# Patient Record
Sex: Male | Born: 1935 | Race: White | Hispanic: No | Marital: Married | Smoking: Never smoker
Health system: Southern US, Community
[De-identification: ages and names within clinical notes are randomized; demographics above are authoritative.]

## PROBLEM LIST (undated history)

## (undated) DIAGNOSIS — J189 Pneumonia, unspecified organism: Secondary | ICD-10-CM

## (undated) DIAGNOSIS — R079 Chest pain, unspecified: Secondary | ICD-10-CM

## (undated) DIAGNOSIS — E785 Hyperlipidemia, unspecified: Secondary | ICD-10-CM

## (undated) DIAGNOSIS — I480 Paroxysmal atrial fibrillation: Secondary | ICD-10-CM

## (undated) DIAGNOSIS — F419 Anxiety disorder, unspecified: Secondary | ICD-10-CM

## (undated) DIAGNOSIS — Z950 Presence of cardiac pacemaker: Secondary | ICD-10-CM

## (undated) DIAGNOSIS — M8580 Other specified disorders of bone density and structure, unspecified site: Secondary | ICD-10-CM

## (undated) DIAGNOSIS — I499 Cardiac arrhythmia, unspecified: Secondary | ICD-10-CM

## (undated) DIAGNOSIS — I2510 Atherosclerotic heart disease of native coronary artery without angina pectoris: Secondary | ICD-10-CM

## (undated) DIAGNOSIS — Z87442 Personal history of urinary calculi: Secondary | ICD-10-CM

## (undated) DIAGNOSIS — N400 Enlarged prostate without lower urinary tract symptoms: Secondary | ICD-10-CM

## (undated) DIAGNOSIS — I209 Angina pectoris, unspecified: Secondary | ICD-10-CM

## (undated) DIAGNOSIS — F32A Depression, unspecified: Secondary | ICD-10-CM

## (undated) HISTORY — DX: Other specified disorders of bone density and structure, unspecified site: M85.80

## (undated) HISTORY — DX: Chest pain, unspecified: R07.9

## (undated) HISTORY — DX: Atherosclerotic heart disease of native coronary artery without angina pectoris: I25.10

## (undated) HISTORY — PX: COLON SURGERY: SHX602

## (undated) HISTORY — DX: Benign prostatic hyperplasia without lower urinary tract symptoms: N40.0

## (undated) HISTORY — DX: Hyperlipidemia, unspecified: E78.5

## (undated) HISTORY — DX: Paroxysmal atrial fibrillation: I48.0

## (undated) HISTORY — PX: TONSILLECTOMY: SUR1361

## (undated) HISTORY — DX: Anxiety disorder, unspecified: F41.9

## (undated) HISTORY — PX: HERNIA REPAIR: SHX51

---

## 1998-12-28 ENCOUNTER — Encounter (INDEPENDENT_AMBULATORY_CARE_PROVIDER_SITE_OTHER): Payer: Self-pay | Admitting: Specialist

## 1998-12-28 ENCOUNTER — Ambulatory Visit (HOSPITAL_COMMUNITY): Admission: RE | Admit: 1998-12-28 | Discharge: 1998-12-28 | Payer: Self-pay | Admitting: Gastroenterology

## 1999-05-15 ENCOUNTER — Ambulatory Visit (HOSPITAL_COMMUNITY): Admission: RE | Admit: 1999-05-15 | Discharge: 1999-05-15 | Payer: Self-pay | Admitting: Gastroenterology

## 1999-05-15 ENCOUNTER — Encounter (INDEPENDENT_AMBULATORY_CARE_PROVIDER_SITE_OTHER): Payer: Self-pay | Admitting: *Deleted

## 2001-07-01 ENCOUNTER — Emergency Department (HOSPITAL_COMMUNITY): Admission: EM | Admit: 2001-07-01 | Discharge: 2001-07-02 | Payer: Self-pay | Admitting: Emergency Medicine

## 2002-05-24 ENCOUNTER — Encounter: Admission: RE | Admit: 2002-05-24 | Discharge: 2002-05-24 | Payer: Self-pay | Admitting: Orthopedic Surgery

## 2002-05-24 ENCOUNTER — Encounter: Payer: Self-pay | Admitting: Orthopedic Surgery

## 2004-11-16 ENCOUNTER — Emergency Department (HOSPITAL_COMMUNITY): Admission: EM | Admit: 2004-11-16 | Discharge: 2004-11-16 | Payer: Self-pay | Admitting: Emergency Medicine

## 2010-01-10 ENCOUNTER — Ambulatory Visit: Payer: Self-pay | Admitting: Sports Medicine

## 2010-01-10 DIAGNOSIS — S838X9A Sprain of other specified parts of unspecified knee, initial encounter: Secondary | ICD-10-CM | POA: Insufficient documentation

## 2010-01-10 DIAGNOSIS — S86819A Strain of other muscle(s) and tendon(s) at lower leg level, unspecified leg, initial encounter: Secondary | ICD-10-CM

## 2010-01-10 DIAGNOSIS — M2170 Unequal limb length (acquired), unspecified site: Secondary | ICD-10-CM | POA: Insufficient documentation

## 2010-02-26 ENCOUNTER — Ambulatory Visit: Payer: Self-pay | Admitting: Sports Medicine

## 2010-04-15 ENCOUNTER — Ambulatory Visit: Payer: Self-pay | Admitting: Sports Medicine

## 2010-04-27 ENCOUNTER — Encounter: Admission: RE | Admit: 2010-04-27 | Discharge: 2010-04-27 | Payer: Self-pay | Admitting: Orthopedic Surgery

## 2010-06-25 NOTE — Assessment & Plan Note (Signed)
Summary: NP,CALF PAIN,RUNNER,MC   Vital Signs:  Patient profile:   76 year old male Height:      70 inches Weight:      152 pounds BMI:     21.89 BP sitting:   109 / 52  Vitals Entered By: Lillia Pauls CMA (January 10, 2010 2:52 PM)  History of Present Illness: Derrick Compton is a 76 year old male runner who started to train for the [facility] marathon in 05/2009. He complains of calf pain in the left lower calf since February. He desribes the pain as throbbing, that begins after running for about 2 miles. He has no pain with walking, or using the elliptical. He desribes two instances in which he felt a "pop" in the lower left calf area, which was associated with pain that eventually improved. He has begun doing calf raises, at the recommendation of his coach. He is currently wearing hard insoles in his running shoes.  Derrick Compton has a recent history of plantar fasciitis, which is healed, and has never had any foot or ankle injuries. He has run 12 marathons in his life.  Derrick Compton has a long history of dyslipidemia, for which he takes a statin.  Allergies (verified): No Known Drug Allergies  Physical Exam  General:  Well-developed,well-nourished,in no acute distress; alert,appropriate and cooperative throughout examination Msk:  Ankle: Right: No tenderness to palpation. Ligaments without significant laxity. Negative talar tilt. 5/5 dorsiflexion, plantarflexion, eversion and inversion. Right: No tenderness to palpation. Ligaments without significant laxity. Negative talar tilt. 5/5 dorsiflexion, plantarflexion, eversion and inversion.  Calf: Left calf with some mild tenderness to palpation over the medial lower-calf. Patient does not note pain with standing plantarflexion with knees straight or bent. He does exhibit significant weakness while doing left sided heel raises, especially with the knee bent.  Right leg is 1cm longer than left. Additional Exam:  Calf Ultrasound:  Left: No areas of  signficant tear identified. Doppler images show area of atherosclerotic change (calcification) in the artery near his pain with an area in which the vessel has split. There is a discrete area of abnormal vessel appearance and possible aneurysmal change.  Normal achilles tendon.  Right: No areas of calf muscle tearing identified. Normal achilles tendon.  Images saved.   Impression & Recommendations:  Problem # 1:  MUSCLE STRAIN, LEFT CALF (ICD-844.8)  Derrick Compton has a history of calf pain while running that is consistent with ultrasound findings of some muscle strain with arterial injury. This may be related to his leg length inequality. We will try him on a calf wrap while running to help relieve the pain. We will also start him on a program of calf strengthening exercises, as he has a significant weakness in the left calf, which must be addressed before he begins to increase his milage.  Use compression over area of injury - given body helix  Orders: [place] LIMITED ([id number]) Garment,belt,sleeve or other covering ,elastic or similar stretch (U0454)  Problem # 2:  UNEQUAL LEG LENGTH (ICD-736.81)  Derrick Compton has his right leg 1cm shorter than his left. The torque created by this inequality while running has likely contributed to his calf injury. We will give him sports insoles to support his relatively high arches and will give a wedge in the left shoe to correct for the leg length inequality.  Orders: Sports Insoles [id number])  Patient Instructions: 1)  do the calf raises with knee straight and knee bent 2)  go down slow and  up steady but more quickly 3)  your left leg is too weak to come up on one leg for first week - so come up on both legs 4)  You have a tear in the bkood vessel going into the medial calf muscle 5)  use a wrap over calf to protect this when exercising 6)  use sports insoles with the lift on left leg 7)  recheck with [place] in 6 weeks 8)  After 1 week of exercise - start some  easy running 9)  run 8 mins and walk 2 mins x 3 for 2 weeks 10)  then run 15 mins and walk 2 mins x 3 for 2 weeks  11)  then come see [place]

## 2010-06-25 NOTE — Assessment & Plan Note (Signed)
Summary: F/U CALF,MC   Vital Signs:  Patient profile:   75 year old male BP sitting:   110 / 63  Vitals Entered By: Lillia Pauls CMA (February 26, 2010 11:01 AM)  History of Present Illness: f/u Calf pain: Left calf pain not much improved since last visit.  Has done calf stretching the strength exercises as directed. Also used calf compression sleves which have not helped much. Continues to experience pain with running.  Continues to use mobic occasionally.  Is frustrated that the calf is not improved by now.   Current Problems (verified): 1)  Unequal Leg Length  (ICD-736.81) 2)  Muscle Strain, Left Calf  (ICD-844.8)  Current Medications (verified): 1)  Nitroglycerin 0.2 Mg/hr Pt24 (Nitroglycerin) .... 1/4 Patch X 24 Hours Once Daily As Directed To Calf  Allergies (verified): No Known Drug Allergies  Review of Systems  The patient denies anorexia, fever, weight loss, chest pain, and abdominal pain.    Physical Exam  General:  VS noted. Well male in NAD Msk:  Ankle: Right: No tenderness to palpation. Ligaments without significant laxity. Negative talar tilt. 5/5 dorsiflexion, plantarflexion, eversion and inversion. Right: No tenderness to palpation. Ligaments without significant laxity. Negative talar tilt. 5/5 dorsiflexion, plantarflexion, eversion and inversion.  Calf: Left calf.  Notes a firm nodule and fiber irregurality along the musclo-tendon juction of the lateral gastroc of the left LE. Is mildly tender to palpation and this is the tender area with exercise.   Right leg is 1cm longer than left.   Impression & Recommendations:  Problem # 1:  MUSCLE STRAIN, LEFT CALF (ICD-844.8) Think nodule may be calcified AVM seen on ukltrasound at last visit. This may be a difficult lesion to heal fully.  Not better with usual conservative therapy.  Plan to try nitroglycerin patch protocal. Will continue exercises as directed at the last visit.  Will follow up in 4-6 weeks.  If  not improved will re-scan then.  Also will consider referral to surgon. May benifit from removal of calcified vessle.  Patient voices understanding.   Complete Medication List: 1)  Nitroglycerin 0.2 Mg/hr Pt24 (Nitroglycerin) .... 1/4 patch x 24 hours once daily as directed to calf Prescriptions: NITROGLYCERIN 0.2 MG/HR PT24 (NITROGLYCERIN) 1/4 patch x 24 hours once daily as directed to calf  #30 x 1   Entered by:   Lillia Pauls CMA   Authorized by:   Enid Baas MD   Signed by:   Lillia Pauls CMA on 02/26/2010   Method used:   Electronically to        [id number] (retail)       [address]       [place], [place]  [address]       Ph: [phone] or [phone]       Fax: [phone]   RxID:   [id number]

## 2010-06-25 NOTE — Assessment & Plan Note (Signed)
Summary: F/U,MC   Vital Signs:  Patient profile:   76 year old male BP sitting:   114 / 60  Vitals Entered By: Rochele Pages RN (April 15, 2010 11:10 AM)  History of Present Illness: 76 y/o M who was training to a marathon and started having some Rt leg pain. An AVM was noted on [place] in a prior visit and the pt was placed on Nitro patches. Today he is still having some pain but it is not in the same area. The pain is now behind the knee and in the upper calf muscle but not on the lateral side. 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 He is now running for 30 min with out pain but says that when he plays golf (not walking the course) he has pain at night when he gets home. Wonders if it's the swinging action from the gold that's causing the problem.   Current Medications (verified): 1)  Meloxicam 7.5 Mg Tabs (Meloxicam) .... Take 1 Pill Daily For Leg Pain and Inflammation.  Allergies (verified): No Known Drug Allergies  Social History: has run many marathons in the past  Physical Exam  General:  Well-developed,well-nourished,in no acute distress; alert,appropriate and cooperative throughout examination Msk:  great flexability,  knee exam shows no effusion; stable ligaments; negative Mcmurray's and provocative meniscal tests; non painful patellar compression; patellar and quadriceps tendons unremarkable.8:54 PM  very slight bulge in the posterior knee.  AT is nontender palpable mass in lower calf has resolved  MSK US  done from achilles to knee. The AVM is resolved, there is no fluid behind his knee. All muscles and tendons appear normal.    Impression & Recommendations:  Problem # 1:  MUSCLE STRAIN, LEFT CALF (ICD-844.8) Assessment Improved  Pt is running 30 min working with [employer] (trainer). Has some posterior knee pain. US done today shows no abnormality. Will ask Thad to do some hamstring strength testing with him. AVM resolved. Pt placed on Mobic 7.5  qDay for pain and inflammation. RTC as needed  will D/C his NTG  Orders: [place] LIMITED ([id number])  Complete Medication List: 1)  Meloxicam 7.5 Mg Tabs (Meloxicam) .... Take 1 pill daily for leg pain and inflammation.  Patient Instructions: 1)  Have Thad check your hamstrings in both the Rt and Left leg for strength.  2)  Your calcified blood vessel is healed so you can stop the nitroglycerine patch.  Prescriptions: MELOXICAM 7.5 MG TABS (MELOXICAM) take 1 pill daily for leg pain and inflammation.  #31 x 3   Entered by:   Jamie Brookes MD   Authorized by:   Enid Baas MD   Signed by:   Jamie Brookes MD on 04/15/2010   Method used:   Electronically to        [id number] (retail)       [address]       [place], [place]  [address]       Ph: [phone] or [phone]       Fax: [phone]   RxID:   [id number]    Orders Added: 1)  Est. Patient Level III [[address]] 2)  [place] LIMITED [[id number]]

## 2010-10-11 NOTE — H&P (Signed)
NAMEMIKHI, ATHEY NO.:  [id number]   MEDICAL RECORD NO.:  [id number]          PATIENT TYPE:  EMS   LOCATION:  ED                           FACILITY:  [facility]   PHYSICIAN:  Maretta Bees. Vonita Moss, M.D.DATE OF BIRTH:  1935/02/07   DATE OF ADMISSION:  11/16/2004  DATE OF DISCHARGE:                                HISTORY & PHYSICAL   HISTORY:  This 76 year old gentleman called me saying that he is having  unrelieved pain from oxycodone.  He saw Dr. Etta Grandchild earlier in the week and a  CT scan showed a 4-mm ureteral stone.  He also has a past history of  prostatitis.  He also has chronic bladder outlet obstructive symptoms but is  not under any therapy for them.  He denied hematuria or fever.  I told him  he needed to go to the emergency room and as it turned out I was in the  emergency room, and he was calling me from the waiting room but never told  me, but I ran into him by chance.   He is in general good health.  He takes Lipitor for hypercholesterolemia.  He takes lorazepam p.r.n. panic attacks.  He has no diabetes or  hypertension.  No cardiac disease.  He has oxycodone for pain with  instructions to take 5 mg every four hours.  He also was on some antibiotics  earlier this week, per Dr. Etta Grandchild.   PHYSICAL EXAMINATION:  GENERAL:  He is alert and oriented in mild distress,  appears stated age.  SKIN:  Warm and dry.  ABDOMEN:  No CVA or bladder tenderness.  No hepatosplenomegaly.   IMPRESSION:  1.  Left ureteral calculus.  He was given 60 mg of Toradol and 8 mg of      morphine IM and sent home with a prescription for more oxycodone to use      5-10 mg every three hours p.r.n. pain.  I also gave him a prescription      for Flomax to help him see if he can pass his stone.  He will return to      see Dr. Etta Grandchild as planned.  2.  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  3.  No fever or dysuria worrisome for acute prostatitis.     _________________    LJP/MEDQ  D:  11/16/2004  T:  11/16/2004  Job:  [id number]

## 2011-02-04 ENCOUNTER — Emergency Department (HOSPITAL_COMMUNITY): Payer: Medicare Other

## 2011-02-04 ENCOUNTER — Inpatient Hospital Stay (HOSPITAL_COMMUNITY)
Admission: EM | Admit: 2011-02-04 | Discharge: 2011-02-11 | DRG: 390 | Disposition: A | Discharge status: Home / Self Care | Payer: Medicare Other | Attending: Internal Medicine | Admitting: Internal Medicine

## 2011-02-04 DIAGNOSIS — K5900 Constipation, unspecified: Secondary | ICD-10-CM | POA: Diagnosis present

## 2011-02-04 DIAGNOSIS — T380X5A Adverse effect of glucocorticoids and synthetic analogues, initial encounter: Secondary | ICD-10-CM | POA: Diagnosis present

## 2011-02-04 DIAGNOSIS — Z85038 Personal history of other malignant neoplasm of large intestine: Secondary | ICD-10-CM

## 2011-02-04 DIAGNOSIS — R109 Unspecified abdominal pain: Secondary | ICD-10-CM | POA: Diagnosis present

## 2011-02-04 DIAGNOSIS — E876 Hypokalemia: Secondary | ICD-10-CM | POA: Diagnosis not present

## 2011-02-04 DIAGNOSIS — I4891 Unspecified atrial fibrillation: Secondary | ICD-10-CM | POA: Diagnosis present

## 2011-02-04 DIAGNOSIS — K644 Residual hemorrhoidal skin tags: Secondary | ICD-10-CM | POA: Diagnosis present

## 2011-02-04 DIAGNOSIS — K648 Other hemorrhoids: Secondary | ICD-10-CM | POA: Diagnosis present

## 2011-02-04 DIAGNOSIS — E785 Hyperlipidemia, unspecified: Secondary | ICD-10-CM | POA: Diagnosis present

## 2011-02-04 DIAGNOSIS — Z9049 Acquired absence of other specified parts of digestive tract: Secondary | ICD-10-CM

## 2011-02-04 DIAGNOSIS — R112 Nausea with vomiting, unspecified: Secondary | ICD-10-CM | POA: Diagnosis present

## 2011-02-04 DIAGNOSIS — D72829 Elevated white blood cell count, unspecified: Secondary | ICD-10-CM | POA: Diagnosis present

## 2011-02-04 DIAGNOSIS — Z87442 Personal history of urinary calculi: Secondary | ICD-10-CM

## 2011-02-04 DIAGNOSIS — K56609 Unspecified intestinal obstruction, unspecified as to partial versus complete obstruction: Secondary | ICD-10-CM

## 2011-02-04 DIAGNOSIS — R079 Chest pain, unspecified: Secondary | ICD-10-CM | POA: Diagnosis present

## 2011-02-04 LAB — DIFFERENTIAL
Basophils Absolute: 0 10*3/uL (ref 0.0–0.1)
Basophils Relative: 0 % (ref 0–1)
Eosinophils Absolute: 0 10*3/uL (ref 0.0–0.7)
Eosinophils Relative: 0 % (ref 0–5)
Lymphocytes Relative: 9 % — ABNORMAL LOW (ref 12–46)
Lymphs Abs: 1.2 10*3/uL (ref 0.7–4.0)
Monocytes Absolute: 1.1 10*3/uL — ABNORMAL HIGH (ref 0.1–1.0)
Monocytes Relative: 8 % (ref 3–12)
Neutro Abs: 12.1 10*3/uL — ABNORMAL HIGH (ref 1.7–7.7)
Neutrophils Relative %: 83 % — ABNORMAL HIGH (ref 43–77)

## 2011-02-04 LAB — URINALYSIS, ROUTINE W REFLEX MICROSCOPIC
Bilirubin Urine: NEGATIVE
Glucose, UA: NEGATIVE mg/dL
Hgb urine dipstick: NEGATIVE
Ketones, ur: NEGATIVE mg/dL
Leukocytes, UA: NEGATIVE
Nitrite: NEGATIVE
Protein, ur: NEGATIVE mg/dL
Specific Gravity, Urine: 1.02 (ref 1.005–1.030)
Urobilinogen, UA: 0.2 mg/dL (ref 0.0–1.0)
pH: 6.5 (ref 5.0–8.0)

## 2011-02-04 LAB — LIPID PANEL
Cholesterol: 196 mg/dL (ref 0–200)
HDL: 63 mg/dL (ref >39)
LDL Cholesterol: 113 mg/dL — ABNORMAL HIGH (ref 0–99)
Total CHOL/HDL Ratio: 3.1 RATIO
Triglycerides: 99 mg/dL (ref <150)
VLDL: 20 mg/dL (ref 0–40)

## 2011-02-04 LAB — CBC
HCT: 42 % (ref 39.0–52.0)
Platelets: 184 10*3/uL (ref 150–400)
RBC: 4.68 MIL/uL (ref 4.22–5.81)
RDW: 12.9 % (ref 11.5–15.5)
WBC: 14.5 10*3/uL — ABNORMAL HIGH (ref 4.0–10.5)

## 2011-02-04 LAB — COMPREHENSIVE METABOLIC PANEL
ALT: 18 U/L (ref 0–53)
AST: 18 U/L (ref 0–37)
Albumin: 3.8 g/dL (ref 3.5–5.2)
Alkaline Phosphatase: 75 U/L (ref 39–117)
BUN: 16 mg/dL (ref 6–23)
CO2: 30 mEq/L (ref 19–32)
Calcium: 9.3 mg/dL (ref 8.4–10.5)
Chloride: 98 mEq/L (ref 96–112)
Creatinine, Ser: 0.76 mg/dL (ref 0.50–1.35)
GFR calc Af Amer: >60 mL/min (ref >60)
GFR calc non Af Amer: >60 mL/min (ref >60)
Glucose, Bld: 109 mg/dL — ABNORMAL HIGH (ref 70–99)
Potassium: 3.5 mEq/L (ref 3.5–5.1)
Sodium: 136 mEq/L (ref 135–145)
Total Bilirubin: 0.6 mg/dL (ref 0.3–1.2)
Total Protein: 6.9 g/dL (ref 6.0–8.3)

## 2011-02-04 LAB — SAMPLE TO BLOOD BANK

## 2011-02-04 LAB — LIPASE, BLOOD: Lipase: 19 U/L (ref 11–59)

## 2011-02-05 ENCOUNTER — Inpatient Hospital Stay (HOSPITAL_COMMUNITY): Payer: Medicare Other

## 2011-02-05 LAB — BASIC METABOLIC PANEL
Calcium: 9.3 mg/dL (ref 8.4–10.5)
GFR calc non Af Amer: >60 mL/min (ref >60)
Glucose, Bld: 139 mg/dL — ABNORMAL HIGH (ref 70–99)
Sodium: 134 mEq/L — ABNORMAL LOW (ref 135–145)

## 2011-02-05 LAB — DIFFERENTIAL
Basophils Relative: 0 % (ref 0–1)
Eosinophils Absolute: 0 10*3/uL (ref 0.0–0.7)
Eosinophils Relative: 0 % (ref 0–5)
Monocytes Absolute: 0.8 10*3/uL (ref 0.1–1.0)
Monocytes Relative: 8 % (ref 3–12)
Neutro Abs: 8.1 10*3/uL — ABNORMAL HIGH (ref 1.7–7.7)

## 2011-02-05 LAB — CBC
Hemoglobin: 14.5 g/dL (ref 13.0–17.0)
MCH: 31.4 pg (ref 26.0–34.0)
MCHC: 34.4 g/dL (ref 30.0–36.0)

## 2011-02-05 LAB — MRSA PCR SCREENING: MRSA by PCR: NEGATIVE

## 2011-02-06 LAB — BASIC METABOLIC PANEL
BUN: 21 mg/dL (ref 6–23)
CO2: 28 mEq/L (ref 19–32)
Chloride: 99 mEq/L (ref 96–112)
Creatinine, Ser: 0.63 mg/dL (ref 0.50–1.35)
GFR calc Af Amer: >60 mL/min (ref >60)

## 2011-02-07 ENCOUNTER — Inpatient Hospital Stay (HOSPITAL_COMMUNITY): Payer: Medicare Other

## 2011-02-07 ENCOUNTER — Encounter (HOSPITAL_COMMUNITY): Payer: Self-pay | Admitting: Radiology

## 2011-02-07 LAB — BASIC METABOLIC PANEL
BUN: 19 mg/dL (ref 6–23)
CO2: 26 mEq/L (ref 19–32)
Calcium: 8.5 mg/dL (ref 8.4–10.5)
Creatinine, Ser: 0.53 mg/dL (ref 0.50–1.35)
Glucose, Bld: 111 mg/dL — ABNORMAL HIGH (ref 70–99)

## 2011-02-07 LAB — CBC
HCT: 36.9 % — ABNORMAL LOW (ref 39.0–52.0)
Hemoglobin: 12.3 g/dL — ABNORMAL LOW (ref 13.0–17.0)
MCH: 30.7 pg (ref 26.0–34.0)
MCV: 92 fL (ref 78.0–100.0)
RBC: 4.01 MIL/uL — ABNORMAL LOW (ref 4.22–5.81)

## 2011-02-07 MED ORDER — IOHEXOL 300 MG/ML  SOLN
100.0000 mL | Freq: Once | INTRAMUSCULAR | Status: AC | PRN
  Administered 2011-02-07: 100 mL via INTRAVENOUS

## 2011-02-08 LAB — CBC
MCH: 31.7 pg (ref 26.0–34.0)
MCV: 89.2 fL (ref 78.0–100.0)
Platelets: 193 10*3/uL (ref 150–400)
RDW: 12.9 % (ref 11.5–15.5)

## 2011-02-08 LAB — BASIC METABOLIC PANEL
BUN: 12 mg/dL (ref 6–23)
Chloride: 97 mEq/L (ref 96–112)
GFR calc Af Amer: >60 mL/min (ref >60)
Potassium: 2.9 mEq/L — ABNORMAL LOW (ref 3.5–5.1)

## 2011-02-09 LAB — BASIC METABOLIC PANEL
CO2: 28 mEq/L (ref 19–32)
Chloride: 100 mEq/L (ref 96–112)
Creatinine, Ser: 0.65 mg/dL (ref 0.50–1.35)
Sodium: 136 mEq/L (ref 135–145)

## 2011-02-09 LAB — CBC
MCV: 89.2 fL (ref 78.0–100.0)
Platelets: 213 10*3/uL (ref 150–400)
RBC: 4.34 MIL/uL (ref 4.22–5.81)
WBC: 5.6 10*3/uL (ref 4.0–10.5)

## 2011-02-09 LAB — MAGNESIUM: Magnesium: 2.3 mg/dL (ref 1.5–2.5)

## 2011-02-10 LAB — BASIC METABOLIC PANEL
CO2: 28 mEq/L (ref 19–32)
Chloride: 99 mEq/L (ref 96–112)
Potassium: 3.7 mEq/L (ref 3.5–5.1)
Sodium: 137 mEq/L (ref 135–145)

## 2011-02-11 LAB — BASIC METABOLIC PANEL
CO2: 26 mEq/L (ref 19–32)
Glucose, Bld: 108 mg/dL — ABNORMAL HIGH (ref 70–99)
Potassium: 4.3 mEq/L (ref 3.5–5.1)
Sodium: 135 mEq/L (ref 135–145)

## 2011-02-12 NOTE — Op Note (Signed)
  NAMETYLAR, AMBORN NO.:  [id number]  MEDICAL RECORD NO.:  [id number]  LOCATION:  1437                         FACILITY:  [facility]  PHYSICIAN:  Petra Kuba, M.D.    DATE OF BIRTH:  September 08, 1934  DATE OF PROCEDURE: DATE OF DISCHARGE:                              OPERATIVE REPORT   PROCEDURE:  Colonoscopy.  INDICATION:  The patient with a history of colon cancer with questionable obstruction.  Consent was signed after risks, benefits, methods, options thoroughly discussed multiple times in the past.  MEDICINES USED:  Fentanyl 100 mcg, Versed 10 mg, Benadryl 12.5 mg.  PROCEDURE IN DETAIL:  Rectal inspection is pertinent for external hemorrhoids, small.  Digital exam was negative.  The video colonoscope was inserted and despite some tortuosity and a long looping colon, we were able to advance to the anastomosis and into the terminal ileum. There were no signs of blockage, mass, lesions, or abnormalities.  The TI was normal.  We were able to advance between 15 to 20 cm up the terminal ileum.  The scope was slowly withdrawn.  They had a small bowel to colon side anastomosis.  The colon end anastomosis was normal.  The scope was further withdrawn.  The prep was adequate.  There was some liquid stool that required washing and suctioning and slow withdrawal through the colon.  No polyps, tumors, masses, or diverticula were seen as we slowly withdrew back to the rectum.  Anorectal pull-through and retroflexion confirmed some tiny hemorrhoids.  Scope was straightened and re-advanced towards at the left side of the colon.  Air was suctioned.  Scope removed.  The patient tolerated the procedure well. There was no obvious immediate complication.  ENDOSCOPIC DIAGNOSES: 1. Internal-external tiny hemorrhoids. 2. Otherwise within normal limits to the anastomosis and into the     terminal ileum. 3. No signs of obstruction or abnormality.  PLAN:  Follow up with Dr. Matthias Hughs  in 1-2 weeks just to make sure no further workup plans are needed, can go home from my standpoint if able to advance diet.  Repeat colon screening in 5 years from now if doing well medically.          ______________________________ Petra Kuba, M.D.     MEM/MEDQ  D:  02/10/2011  T:  02/10/2011  Job:  [id number]  cc:   Lynelle Smoke I. Patsi Sears, M.D. Fax: [phone]  Elana Alm. Thurston Hole, M.D. Fax: [phone]  Bernette Redbird, M.D. Fax: [phone]  Electronically Signed by Vida Rigger M.D. on 02/12/2011 02:58:07 PM
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
## 2011-02-27 NOTE — H&P (Signed)
  NAMEJEMEL, ONO NO.:  [id number]  MEDICAL RECORD NO.:  [id number]  LOCATION:  WLED                         FACILITY:  [facility]  PHYSICIAN:  Ramiro Harvest, MD    DATE OF BIRTH:  June 29, 1934  DATE OF ADMISSION:  02/04/2011 DATE OF DISCHARGE:                             HISTORY & PHYSICAL   ADDENDUM:  Leukocytosis on the assessment and plan likely secondary to recent steroid use versus reactive.  Urinalysis is negative.  Chest x- ray is negative for any infiltrate.  The patient is afebrile.  No need for antibiotics at this time. We will monitor.  It has been a pleasure taking care of Mr. Ingvald Theisen.     Ramiro Harvest, MD     DT/MEDQ  D:  02/04/2011  T:  02/04/2011  Job:  [id number]  Electronically Signed by Ramiro Harvest MD on 02/27/2011 12:34:53 PM
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
## 2011-05-07 DIAGNOSIS — Z8679 Personal history of other diseases of the circulatory system: Secondary | ICD-10-CM | POA: Insufficient documentation

## 2011-05-07 DIAGNOSIS — Z9889 Other specified postprocedural states: Secondary | ICD-10-CM | POA: Insufficient documentation

## 2011-05-07 DIAGNOSIS — H18519 Endothelial corneal dystrophy, unspecified eye: Secondary | ICD-10-CM | POA: Insufficient documentation

## 2011-12-08 DIAGNOSIS — Z947 Corneal transplant status: Secondary | ICD-10-CM | POA: Insufficient documentation

## 2014-02-28 ENCOUNTER — Encounter (HOSPITAL_BASED_OUTPATIENT_CLINIC_OR_DEPARTMENT_OTHER): Payer: Self-pay | Admitting: Emergency Medicine

## 2014-02-28 ENCOUNTER — Emergency Department (HOSPITAL_BASED_OUTPATIENT_CLINIC_OR_DEPARTMENT_OTHER)
Admission: EM | Admit: 2014-02-28 | Discharge: 2014-02-28 | Discharge status: Against Medical Advice | Payer: Medicare Other | Attending: Emergency Medicine | Admitting: Emergency Medicine

## 2014-02-28 DIAGNOSIS — F419 Anxiety disorder, unspecified: Secondary | ICD-10-CM | POA: Diagnosis present

## 2014-02-28 DIAGNOSIS — R110 Nausea: Secondary | ICD-10-CM | POA: Insufficient documentation

## 2014-02-28 NOTE — ED Notes (Addendum)
Pt c/o " nervousness" and nausea.  X 1 day increased stress at work

## 2014-11-28 DIAGNOSIS — Z87448 Personal history of other diseases of urinary system: Secondary | ICD-10-CM | POA: Insufficient documentation

## 2014-11-28 DIAGNOSIS — N401 Enlarged prostate with lower urinary tract symptoms: Secondary | ICD-10-CM | POA: Insufficient documentation

## 2014-11-28 DIAGNOSIS — N200 Calculus of kidney: Secondary | ICD-10-CM | POA: Insufficient documentation

## 2016-04-23 DIAGNOSIS — F419 Anxiety disorder, unspecified: Secondary | ICD-10-CM | POA: Insufficient documentation

## 2016-04-23 DIAGNOSIS — N400 Enlarged prostate without lower urinary tract symptoms: Secondary | ICD-10-CM | POA: Insufficient documentation

## 2016-04-23 DIAGNOSIS — Z974 Presence of external hearing-aid: Secondary | ICD-10-CM | POA: Insufficient documentation

## 2016-04-23 DIAGNOSIS — H9191 Unspecified hearing loss, right ear: Secondary | ICD-10-CM | POA: Insufficient documentation

## 2016-10-13 ENCOUNTER — Telehealth: Payer: Self-pay | Admitting: Cardiovascular Disease

## 2016-10-13 NOTE — Telephone Encounter (Signed)
Received records from [facility] for appointment on 10/28/16 with Dr Gwenlyn Found.  Records put with Dr Kennon Holter schedule for 10/28/16. lp

## 2016-10-22 DIAGNOSIS — E785 Hyperlipidemia, unspecified: Secondary | ICD-10-CM | POA: Insufficient documentation

## 2016-10-22 DIAGNOSIS — H903 Sensorineural hearing loss, bilateral: Secondary | ICD-10-CM | POA: Insufficient documentation

## 2016-10-22 DIAGNOSIS — C189 Malignant neoplasm of colon, unspecified: Secondary | ICD-10-CM | POA: Insufficient documentation

## 2016-10-28 ENCOUNTER — Ambulatory Visit (INDEPENDENT_AMBULATORY_CARE_PROVIDER_SITE_OTHER): Payer: Medicare Other | Admitting: Cardiovascular Disease

## 2016-10-28 ENCOUNTER — Encounter: Payer: Self-pay | Admitting: Cardiovascular Disease

## 2016-10-28 VITALS — BP 116/62 | HR 88 | Ht 70.0 in | Wt 156.0 lb

## 2016-10-28 DIAGNOSIS — I209 Angina pectoris, unspecified: Secondary | ICD-10-CM

## 2016-10-28 DIAGNOSIS — R0789 Other chest pain: Secondary | ICD-10-CM

## 2016-10-28 DIAGNOSIS — R079 Chest pain, unspecified: Secondary | ICD-10-CM | POA: Insufficient documentation

## 2016-10-28 DIAGNOSIS — E7800 Pure hypercholesterolemia, unspecified: Secondary | ICD-10-CM

## 2016-10-28 DIAGNOSIS — I208 Other forms of angina pectoris: Secondary | ICD-10-CM

## 2016-10-28 NOTE — Progress Notes (Signed)
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 He was referred by the [facility] clinic for cardiovascular evaluation because of proximity and progressive chest pain. This with Dr. really is hyperlipidemia. He did have a coronary calcium score performed 10/04/15 which was 181 a subsequent GXT which was normal. He exercises frequently has noticed progressive substernal chest pain with running.   Current Outpatient Prescriptions  Medication Sig Dispense Refill  . ALPRAZolam (XANAX) 0.5 MG tablet Take 0.5 mg by mouth at bedtime as needed.  1  . LORazepam (ATIVAN) 0.5 MG tablet Take 0.5 mg by mouth every 8 (eight) hours.    . meclizine (ANTIVERT) 25 MG tablet Take 25 mg by mouth as needed.  0  . rosuvastatin (CRESTOR) 40 MG tablet Take 0.5 tablets by mouth daily.  3  . sertraline (ZOLOFT) 100 MG tablet Take 100 mg by mouth daily.  1  . valACYclovir (VALTREX) 1000 MG tablet Take 1,000 mg by mouth daily.  5   No current facility-administered medications for this visit.     Allergies  Allergen Reactions  . Prednisolone Palpitations    Social History   Social History  . Marital status: Married    Spouse name: N/A  . Number of children: N/A  . Years of education: N/A   Occupational History  . Not on file.   Social History Main Topics  . Smoking status: Never Smoker  . Smokeless tobacco: Never Used  . Alcohol use No  . Drug use: No  . Sexual activity: Not on file   Other Topics Concern  . Not on file   Social History Narrative  . No narrative on file     Review of Systems: General: negative for chills, fever, night sweats or weight changes.    Cardiovascular: negative for chest pain, dyspnea on exertion, edema, orthopnea, palpitations, paroxysmal nocturnal dyspnea or shortness of breath Dermatological: negative for rash Respiratory: negative for cough or wheezing Urologic: negative for hematuria Abdominal: negative for nausea, vomiting, diarrhea, bright red blood per rectum, melena, or hematemesis Neurologic: negative for visual changes, syncope, or dizziness All other systems reviewed and are otherwise negative except as noted above.    Blood pressure 116/62, pulse 88, height 5\' 10"  (1.778 m), weight 156 lb (70.8 kg), SpO2 97 %.  General appearance: alert and no distress Neck: no adenopathy, no carotid bruit, no JVD, supple, symmetrical, trachea midline and thyroid not enlarged, symmetric, no tenderness/mass/nodules Lungs: clear to auscultation bilaterally Heart: regular rate and rhythm, S1, S2 normal, no murmur, click, rub or gallop Extremities: extremities normal, atraumatic, no cyanosis or edema  EKG not performed today  ASSESSMENT AND PLAN:   Hyperlipidemia History of hyperlipidemia on statin therapy with lipid profile performed 10/04/15  with total cholesterol of 198, LDL 118 and HDL of 61.  Chest pain Derrick Compton is referred for evaluation of progressive exertional chest pain. He did have a coronary calcium score performed at the [place] clinic on 10/04/15 of 181. A subsequent routine GXT was normal. He does exercise frequently has noted progressive substernal chest pain with running which gradually subsides on its own. Based on this I'm going to order an exercise Myoview stress test to  risk stratify him.      Lorretta Harp MD FACP,FACC,FAHA, [facility] 10/28/2016 9:03 AM

## 2016-10-28 NOTE — Assessment & Plan Note (Signed)
Derrick Compton is referred for evaluation of progressive exertional chest pain. He did have a coronary calcium score performed at the [facility] on 10/04/15 of 181. A subsequent routine GXT was normal. He does exercise frequently has noted progressive substernal chest pain with running which gradually subsides on its own. Based on this I'm going to order an exercise Myoview stress test to risk stratify him.

## 2016-10-28 NOTE — Patient Instructions (Signed)
Medication Instructions: Your physician recommends that you continue on your current medications as directed. Please refer to the Current Medication list given to you today.   Testing/Procedures: Your physician has requested that you have an exercise stress myoview. For further information please visit [email or web address]. Please follow instruction sheet, as given.  Follow-Up: Your physician wants you to follow-up in: 6 months with Dr. Gwenlyn Found. You will receive a reminder letter in the mail two months in advance. If you don't receive a letter, please call our office to schedule the follow-up appointment.  If you need a refill on your cardiac medications before your next appointment, please call your pharmacy.    Exercise Stress Electrocardiogram An exercise stress electrocardiogram is a test to check how blood flows to your heart. It is done to find areas of poor blood flow. You will need to walk on a treadmill for this test. The electrocardiogram will record your heartbeat when you are at rest and when you are exercising. What happens before the procedure?  Do not have drinks with caffeine or foods with caffeine for 24 hours before the test, or as told by your doctor. This includes coffee, tea (even decaf tea), sodas, chocolate, and cocoa.  Follow your doctor's instructions about eating and drinking before the test.  Ask your doctor what medicines you should or should not take before the test. Take your medicines with water unless told by your doctor not to.  If you use an inhaler, bring it with you to the test.  Bring a snack to eat after the test.  Do not  smoke for 4 hours before the test.  Do not put lotions, powders, creams, or oils on your chest before the test.  Wear comfortable shoes and clothing. What happens during the procedure?  You will have patches put on your chest. Small areas of your chest may need to be shaved. Wires will be connected to the patches.  Your heart  rate will be watched while you are resting and while you are exercising.  You will walk on the treadmill. The treadmill will slowly get faster to raise your heart rate.  The test will take about 1-2 hours. What happens after the procedure?  Your heart rate and blood pressure will be watched after the test.  You may return to your normal diet, activities, and medicines or as told by your doctor. This information is not intended to replace advice given to you by your health care provider. Make sure you discuss any questions you have with your health care provider. Document Released: 10/29/2007 Document Revised: 01/09/2016 Document Reviewed: 01/17/2013 Elsevier Interactive Patient Education  [employer].

## 2016-10-28 NOTE — Assessment & Plan Note (Signed)
History of hyperlipidemia on statin therapy with lipid profile performed 10/04/15  with total cholesterol of 198, LDL 118 and HDL of 61.

## 2016-11-04 ENCOUNTER — Inpatient Hospital Stay (HOSPITAL_COMMUNITY): Admission: RE | Admit: 2016-11-04 | Payer: Medicare Other | Source: Ambulatory Visit

## 2016-11-06 ENCOUNTER — Telehealth (HOSPITAL_COMMUNITY): Payer: Self-pay

## 2016-11-06 NOTE — Telephone Encounter (Signed)
Encounter complete. 

## 2016-11-11 ENCOUNTER — Ambulatory Visit (HOSPITAL_COMMUNITY)
Admission: RE | Admit: 2016-11-11 | Discharge: 2016-11-11 | Disposition: A | Discharge status: Home / Self Care | Payer: Medicare Other | Source: Ambulatory Visit | Attending: Cardiovascular Disease | Admitting: Cardiovascular Disease

## 2016-11-11 DIAGNOSIS — R0789 Other chest pain: Secondary | ICD-10-CM

## 2016-11-11 DIAGNOSIS — R079 Chest pain, unspecified: Secondary | ICD-10-CM | POA: Diagnosis present

## 2016-11-11 LAB — MYOCARDIAL PERFUSION IMAGING
CHL CUP NUCLEAR SSS: 0
LV sys vol: 71 mL
LVDIAVOL: 148 mL (ref 62–150)
NUC STRESS TID: 1.07
Peak HR: 50 {beats}/min
Rest HR: 39 {beats}/min
SDS: 0
SRS: 0

## 2016-11-11 MED ORDER — REGADENOSON 0.4 MG/5ML IV SOLN
0.4000 mg | Freq: Once | INTRAVENOUS | Status: AC
  Administered 2016-11-11: 0.4 mg via INTRAVENOUS

## 2016-11-11 MED ORDER — TECHNETIUM TC 99M TETROFOSMIN IV KIT
29.7000 | PACK | Freq: Once | INTRAVENOUS | Status: AC | PRN
  Administered 2016-11-11: 29.7 via INTRAVENOUS
  Filled 2016-11-11: qty 30

## 2016-11-11 MED ORDER — TECHNETIUM TC 99M TETROFOSMIN IV KIT
10.6000 | PACK | Freq: Once | INTRAVENOUS | Status: AC | PRN
  Administered 2016-11-11: 10.6 via INTRAVENOUS
  Filled 2016-11-11: qty 11

## 2016-11-16 NOTE — Progress Notes (Signed)
Have him come in to see me next week

## 2016-11-21 ENCOUNTER — Ambulatory Visit (INDEPENDENT_AMBULATORY_CARE_PROVIDER_SITE_OTHER): Payer: Medicare Other | Admitting: Cardiovascular Disease

## 2016-11-21 ENCOUNTER — Encounter: Payer: Self-pay | Admitting: Cardiovascular Disease

## 2016-11-21 DIAGNOSIS — I208 Other forms of angina pectoris: Secondary | ICD-10-CM

## 2016-11-21 DIAGNOSIS — I209 Angina pectoris, unspecified: Secondary | ICD-10-CM

## 2016-11-21 NOTE — Assessment & Plan Note (Signed)
Derrick Compton continues to have reproducible exertional substernal chest pain. He says that he's had "leakage in his heart" in the past. I'm going to get a 2-D echocardiogram" coronary CTA and we'll see him back after that for further evaluation.

## 2016-11-21 NOTE — Progress Notes (Signed)
Derrick Compton continues to have reproducible exertional substernal chest pain. He says that he's had "leakage in his heart" in the past. I'm going to get a 2-D echocardiogram" coronary CTA and we'll see him back after that for further evaluation.

## 2016-11-21 NOTE — Patient Instructions (Signed)
Medication Instructions: Your physician recommends that you continue on your current medications as directed. Please refer to the Current Medication list given to you today.  Labwork: Your physician recommends that you return for lab work--BMET   Testing/Procedures: Your physician has requested that you have an echocardiogram. Echocardiography is a painless test that uses sound waves to create images of your heart. It provides your doctor with information about the size and shape of your heart and how well your heart's chambers and valves are working. This procedure takes approximately one hour. There are no restrictions for this procedure.  Coronary CT Angiography (CTA), is a special type of CT scan that uses a computer to produce multi-dimensional views of major blood vessels throughout the body. In CT angiography, a contrast material is injected through an IV to help visualize the blood vessels   Follow-Up: Your physician recommends that you schedule a follow-up appointment with Dr. Gwenlyn Found after testing.  If you need a refill on your cardiac medications before your next appointment, please call your pharmacy.

## 2016-11-21 NOTE — Addendum Note (Signed)
Addended by: Zebedee Iba on: 11/21/2016 04:28 PM   Modules accepted: Orders

## 2016-11-25 ENCOUNTER — Encounter: Payer: Self-pay | Admitting: Cardiovascular Disease

## 2016-12-04 ENCOUNTER — Other Ambulatory Visit (HOSPITAL_COMMUNITY): Payer: Medicare Other

## 2016-12-09 ENCOUNTER — Ambulatory Visit (HOSPITAL_BASED_OUTPATIENT_CLINIC_OR_DEPARTMENT_OTHER): Payer: Medicare Other

## 2016-12-09 ENCOUNTER — Other Ambulatory Visit: Payer: Medicare Other

## 2016-12-09 ENCOUNTER — Encounter: Payer: Self-pay | Admitting: Cardiovascular Disease

## 2016-12-09 ENCOUNTER — Other Ambulatory Visit: Payer: Self-pay

## 2016-12-09 DIAGNOSIS — I082 Rheumatic disorders of both aortic and tricuspid valves: Secondary | ICD-10-CM | POA: Diagnosis not present

## 2016-12-09 DIAGNOSIS — I208 Other forms of angina pectoris: Secondary | ICD-10-CM

## 2016-12-09 DIAGNOSIS — E785 Hyperlipidemia, unspecified: Secondary | ICD-10-CM | POA: Diagnosis not present

## 2016-12-10 ENCOUNTER — Ambulatory Visit (HOSPITAL_COMMUNITY)
Admission: RE | Admit: 2016-12-10 | Discharge: 2016-12-10 | Disposition: A | Discharge status: Home / Self Care | Payer: Medicare Other | Source: Ambulatory Visit | Attending: Cardiovascular Disease | Admitting: Cardiovascular Disease

## 2016-12-10 DIAGNOSIS — I208 Other forms of angina pectoris: Secondary | ICD-10-CM

## 2016-12-10 DIAGNOSIS — I082 Rheumatic disorders of both aortic and tricuspid valves: Secondary | ICD-10-CM | POA: Insufficient documentation

## 2016-12-10 DIAGNOSIS — E785 Hyperlipidemia, unspecified: Secondary | ICD-10-CM | POA: Insufficient documentation

## 2016-12-10 LAB — BASIC METABOLIC PANEL
BUN/Creatinine Ratio: 19 (ref 10–24)
BUN: 17 mg/dL (ref 8–27)
CHLORIDE: 100 mmol/L (ref 96–106)
CO2: 26 mmol/L (ref 20–29)
Calcium: 9.6 mg/dL (ref 8.6–10.2)
Creatinine, Ser: 0.9 mg/dL (ref 0.76–1.27)
GFR, EST AFRICAN AMERICAN: 94 mL/min/{1.73_m2} (ref >59)
GFR, EST NON AFRICAN AMERICAN: 81 mL/min/{1.73_m2} (ref >59)
Glucose: 101 mg/dL — ABNORMAL HIGH (ref 65–99)
POTASSIUM: 4.3 mmol/L (ref 3.5–5.2)
SODIUM: 142 mmol/L (ref 134–144)

## 2016-12-10 MED ORDER — IOPAMIDOL (ISOVUE-370) INJECTION 76%
INTRAVENOUS | Status: AC
  Filled 2016-12-10: qty 100

## 2016-12-10 MED ORDER — IOPAMIDOL (ISOVUE-300) INJECTION 61%
INTRAVENOUS | Status: AC
  Filled 2016-12-10: qty 75

## 2016-12-11 ENCOUNTER — Other Ambulatory Visit: Payer: Self-pay | Admitting: *Deleted

## 2016-12-11 ENCOUNTER — Encounter: Payer: Self-pay | Admitting: Cardiovascular Disease

## 2016-12-11 DIAGNOSIS — I208 Other forms of angina pectoris: Secondary | ICD-10-CM

## 2016-12-18 ENCOUNTER — Ambulatory Visit (HOSPITAL_COMMUNITY): Payer: Medicare Other

## 2016-12-24 ENCOUNTER — Ambulatory Visit (HOSPITAL_COMMUNITY)
Admission: RE | Admit: 2016-12-24 | Discharge: 2016-12-24 | Disposition: A | Discharge status: Home / Self Care | Payer: Medicare Other | Source: Ambulatory Visit | Attending: Cardiovascular Disease | Admitting: Cardiovascular Disease

## 2016-12-24 ENCOUNTER — Encounter (HOSPITAL_COMMUNITY): Payer: Self-pay

## 2016-12-24 DIAGNOSIS — I208 Other forms of angina pectoris: Secondary | ICD-10-CM

## 2016-12-24 DIAGNOSIS — I2510 Atherosclerotic heart disease of native coronary artery without angina pectoris: Secondary | ICD-10-CM | POA: Diagnosis not present

## 2016-12-24 DIAGNOSIS — R079 Chest pain, unspecified: Secondary | ICD-10-CM | POA: Diagnosis not present

## 2016-12-24 MED ORDER — IOPAMIDOL (ISOVUE-370) INJECTION 76%
INTRAVENOUS | Status: AC
  Administered 2016-12-24: 80 mL
  Filled 2016-12-24: qty 100

## 2016-12-24 MED ORDER — NITROGLYCERIN 0.4 MG SL SUBL: 0.8000 mg | SUBLINGUAL_TABLET | Freq: Once | SUBLINGUAL | Status: DC

## 2016-12-24 MED ORDER — NITROGLYCERIN 0.4 MG SL SUBL
SUBLINGUAL_TABLET | SUBLINGUAL | Status: AC
  Filled 2016-12-24: qty 2

## 2017-01-05 ENCOUNTER — Encounter: Payer: Self-pay | Admitting: Cardiovascular Disease

## 2017-01-16 ENCOUNTER — Encounter: Payer: Self-pay | Admitting: Cardiovascular Disease

## 2017-01-16 ENCOUNTER — Ambulatory Visit (INDEPENDENT_AMBULATORY_CARE_PROVIDER_SITE_OTHER): Payer: Medicare Other | Admitting: Cardiovascular Disease

## 2017-01-16 VITALS — BP 113/60 | HR 60 | Ht 71.0 in | Wt 161.0 lb

## 2017-01-16 DIAGNOSIS — I350 Nonrheumatic aortic (valve) stenosis: Secondary | ICD-10-CM

## 2017-01-16 DIAGNOSIS — I209 Angina pectoris, unspecified: Secondary | ICD-10-CM

## 2017-01-16 DIAGNOSIS — E7800 Pure hypercholesterolemia, unspecified: Secondary | ICD-10-CM | POA: Diagnosis not present

## 2017-01-16 DIAGNOSIS — I208 Other forms of angina pectoris: Secondary | ICD-10-CM

## 2017-01-16 NOTE — Assessment & Plan Note (Signed)
History of hyperlipidemia on statin therapy followed by his PCP 

## 2017-01-16 NOTE — Assessment & Plan Note (Signed)
Derrick Compton she was to have atypical chest pain which has been exertional component. His Myoview stress test was normal over his coronary CTA and CT FFR appeared to show a significant diagonal branch stenosis. Based on this we decided to proceed with outpatient radial diagnostic coronary angiography to further define his anatomy.The patient understands that risks included but are not limited to stroke (1 in 1000), death (1 in 2), kidney failure [usually temporary] (1 in 500), bleeding (1 in 200), allergic reaction [possibly serious] (1 in 200). The patient understands and agrees to proceed

## 2017-01-16 NOTE — Assessment & Plan Note (Signed)
History of mild aortic stenosis with recent echo performed 12/09/16 revealing a mean aortic valve gradient of 11 mmHg with a peak of 21 mm and normal ejection fraction. We will continue to follow on an annual basis.

## 2017-01-16 NOTE — Patient Instructions (Signed)
   [facility] [address] [place] [address] Dept: [phone] Loc: [phone]  Derrick Compton  01/16/2017  You are scheduled for a Cardiac Catheterization on Thursday, September 13 with Dr. Quay Burow.  1. Please arrive at the [facility] (Main Entrance A) at [facility]: [address] [place], [place] [address] at 11:00 AM (two hours before your procedure to ensure your preparation). Free valet parking service is available.   Special note: Every effort is made to have your procedure done on time. Please understand that emergencies sometimes delay scheduled procedures.  2. Diet: Do not eat or drink anything after midnight prior to your procedure except sips of water to take medications.  3. Labs: You will need to have blood drawn on Monday, September 3 in our office. No appointment needed. You do NOT need to be fasting.  4. Medication instructions in preparation for your procedure:  On the morning of your procedure, take any morning medicines.  You may use sips of water.  5. Plan for one night stay--bring personal belongings. 6. Bring a current list of your medications and current insurance cards. 7. You MUST have a responsible person to drive you home. 8. Someone MUST be with you the first 24 hours after you arrive home or your discharge will be delayed. 9. Please wear clothes that are easy to get on and off and wear slip-on shoes.  Thank you for allowing [place] to care for you!   -- [facility]

## 2017-01-16 NOTE — Progress Notes (Signed)
01/16/2017 Derrick Compton   04/11/1935  [id number]  Primary Physician Patient, No Pcp Per Primary Cardiologist: Lorretta Harp MD Renae Gloss  HPI:  Derrick Compton is a 82 y.o. 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 He was referred by the [facility] clinic for cardiovascular evaluation because of proximity and progressive chest pain. I last saw him in the office 11/21/16. His only cardiovascular risk factor is hyperlipidemia. He did have a coronary calcium score performed 10/04/15 which was 181 a subsequent GXT which was normal. He exercises frequently has noticed progressive substernal chest pain with running. I perform Myoview stress testing on 11/11/16 which was entirely normal 2-D echo performed 12/09/16 showed mild aortic stenosis. He studies continued symptoms I ordered a coronary CTA/FFR on 12/24/16 showed a significant lesion in a diagonal branch.    Current Meds  Medication Sig  . ALPRAZolam (XANAX) 0.5 MG tablet Take 0.5 mg by mouth at bedtime as needed.  [name] LORazepam (ATIVAN) 0.5 MG tablet Take 0.5 mg by mouth every 8 (eight) hours.  . meclizine (ANTIVERT) 25 MG tablet Take 25 mg by mouth as needed.  . rosuvastatin (CRESTOR) 40 MG tablet Take 0.5 tablets by mouth daily.  . sertraline (ZOLOFT) 100 MG tablet Take 100 mg by mouth daily.  . valACYclovir (VALTREX) 1000 MG tablet Take 1,000 mg by mouth daily.     No Known Allergies  Social History   Social History  . Marital status: Married    Spouse name: N/A  . Number of children: N/A  . Years of education: N/A   Occupational History  . Not on file.   Social History Main Topics  . Smoking status: Never Smoker  . Smokeless tobacco: Never Used  . Alcohol use No  . Drug use: No  . Sexual activity: Not on file   Other Topics Concern  . Not on file   Social History  Narrative  . No narrative on file     Review of Systems: General: negative for chills, fever, night sweats or weight changes.  Cardiovascular: negative for chest pain, dyspnea on exertion, edema, orthopnea, palpitations, paroxysmal nocturnal dyspnea or shortness of breath Dermatological: negative for rash Respiratory: negative for cough or wheezing Urologic: negative for hematuria Abdominal: negative for nausea, vomiting, diarrhea, bright red blood per rectum, melena, or hematemesis Neurologic: negative for visual changes, syncope, or dizziness All other systems reviewed and are otherwise negative except as noted above.    Blood pressure 113/60, pulse 60, height 5\' 11"  (1.803 m), weight 161 lb (73 kg), SpO2 97 %.  General appearance: alert and no distress Neck: no adenopathy, no carotid bruit, no JVD, supple, symmetrical, trachea midline and thyroid not enlarged, symmetric, no tenderness/mass/nodules Lungs: clear to auscultation bilaterally Heart: Soft outflow tract murmur consistent with aortic stenosis Extremities: Soft outflow tract murmur consistent with aortic stenosis  EKG not performed today  ASSESSMENT AND PLAN:   Hyperlipidemia History of hyperlipidemia on statin therapy followed by his PCP  Aortic stenosis, mild History of mild aortic stenosis with recent echo performed 12/09/16 revealing a mean aortic valve gradient of 11 mmHg with a peak of 21 mm and normal ejection fraction. We will continue to follow on an annual basis.  Chest pain Derrick Compton she was to have atypical chest pain which has been exertional component. His Myoview stress test was normal over  his coronary CTA and CT FFR appeared to show a significant diagonal branch stenosis. Based on this we decided to proceed with outpatient radial diagnostic coronary angiography to further define his anatomy.The patient understands that risks included but are not limited to stroke (1 in 1000), death (1 in 61), kidney  failure [usually temporary] (1 in 500), bleeding (1 in 200), allergic reaction [possibly serious] (1 in 200). The patient understands and agrees to proceed      Lorretta Harp MD [facility], [facility] 01/16/2017 9:20 AM

## 2017-01-27 LAB — CBC WITH DIFFERENTIAL/PLATELET
BASOS: 1 %
Basophils Absolute: 0 10*3/uL (ref 0.0–0.2)
EOS (ABSOLUTE): 0.2 10*3/uL (ref 0.0–0.4)
EOS: 3 %
Hematocrit: 43.4 % (ref 37.5–51.0)
Hemoglobin: 14.7 g/dL (ref 13.0–17.7)
IMMATURE GRANS (ABS): 0 10*3/uL (ref 0.0–0.1)
IMMATURE GRANULOCYTES: 0 %
LYMPHS: 29 %
Lymphocytes Absolute: 1.6 10*3/uL (ref 0.7–3.1)
MCH: 31.3 pg (ref 26.6–33.0)
MCHC: 33.9 g/dL (ref 31.5–35.7)
MCV: 93 fL (ref 79–97)
MONOS ABS: 0.4 10*3/uL (ref 0.1–0.9)
Monocytes: 7 %
NEUTROS ABS: 3.5 10*3/uL (ref 1.4–7.0)
NEUTROS PCT: 60 %
Platelets: 205 10*3/uL (ref 150–379)
RBC: 4.69 x10E6/uL (ref 4.14–5.80)
RDW: 13.3 % (ref 12.3–15.4)
WBC: 5.7 10*3/uL (ref 3.4–10.8)

## 2017-01-27 LAB — PROTIME-INR
INR: 1.1 (ref 0.8–1.2)
PROTHROMBIN TIME: 10.9 s (ref 9.1–12.0)

## 2017-01-27 LAB — BASIC METABOLIC PANEL
BUN/Creatinine Ratio: 21 (ref 10–24)
BUN: 21 mg/dL (ref 8–27)
CO2: 23 mmol/L (ref 20–29)
CREATININE: 0.99 mg/dL (ref 0.76–1.27)
Calcium: 9.7 mg/dL (ref 8.6–10.2)
Chloride: 102 mmol/L (ref 96–106)
GFR, EST AFRICAN AMERICAN: 83 mL/min/{1.73_m2} (ref >59)
GFR, EST NON AFRICAN AMERICAN: 72 mL/min/{1.73_m2} (ref >59)
Glucose: 87 mg/dL (ref 65–99)
POTASSIUM: 4.2 mmol/L (ref 3.5–5.2)
SODIUM: 143 mmol/L (ref 134–144)

## 2017-01-27 LAB — APTT: aPTT: 26 s (ref 24–33)

## 2017-01-29 ENCOUNTER — Other Ambulatory Visit: Payer: Self-pay | Admitting: Cardiovascular Disease

## 2017-01-29 DIAGNOSIS — R079 Chest pain, unspecified: Secondary | ICD-10-CM

## 2017-02-04 ENCOUNTER — Telehealth: Payer: Self-pay

## 2017-02-04 NOTE — Telephone Encounter (Signed)
Patient contacted pre-catheterization at [facility] scheduled for:  02/05/2017 @ 1300 Verified arrival time and place:  NT @ 1100 Confirmed AM meds to be taken pre-cath with sip of water: Take ASA Confirmed patient has responsible person to drive home post procedure and observe patient for 24 hours:  yes Addl concerns:  none

## 2017-02-05 ENCOUNTER — Ambulatory Visit (HOSPITAL_COMMUNITY)
Admission: RE | Disposition: A | Discharge status: Home / Self Care | Payer: Self-pay | Source: Ambulatory Visit | Attending: Cardiovascular Disease

## 2017-02-05 ENCOUNTER — Encounter (HOSPITAL_COMMUNITY): Payer: Self-pay | Admitting: Cardiovascular Disease

## 2017-02-05 ENCOUNTER — Ambulatory Visit (HOSPITAL_COMMUNITY)
Admission: RE | Admit: 2017-02-05 | Discharge: 2017-02-05 | Disposition: A | Discharge status: Home / Self Care | Payer: Medicare Other | Source: Ambulatory Visit | Attending: Cardiovascular Disease | Admitting: Cardiovascular Disease

## 2017-02-05 DIAGNOSIS — E785 Hyperlipidemia, unspecified: Secondary | ICD-10-CM | POA: Diagnosis not present

## 2017-02-05 DIAGNOSIS — I350 Nonrheumatic aortic (valve) stenosis: Secondary | ICD-10-CM | POA: Diagnosis not present

## 2017-02-05 DIAGNOSIS — I25118 Atherosclerotic heart disease of native coronary artery with other forms of angina pectoris: Secondary | ICD-10-CM | POA: Diagnosis not present

## 2017-02-05 DIAGNOSIS — R079 Chest pain, unspecified: Secondary | ICD-10-CM | POA: Diagnosis present

## 2017-02-05 DIAGNOSIS — I208 Other forms of angina pectoris: Secondary | ICD-10-CM | POA: Diagnosis present

## 2017-02-05 HISTORY — PX: LEFT HEART CATH AND CORONARY ANGIOGRAPHY: CATH118249

## 2017-02-05 SURGERY — LEFT HEART CATH AND CORONARY ANGIOGRAPHY
Anesthesia: LOCAL

## 2017-02-05 MED ORDER — IOPAMIDOL (ISOVUE-370) INJECTION 76%
INTRAVENOUS | Status: AC
Start: 2017-02-05 — End: 2017-02-05
  Filled 2017-02-05: qty 50

## 2017-02-05 MED ORDER — LIDOCAINE HCL (PF) 1 % IJ SOLN
INTRAMUSCULAR | Status: DC | PRN
  Administered 2017-02-05: 2 mL via INTRADERMAL

## 2017-02-05 MED ORDER — MIDAZOLAM HCL 2 MG/2ML IJ SOLN
INTRAMUSCULAR | Status: DC | PRN
  Administered 2017-02-05: 1 mg via INTRAVENOUS

## 2017-02-05 MED ORDER — SODIUM CHLORIDE 0.9 % WEIGHT BASED INFUSION
3.0000 mL/kg/h | INTRAVENOUS | Status: AC
  Administered 2017-02-05: 3 mL/kg/h via INTRAVENOUS

## 2017-02-05 MED ORDER — HEPARIN SODIUM (PORCINE) 1000 UNIT/ML IJ SOLN
INTRAMUSCULAR | Status: AC
  Filled 2017-02-05: qty 1

## 2017-02-05 MED ORDER — VERAPAMIL HCL 2.5 MG/ML IV SOLN
INTRAVENOUS | Status: AC
Start: 2017-02-05 — End: 2017-02-05
  Filled 2017-02-05: qty 2

## 2017-02-05 MED ORDER — VERAPAMIL HCL 2.5 MG/ML IV SOLN
INTRA_ARTERIAL | Status: DC | PRN
  Administered 2017-02-05: 5 mL via INTRA_ARTERIAL

## 2017-02-05 MED ORDER — FENTANYL CITRATE (PF) 100 MCG/2ML IJ SOLN
INTRAMUSCULAR | Status: DC | PRN
  Administered 2017-02-05: 25 ug via INTRAVENOUS

## 2017-02-05 MED ORDER — SODIUM CHLORIDE 0.9% FLUSH: 3.0000 mL | INTRAVENOUS | Status: DC | PRN

## 2017-02-05 MED ORDER — ASPIRIN 81 MG PO CHEW: 81.0000 mg | CHEWABLE_TABLET | ORAL | Status: DC

## 2017-02-05 MED ORDER — IOPAMIDOL (ISOVUE-370) INJECTION 76%
INTRAVENOUS | Status: AC
Start: 2017-02-05 — End: 2017-02-05
  Filled 2017-02-05: qty 100

## 2017-02-05 MED ORDER — HEPARIN (PORCINE) IN NACL 2-0.9 UNIT/ML-% IJ SOLN
INTRAMUSCULAR | Status: AC | PRN
  Administered 2017-02-05: 1000 mL

## 2017-02-05 MED ORDER — HEPARIN (PORCINE) IN NACL 2-0.9 UNIT/ML-% IJ SOLN
INTRAMUSCULAR | Status: AC
  Filled 2017-02-05: qty 1000

## 2017-02-05 MED ORDER — LIDOCAINE HCL 2 % IJ SOLN
INTRAMUSCULAR | Status: AC
  Filled 2017-02-05: qty 10

## 2017-02-05 MED ORDER — MIDAZOLAM HCL 2 MG/2ML IJ SOLN
INTRAMUSCULAR | Status: AC
  Filled 2017-02-05: qty 2

## 2017-02-05 MED ORDER — SODIUM CHLORIDE 0.9 % WEIGHT BASED INFUSION: 1.0000 mL/kg/h | INTRAVENOUS | Status: DC

## 2017-02-05 MED ORDER — HEPARIN SODIUM (PORCINE) 1000 UNIT/ML IJ SOLN
INTRAMUSCULAR | Status: DC | PRN
  Administered 2017-02-05: 3500 [IU] via INTRAVENOUS

## 2017-02-05 MED ORDER — IOPAMIDOL (ISOVUE-370) INJECTION 76%
INTRAVENOUS | Status: DC | PRN
Start: 2017-02-05 — End: 2017-02-05
  Administered 2017-02-05: 90 mL via INTRA_ARTERIAL

## 2017-02-05 MED ORDER — DIAZEPAM 5 MG PO TABS
5.0000 mg | ORAL_TABLET | Freq: Once | ORAL | Status: AC
  Administered 2017-02-05: 5 mg via ORAL

## 2017-02-05 MED ORDER — DIAZEPAM 5 MG PO TABS
ORAL_TABLET | ORAL | Status: AC
  Filled 2017-02-05: qty 1

## 2017-02-05 MED ORDER — NITROGLYCERIN 1 MG/10 ML FOR IR/CATH LAB
INTRA_ARTERIAL | Status: AC
  Filled 2017-02-05: qty 10

## 2017-02-05 MED ORDER — FENTANYL CITRATE (PF) 100 MCG/2ML IJ SOLN
INTRAMUSCULAR | Status: AC
  Filled 2017-02-05: qty 2

## 2017-02-05 SURGICAL SUPPLY — 13 items
CATH INFINITI 5FR ANG PIGTAIL (CATHETERS) ×2 IMPLANT
CATH INFINITI 5FR JL4 (CATHETERS) ×2 IMPLANT
CATH OPTITORQUE TIG 4.0 5F (CATHETERS) ×2 IMPLANT
DEVICE RAD COMP TR BAND LRG (VASCULAR PRODUCTS) ×2 IMPLANT
GLIDESHEATH SLEND A-KIT 6F 22G (SHEATH) ×2 IMPLANT
GUIDEWIRE INQWIRE 1.5J.035X260 (WIRE) ×1 IMPLANT
INQWIRE 1.5J .035X260CM (WIRE) ×2
KIT HEART LEFT (KITS) ×2 IMPLANT
PACK CARDIAC CATHETERIZATION (CUSTOM PROCEDURE TRAY) ×2 IMPLANT
SYR MEDRAD MARK V 150ML (SYRINGE) ×2 IMPLANT
TRANSDUCER W/STOPCOCK (MISCELLANEOUS) ×2 IMPLANT
TUBING CIL FLEX 10 FLL-RA (TUBING) ×2 IMPLANT
WIRE HI TORQ VERSACORE-J 145CM (WIRE) ×2 IMPLANT

## 2017-02-05 NOTE — Interval H&P Note (Signed)
Cath Lab Visit (complete for each Cath Lab visit)  Clinical Evaluation Leading to the Procedure:   ACS: No.  Non-ACS:    Anginal Classification: CCS II  Anti-ischemic medical therapy: Minimal Therapy (1 class of medications)  Non-Invasive Test Results: High-risk stress test findings: cardiac mortality >3%/year  Prior CABG: No previous CABG      History and Physical Interval Note:  02/05/2017 10:51 AM  Derrick Compton  has presented today for surgery, with the diagnosis of cp  The various methods of treatment have been discussed with the patient and family. After consideration of risks, benefits and other options for treatment, the patient has consented to  Procedure(s): LEFT HEART CATH AND CORONARY ANGIOGRAPHY (N/A) as a surgical intervention .  The patient's history has been reviewed, patient examined, no change in status, stable for surgery.  I have reviewed the patient's chart and labs.  Questions were answered to the patient's satisfaction.     Quay Burow

## 2017-02-05 NOTE — Discharge Instructions (Signed)

## 2017-02-05 NOTE — H&P (View-Only) (Signed)
01/16/2017 Derrick Compton   November 13, 1934  [id number]  Primary Physician Patient, No Pcp Per Primary Cardiologist: Lorretta Harp MD Renae Gloss  HPI:  Derrick Compton is a 82 y.o. 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 He was referred by the [facility] clinic for cardiovascular evaluation because of proximity and progressive chest pain. I last saw him in the office 11/21/16. His only cardiovascular risk factor is hyperlipidemia. He did have a coronary calcium score performed 10/04/15 which was 181 a subsequent GXT which was normal. He exercises frequently has noticed progressive substernal chest pain with running. I perform Myoview stress testing on 11/11/16 which was entirely normal 2-D echo performed 12/09/16 showed mild aortic stenosis. He studies continued symptoms I ordered a coronary CTA/FFR on 12/24/16 showed a significant lesion in a diagonal branch.    Current Meds  Medication Sig  . ALPRAZolam (XANAX) 0.5 MG tablet Take 0.5 mg by mouth at bedtime as needed.  [name] LORazepam (ATIVAN) 0.5 MG tablet Take 0.5 mg by mouth every 8 (eight) hours.  . meclizine (ANTIVERT) 25 MG tablet Take 25 mg by mouth as needed.  . rosuvastatin (CRESTOR) 40 MG tablet Take 0.5 tablets by mouth daily.  . sertraline (ZOLOFT) 100 MG tablet Take 100 mg by mouth daily.  . valACYclovir (VALTREX) 1000 MG tablet Take 1,000 mg by mouth daily.     No Known Allergies  Social History   Social History  . Marital status: Married    Spouse name: N/A  . Number of children: N/A  . Years of education: N/A   Occupational History  . Not on file.   Social History Main Topics  . Smoking status: Never Smoker  . Smokeless tobacco: Never Used  . Alcohol use No  . Drug use: No  . Sexual activity: Not on file   Other Topics Concern  . Not on file   Social History  Narrative  . No narrative on file     Review of Systems: General: negative for chills, fever, night sweats or weight changes.  Cardiovascular: negative for chest pain, dyspnea on exertion, edema, orthopnea, palpitations, paroxysmal nocturnal dyspnea or shortness of breath Dermatological: negative for rash Respiratory: negative for cough or wheezing Urologic: negative for hematuria Abdominal: negative for nausea, vomiting, diarrhea, bright red blood per rectum, melena, or hematemesis Neurologic: negative for visual changes, syncope, or dizziness All other systems reviewed and are otherwise negative except as noted above.    Blood pressure 113/60, pulse 60, height 5\' 11"  (1.803 m), weight 161 lb (73 kg), SpO2 97 %.  General appearance: alert and no distress Neck: no adenopathy, no carotid bruit, no JVD, supple, symmetrical, trachea midline and thyroid not enlarged, symmetric, no tenderness/mass/nodules Lungs: clear to auscultation bilaterally Heart: Soft outflow tract murmur consistent with aortic stenosis Extremities: Soft outflow tract murmur consistent with aortic stenosis  EKG not performed today  ASSESSMENT AND PLAN:   Hyperlipidemia History of hyperlipidemia on statin therapy followed by his PCP  Aortic stenosis, mild History of mild aortic stenosis with recent echo performed 12/09/16 revealing a mean aortic valve gradient of 11 mmHg with a peak of 21 mm and normal ejection fraction. We will continue to follow on an annual basis.  Chest pain Mr. [name] she was to have atypical chest pain which has been exertional component. His Myoview stress test was normal over  his coronary CTA and CT FFR appeared to show a significant diagonal branch stenosis. Based on this we decided to proceed with outpatient radial diagnostic coronary angiography to further define his anatomy.The patient understands that risks included but are not limited to stroke (1 in 1000), death (1 in 59), kidney  failure [usually temporary] (1 in 500), bleeding (1 in 200), allergic reaction [possibly serious] (1 in 200). The patient understands and agrees to proceed      Lorretta Harp MD [facility], [facility] 01/16/2017 9:20 AM

## 2017-02-06 MED FILL — Lidocaine HCl Local Inj 2%: INTRAMUSCULAR | Qty: 10 | Status: AC

## 2017-02-10 ENCOUNTER — Encounter: Payer: Self-pay | Admitting: Cardiovascular Disease

## 2017-02-10 ENCOUNTER — Ambulatory Visit (INDEPENDENT_AMBULATORY_CARE_PROVIDER_SITE_OTHER): Payer: Medicare Other | Admitting: Cardiovascular Disease

## 2017-02-10 VITALS — BP 93/59 | HR 50 | Ht 70.0 in | Wt 155.6 lb

## 2017-02-10 DIAGNOSIS — E7800 Pure hypercholesterolemia, unspecified: Secondary | ICD-10-CM | POA: Diagnosis not present

## 2017-02-10 DIAGNOSIS — I350 Nonrheumatic aortic (valve) stenosis: Secondary | ICD-10-CM | POA: Diagnosis not present

## 2017-02-10 DIAGNOSIS — I208 Other forms of angina pectoris: Secondary | ICD-10-CM

## 2017-02-10 NOTE — Patient Instructions (Signed)

## 2017-02-10 NOTE — Progress Notes (Signed)
02/10/2017 Pierce Crane Baldi   Sep 04, 1934  [id number]  Primary Physician Patient, No Pcp Per Primary Cardiologist: Lorretta Harp MD Renae Gloss  HPI:  Issac Moure Scheck is a 82 y.o. 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 He was referred by the [facility] clinic for cardiovascular evaluation because of proximity and progressive chest pain. I last saw him in the office A/24/18.[name] His only cardiovascular risk factor is hyperlipidemia. He did have a coronary calcium score performed 10/04/15 which was 181 a subsequent GXT which was normal. He exercises frequently has noticed progressive substernal chest pain with running. I perform Myoview stress testing on 11/11/16 which was entirely normal 2-D echo performed 12/09/16 showed mild aortic stenosis. He studies continued symptoms I ordered a coronary CTA/FFR on 12/24/16 showed a significant lesion in a diagonal branch.  He underwent cardiac catheterization by myself 02/05/17 revealing a 90% ostial high first diagonal branch stenosis which came off orthogonal to the LAD making percutaneous intervention high risk. I do not think that the risk of intervention would be worth potential benefit of revascularization. The patient, because of his relative hypotension and bradycardia is not a good candidate for anginal medications.   Current Meds  Medication Sig  . ALPRAZolam (XANAX) 0.5 MG tablet Take 0.5 mg by mouth at bedtime as needed.  [name] LORazepam (ATIVAN) 0.5 MG tablet Take 0.5 mg by mouth every 8 (eight) hours as needed for anxiety.   . meclizine (ANTIVERT) 25 MG tablet Take 25 mg by mouth 2 (two) times daily as needed for dizziness or nausea.   . rosuvastatin (CRESTOR) 40 MG tablet Take 0.5 tablets by mouth daily.  . sertraline (ZOLOFT) 100 MG tablet Take 100 mg by mouth daily.  . valACYclovir (VALTREX) 1000  MG tablet Take 1,000 mg by mouth daily.     No Known Allergies  Social History   Social History  . Marital status: Married    Spouse name: N/A  . Number of children: N/A  . Years of education: N/A   Occupational History  . Not on file.   Social History Main Topics  . Smoking status: Never Smoker  . Smokeless tobacco: Never Used  . Alcohol use No  . Drug use: No  . Sexual activity: Not on file   Other Topics Concern  . Not on file   Social History Narrative  . No narrative on file     Review of Systems: General: negative for chills, fever, night sweats or weight changes.  Cardiovascular: negative for chest pain, dyspnea on exertion, edema, orthopnea, palpitations, paroxysmal nocturnal dyspnea or shortness of breath Dermatological: negative for rash Respiratory: negative for cough or wheezing Urologic: negative for hematuria Abdominal: negative for nausea, vomiting, diarrhea, bright red blood per rectum, melena, or hematemesis Neurologic: negative for visual changes, syncope, or dizziness All other systems reviewed and are otherwise negative except as noted above.    Blood pressure (!) 93/59, pulse (!) 50, height 5\' 10"  (1.778 m), weight 155 lb 9.6 oz (70.6 kg).  General appearance: alert and no distress Neck: no adenopathy, no carotid bruit, no JVD, supple, symmetrical, trachea midline and thyroid not enlarged, symmetric, no tenderness/mass/nodules Lungs: clear to auscultation bilaterally Heart: regular rate and rhythm, S1, S2 normal, no murmur, click, rub or gallop Extremities: extremities normal, atraumatic, no cyanosis or edema Pulses: 2+ and symmetric Skin: Skin color, texture,  turgor normal. No rashes or lesions Neurologic: Alert and oriented X 3, normal strength and tone. Normal symmetric reflexes. Normal coordination and gait  EKG not performed today  ASSESSMENT AND PLAN:   Hyperlipidemia History of hyperlipidemia on statin therapy followed by his  PCP  Chest pain History of recent CT FFR that showed diagonal branch disease which led to a outpatient radial cath by myself 02/05/17 revealing a 90% ostial first diagonal branch stenosis on a 90 bend not favorable for percutaneous intervention. Unfortunately, he is bradycardic and hypotensive because he is in such good shape making pharmacologic therapy with nitrates or beta blockers not possible. Recommend continued conservative therapy.  Aortic stenosis, mild History of mild aortic stenosis by 2-D echo 12/09/16 with a valve area of 1.5 cm and a peak gradient of 21 mmHg. We'll continue to follow this on an annual basis by 2-D echocardiography.      Lorretta Harp MD FACP,FACC,FAHA, [facility] 02/10/2017 3:43 PM

## 2017-02-10 NOTE — Assessment & Plan Note (Signed)
History of mild aortic stenosis by 2-D echo 12/09/16 with a valve area of 1.5 cm and a peak gradient of 21 mmHg. We'll continue to follow this on an annual basis by 2-D echocardiography.

## 2017-02-10 NOTE — Assessment & Plan Note (Signed)
History of hyperlipidemia on statin therapy followed by his PCP 

## 2017-02-10 NOTE — Assessment & Plan Note (Signed)
History of recent CT FFR that showed diagonal branch disease which led to a outpatient radial cath by myself 02/05/17 revealing a 90% ostial first diagonal branch stenosis on a 90 bend not favorable for percutaneous intervention. Unfortunately, he is bradycardic and hypotensive because he is in such good shape making pharmacologic therapy with nitrates or beta blockers not possible. Recommend continued conservative therapy.

## 2017-02-11 ENCOUNTER — Ambulatory Visit: Payer: Medicare Other | Admitting: Cardiovascular Disease

## 2017-06-02 ENCOUNTER — Telehealth: Payer: Self-pay | Admitting: Cardiovascular Disease

## 2017-06-02 ENCOUNTER — Encounter: Payer: Self-pay | Admitting: Cardiovascular Disease

## 2017-06-02 NOTE — Telephone Encounter (Signed)
Returned call to get more information.  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   They saw that he has been seeing Dr. Gwenlyn Found for cardiac assessment and need a letter stating he is cleared from a cardiac standpoint.    Advised I would route to Dr. Gwenlyn Found and primary nurse to address.

## 2017-06-02 NOTE — Telephone Encounter (Signed)
Spoke to pt to inform him I will have this letter signed by Dr. Gwenlyn Found tomorrow and faxed to [facility] at number provided. Pt verbalized thanks!

## 2017-06-02 NOTE — Telephone Encounter (Signed)
Letter typed and printed. Will have signed by Dr. Gwenlyn Found tomorrow.

## 2017-06-02 NOTE — Telephone Encounter (Signed)
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 Please fax to this to [facility] asap-Fax is [phone].Marland KitchenPlease let him know when you fax this.

## 2017-06-02 NOTE — Telephone Encounter (Signed)
Cleared for car racing

## 2017-08-18 ENCOUNTER — Telehealth: Payer: Self-pay | Admitting: Cardiovascular Disease

## 2017-08-18 NOTE — Telephone Encounter (Signed)
Returned call to patient no answer.LMTC. 

## 2017-08-18 NOTE — Telephone Encounter (Signed)
Returned call to patient he stated every time he works out he has chest pain.Stated pain is getting worse.No chest pain at present.Advised not to work out.Appointment scheduled with Jory Sims DNP 08/19/17 at 9:00 am.Advised to go to ED if needed.

## 2017-08-18 NOTE — Telephone Encounter (Signed)
New Message   Pt c/o of Chest Pain: STAT if CP now or developed within 24 hours  1. Are you having CP right now? No   2. Are you experiencing any other symptoms (ex. SOB, nausea, vomiting, sweating)? No symptoms  3. How long have you been experiencing CP? About a month but only when working out   4. Is your CP continuous or coming and going? Coming and going  5. Have you taken Nitroglycerin? No   Patient only experiences chest pain when he works out. But the pain is more frequent. ?

## 2017-08-19 ENCOUNTER — Ambulatory Visit (INDEPENDENT_AMBULATORY_CARE_PROVIDER_SITE_OTHER): Payer: Medicare Other | Admitting: Adult Health

## 2017-08-19 ENCOUNTER — Encounter: Payer: Self-pay | Admitting: Adult Health

## 2017-08-19 VITALS — BP 118/56 | HR 52 | Ht 70.0 in | Wt 158.8 lb

## 2017-08-19 DIAGNOSIS — E7800 Pure hypercholesterolemia, unspecified: Secondary | ICD-10-CM | POA: Diagnosis not present

## 2017-08-19 DIAGNOSIS — I208 Other forms of angina pectoris: Secondary | ICD-10-CM

## 2017-08-19 MED ORDER — AMLODIPINE BESYLATE 2.5 MG PO TABS: 2.5000 mg | ORAL_TABLET | Freq: Every day | ORAL | 3 refills | Status: DC

## 2017-08-19 NOTE — Patient Instructions (Signed)
Medication Instructions:  Start amlodipine 2.5mg  daily.  If you need a refill on your cardiac medications before your next appointment, please call your pharmacy.  Follow-Up: Your physician wants you to follow-up in: 1 month per Dr Creig Hines him in.    Thank you for choosing [facility] at [facility]!!

## 2017-08-19 NOTE — Progress Notes (Signed)
Cardiology Office Note   Date:  08/19/2017   ID:  Derrick Compton, DOB 1934/08/23, MRN [id number]  PCP:  Patient, No Pcp Per  Cardiologist:  Dr.Berry   Chief Complaint  Patient presents with  . Coronary Artery Disease     History of Present Illness: Derrick Compton is a 83 y.o. male who presents for ongoing assessment and management of chest pain, abnormal cardiac CT with calcium score of 181, subsequent GXT which was normal.  However, follow-up CTA with FFR on 12/24/2016 showed a significant lesion in a diagonal branch.  Cardiac catheterization was completed on 02/05/2017 revealing a 90% ostial high first diagonal branch stenosis came off orthogonal to the LAD making percutaneous intervention high risk.  Due to bradycardia the patient was not treated with beta-blockers or nitrates.  He was recommended for continued conservative therapy.  Other history includes mild aortic valve stenosis by echo on 12/09/2016 aortic valve area of 1.5 cm square peak gradient of 21 mmHg.  He will need to have echo's  for ongoing surveillance.  When last seen by Dr. Alvester Chou on 02/10/2017 he was doing well and was without complaints.   Patient normally works out daily on the elliptical and the treadmill for an hour a day. Over one month's time, he has noticed that when he really increases his speed or pushes himself hard, he begins to have chest pain.  The pain remains until he slows down.  Once he returns to exercise he does not have recurrent pain as long as he does not get his heart rate really elevated.  He is medically compliant, otherwise feels very well.  He wants to continue working out, but is concerned now that it will cause him to have more chest pain.  He is also not as informed concerning his diagonal blockage as he would like to be and is requesting more explanation.  Past Medical History:  Diagnosis Date  . Anxiety   . BPH (benign prostatic hyperplasia)   . Chest pain   . Hyperlipidemia   . Osteopenia      Past Surgical History:  Procedure Laterality Date  . LEFT HEART CATH AND CORONARY ANGIOGRAPHY N/A 02/05/2017   Procedure: LEFT HEART CATH AND CORONARY ANGIOGRAPHY;  Surgeon: Lorretta Harp, MD;  Location: [facility];  Service: Cardiovascular;  Laterality: N/A;     Current Outpatient Medications  Medication Sig Dispense Refill  . ALPRAZolam (XANAX) 0.5 MG tablet Take 0.5 mg by mouth at bedtime as needed.  1  . LORazepam (ATIVAN) 0.5 MG tablet Take 0.5 mg by mouth every 8 (eight) hours as needed for anxiety.     . meclizine (ANTIVERT) 25 MG tablet Take 25 mg by mouth 2 (two) times daily as needed for dizziness or nausea.   0  . rosuvastatin (CRESTOR) 40 MG tablet Take 0.5 tablets by mouth daily.  3  . sertraline (ZOLOFT) 100 MG tablet Take 100 mg by mouth daily.  1  . valACYclovir (VALTREX) 1000 MG tablet Take 1,000 mg by mouth daily.  5   No current facility-administered medications for this visit.     Allergies:   Patient has no known allergies.    Social History:  The patient  reports that he has never smoked. He has never used smokeless tobacco. He reports that he does not drink alcohol or use drugs.   Family History:  The patient's family history is not on file.    ROS: All other systems are  reviewed and negative. Unless otherwise mentioned in H&P    PHYSICAL EXAM: VS:  Ht 5\' 10"  (1.778 m)   Wt 158 lb 12.8 oz (72 kg)   SpO2 95%   BMI 22.79 kg/m  , BMI Body mass index is 22.79 kg/m. GEN: Well nourished, well developed, in no acute distress  HEENT: normal  Neck: no JVD, carotid bruits, or masses Cardiac: RRR; no murmurs, rubs, or gallops,no edema  Respiratory:  clear to auscultation bilaterally, normal work of breathing GI: soft, nontender, nondistended, + BS MS: no deformity or atrophy  Skin: warm and dry, no rash Neuro:  Strength and sensation are intact Psych: euthymic mood, full affect   EKG: Sinus bradycardia, PACs, incomplete right bundle  branch block with mild LVH.  Heart rate 54 bpm.   Recent Labs: 01/27/2017: BUN 21; Creatinine, Ser 0.99; Hemoglobin 14.7; Platelets 205; Potassium 4.2; Sodium 143    Lipid Panel    Component Value Date/Time   CHOL 196 02/04/2011 1509   TRIG 99 02/04/2011 1509   HDL 63 02/04/2011 1509   CHOLHDL 3.1 02/04/2011 1509   VLDL 20 02/04/2011 1509   LDLCALC 113 (H) 02/04/2011 1509      Wt Readings from Last 3 Encounters:  08/19/17 158 lb 12.8 oz (72 kg)  02/10/17 155 lb 9.6 oz (70.6 kg)  02/05/17 155 lb (70.3 kg)    Other studies Reviewed:  Cardiac Cath 02/05/2017 Conclusion     Ost 1st Diag lesion, 95 %stenosed.  The left ventricular systolic function is normal.  LV end diastolic pressure is normal.  The left ventricular ejection fraction is 55-65% by visual estimate.     Echo Left ventricle: The cavity size was normal. Systolic function was   normal. The estimated ejection fraction was in the range of 60%   to 65%. Wall motion was normal; there were no regional wall   motion abnormalities. Doppler parameters are consistent with   abnormal left ventricular relaxation (grade 1 diastolic   dysfunction). There was no evidence of elevated ventricular   filling pressure by Doppler parameters. - Aortic valve: Trileaflet; mildly thickened, mildly calcified   leaflets. There was mild stenosis. Mean gradient (S): 11 mm Hg.   Peak gradient (S): 21 mm Hg. - Aortic root: The aortic root was normal in size. - Ascending aorta: The ascending aorta was normal in size. - Mitral valve: Calcified annulus. Mildly thickened leaflets .   There was no significant regurgitation. - Left atrium: The atrium was mildly dilated. - Right atrium: The atrium was normal in size. - Tricuspid valve: There was mild regurgitation. - Pulmonary arteries: Systolic pressure was within the normal   range. - Inferior vena cava: The vessel was normal in size. The   respirophasic diameter changes were in the  normal range (>= 50%),   consistent with normal central venous pressure. - Pericardium, extracardiac: There was no pericardial effusion.  ASSESSMENT AND PLAN:  1.  Coronary artery disease: Most recent cardiac catheterization revealed  orthogonal to the LAD making percutaneous intervention high risk for intervention.  He is beginning to have chest pain with heavy exertion, increasing his speed and pushing himself harder on the elliptical.  Once he slows down he feels better.  I have first of all asked him not to increase his speed during his elliptical to push himself further as this is causing symptoms.  I have spoken with Dr. Gwenlyn Found onsite, who has reviewed his cardiac catheterization films.  He still does not  feel that intervention could be completed without serious risks.  It is his recommendation that he begin amlodipine 2.5 mg daily.  He will have close follow-up with Dr. Gwenlyn Found for further discussion and response to medication.  I have spent greater than 35 minutes talking about this with the patient, explained to him about his diagonal branch, and the danger of intervention.  I have also explained to him that I did speak with Dr. Gwenlyn Found about this.  Multiple questions were answered  2.  Hypercholesterolemia: Continue statin therapy with rosuvastatin 40 mg daily.  Follow-up lipids and LFTs in 3 months.  Current medicines are reviewed at length with the patient today.    Labs/ tests ordered today include: None  Phill Myron. West Pugh, ANP, AACC   08/19/2017 8:54 AM    [facility] [address], [place], [place] [address] Phone: [phone]; Fax: [phone]

## 2017-08-26 ENCOUNTER — Ambulatory Visit: Payer: Medicare Other | Admitting: Cardiovascular Disease

## 2017-09-22 ENCOUNTER — Encounter: Payer: Self-pay | Admitting: Cardiovascular Disease

## 2017-09-22 ENCOUNTER — Ambulatory Visit (INDEPENDENT_AMBULATORY_CARE_PROVIDER_SITE_OTHER): Payer: Medicare Other | Admitting: Cardiovascular Disease

## 2017-09-22 DIAGNOSIS — I350 Nonrheumatic aortic (valve) stenosis: Secondary | ICD-10-CM | POA: Diagnosis not present

## 2017-09-22 DIAGNOSIS — I2510 Atherosclerotic heart disease of native coronary artery without angina pectoris: Secondary | ICD-10-CM

## 2017-09-22 DIAGNOSIS — E7800 Pure hypercholesterolemia, unspecified: Secondary | ICD-10-CM

## 2017-09-22 NOTE — Assessment & Plan Note (Signed)
History of mild aortic stenosis with echo performed 12/09/2016 we will recheck a 2D echocardiogram

## 2017-09-22 NOTE — Assessment & Plan Note (Signed)
History of hyperlipidemia on statin therapy followed by his PCP 

## 2017-09-22 NOTE — Patient Instructions (Addendum)
Medication Instructions: Your physician recommends that you continue on your current medications as directed. Please refer to the Current Medication list given to you today.  Testing: Your physician has requested that you have an echocardiogram in July. Echocardiography is a painless test that uses sound waves to create images of your heart. It provides your doctor with information about the size and shape of your heart and how well your heart's chambers and valves are working. This procedure takes approximately one hour. There are no restrictions for this procedure.  Follow-Up: We request that you follow-up in: 6 months with Jory Sims, NP and in 12 months with Dr Andria Rhein will receive a reminder letter in the mail two months in advance. If you don't receive a letter, please call our office to schedule the follow-up appointment.  If you need a refill on your cardiac medications before your next appointment, please call your pharmacy.

## 2017-09-22 NOTE — Progress Notes (Signed)
09/22/2017 Derrick Compton   March 09, 1935  [id number]  Primary Physician Patient, No Pcp Per Primary Cardiologist: Lorretta Harp MD Renae Gloss  HPI:  Derrick Compton is a 83 y.o.  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 He was referred by the [facility] clinic for cardiovascular evaluation because of proximity and progressive chest pain. I last saw him in the office 02/10/2017.Derrick Compton His only cardiovascular risk factor is hyperlipidemia. He did have a coronary calcium score performed 10/04/15 which was 181 a subsequent GXT which was normal. He exercises frequently has noticed progressive substernal chest pain with running. I perform Myoview stress testing on 11/11/16 which was entirely normal 2-D echo performed 12/09/16 showed mild aortic stenosis. He studies continued symptoms I ordered a coronary CTA/FFR on 12/24/16 showed a significant lesion in a diagonal branch.  He underwent cardiac catheterization by myself 02/05/17 revealing a 90% ostial high first diagonal branch stenosis which came off orthogonal to the LAD making percutaneous intervention high risk. I do not think that the risk of intervention would be worth potential benefit of revascularization. The patient, because of his relative hypotension and bradycardia is not a good candidate for anginal medications.  Since I saw him back in September of last year-vigorously and does notice some mild chest discomfort and shortness of breath.     Current Meds  Medication Sig  . ALPRAZolam (XANAX) 0.5 MG tablet Take 0.5 mg by mouth at bedtime as needed.  Derrick Compton amLODipine (NORVASC) 2.5 MG tablet Take 1 tablet (2.5 mg total) by mouth daily.  Derrick Compton LORazepam (ATIVAN) 0.5 MG tablet Take 0.5 mg by mouth every 8 (eight) hours as needed for anxiety.   . meclizine (ANTIVERT) 25 MG tablet Take 25 mg by mouth 2 (two) times  daily as needed for dizziness or nausea.   . rosuvastatin (CRESTOR) 40 MG tablet Take 0.5 tablets by mouth daily.  . sertraline (ZOLOFT) 100 MG tablet Take 100 mg by mouth daily.  . valACYclovir (VALTREX) 1000 MG tablet Take 1,000 mg by mouth daily.     No Known Allergies  Social History   Socioeconomic History  . Marital status: Married    Spouse name: Not on file  . Number of children: Not on file  . Years of education: Not on file  . Highest education level: Not on file  Occupational History  . Not on file  Social Needs  . Financial resource strain: Not on file  . Food insecurity:    Worry: Not on file    Inability: Not on file  . Transportation needs:    Medical: Not on file    Non-medical: Not on file  Tobacco Use  . Smoking status: Never Smoker  . Smokeless tobacco: Never Used  Substance and Sexual Activity  . Alcohol use: No  . Drug use: No  . Sexual activity: Not on file  Lifestyle  . Physical activity:    Days per week: Not on file    Minutes per session: Not on file  . Stress: Not on file  Relationships  . Social connections:    Talks on phone: Not on file    Gets together: Not on file    Attends religious service: Not on file    Active member of club or organization: Not on file    Attends meetings of clubs or organizations: Not on file  Relationship status: Not on file  . Intimate partner violence:    Fear of current or ex partner: Not on file    Emotionally abused: Not on file    Physically abused: Not on file    Forced sexual activity: Not on file  Other Topics Concern  . Not on file  Social History Narrative  . Not on file     Review of Systems: General: negative for chills, fever, night sweats or weight changes.  Cardiovascular: negative for chest pain, dyspnea on exertion, edema, orthopnea, palpitations, paroxysmal nocturnal dyspnea or shortness of breath Dermatological: negative for rash Respiratory: negative for cough or  wheezing Urologic: negative for hematuria Abdominal: negative for nausea, vomiting, diarrhea, bright red blood per rectum, melena, or hematemesis Neurologic: negative for visual changes, syncope, or dizziness All other systems reviewed and are otherwise negative except as noted above.    Blood pressure (!) 112/54, pulse (!) 56, height 5\' 10"  (1.778 m), weight 157 lb (71.2 kg).  General appearance: alert and no distress Neck: no adenopathy, no carotid bruit, no JVD, supple, symmetrical, trachea midline and thyroid not enlarged, symmetric, no tenderness/mass/nodules Lungs: clear to auscultation bilaterally Heart: regular rate and rhythm, S1, S2 normal, no murmur, click, rub or gallop Extremities: extremities normal, atraumatic, no cyanosis or edema Pulses: 2+ and symmetric Skin: Skin color, texture, turgor normal. No rashes or lesions Neurologic: Alert and oriented X 3, normal strength and tone. Normal symmetric reflexes. Normal coordination and gait  EKG not performed today  ASSESSMENT AND PLAN:   Hyperlipidemia History of hyperlipidemia on statin therapy followed by his PCP  Aortic stenosis, mild History of mild aortic stenosis with echo performed 12/09/2016 we will recheck a 2D echocardiogram  Coronary artery disease History of CAD status post cardiac catheterization 02/05/2017 revealing a 95% ostial diagonal branch stenosis.  This came off with a 90 degree angle making percutaneous revascularization technically challenging and somewhat high risk.  He is on amlodipine.  Further treatment is limited by his slow resting heart rate due to being aerobically conditioned.  We will continue to follow him medically.      Lorretta Harp MD FACP,FACC,FAHA, [facility] 09/22/2017 10:44 AM

## 2017-09-22 NOTE — Assessment & Plan Note (Signed)
History of CAD status post cardiac catheterization 02/05/2017 revealing a 95% ostial diagonal branch stenosis.  This came off with a 90 degree angle making percutaneous revascularization technically challenging and somewhat high risk.  He is on amlodipine.  Further treatment is limited by his slow resting heart rate due to being aerobically conditioned.  We will continue to follow him medically.

## 2017-11-13 DIAGNOSIS — R001 Bradycardia, unspecified: Secondary | ICD-10-CM | POA: Insufficient documentation

## 2017-12-10 ENCOUNTER — Ambulatory Visit (HOSPITAL_COMMUNITY): Payer: Medicare Other | Attending: Cardiovascular Disease

## 2018-02-15 ENCOUNTER — Encounter: Payer: Self-pay | Admitting: Behavioral Health

## 2018-03-03 ENCOUNTER — Ambulatory Visit: Payer: Self-pay | Admitting: Psychiatry

## 2018-03-05 ENCOUNTER — Other Ambulatory Visit: Payer: Self-pay

## 2018-03-05 MED ORDER — BREXPIPRAZOLE 1 MG PO TABS: 1.0000 mg | ORAL_TABLET | Freq: Every day | ORAL | 0 refills | Status: DC

## 2018-03-25 ENCOUNTER — Ambulatory Visit (INDEPENDENT_AMBULATORY_CARE_PROVIDER_SITE_OTHER): Payer: Medicare Other | Admitting: Psychiatry

## 2018-03-25 ENCOUNTER — Encounter: Payer: Self-pay | Admitting: Psychiatry

## 2018-03-25 DIAGNOSIS — F4001 Agoraphobia with panic disorder: Secondary | ICD-10-CM | POA: Diagnosis not present

## 2018-03-25 DIAGNOSIS — F331 Major depressive disorder, recurrent, moderate: Secondary | ICD-10-CM

## 2018-03-25 DIAGNOSIS — I2510 Atherosclerotic heart disease of native coronary artery without angina pectoris: Secondary | ICD-10-CM | POA: Diagnosis not present

## 2018-03-25 DIAGNOSIS — F411 Generalized anxiety disorder: Secondary | ICD-10-CM

## 2018-03-25 MED ORDER — ALPRAZOLAM 0.5 MG PO TABS: 0.5000 mg | ORAL_TABLET | Freq: Every evening | ORAL | 1 refills | Status: DC | PRN

## 2018-03-25 MED ORDER — MIRTAZAPINE 30 MG PO TABS: 30.0000 mg | ORAL_TABLET | Freq: Every day | ORAL | 2 refills | Status: DC

## 2018-03-25 NOTE — Progress Notes (Signed)
[facility]  Patient ID: Derrick Compton,  MRN: [id number]  PCP: Patient, No Pcp Per  Date of Evaluation: 03/25/2018 Time spent:25 minutes  Chief Complaint:  Chief Complaint    Depression; Anxiety      HISTORY/CURRENT STATUS: HPI  Rexulti seemed to quit working and was $300/month. Initially Rexulti helped but as he continued it it made his mood worse.  So he stopped it.   Mood seems to go from good to low periods over a week or so.  Loses energy and has to push himself again to do anything for 2 weeks then he's good again.  Dep periods started about 3 mos ago and has anhedonia..  Feels worse in the morning and after going he's better.  Denies hypmania.  Others see no difference except he's quieter at times.  Nothing close to a panic attack.  Has awoken 4-5 times hollering and swinging and even hit wife.  Never had it before.  Awakens thinking about work issues.  Usually to bed 1020 and up 6:30.  Sleeps better with Xanax but  Only takes 1-2 /week. Occ NM.  Not kicking in sleep.   Individual Medical History/ Review of Systems: Changes? :No   Allergies: Patient has no known allergies.  Current Medications:  Current Outpatient Medications:  .  ALPRAZolam (XANAX) 0.5 MG tablet, Take 0.5 mg by mouth at bedtime as needed., Disp: , Rfl: 1 .  rosuvastatin (CRESTOR) 40 MG tablet, Take 0.5 tablets by mouth daily., Disp: , Rfl: 3 .  sertraline (ZOLOFT) 100 MG tablet, Take 100 mg by mouth daily., Disp: , Rfl: 1 .  amLODipine (NORVASC) 2.5 MG tablet, Take 1 tablet (2.5 mg total) by mouth daily., Disp: 180 tablet, Rfl: 3 .  Brexpiprazole (REXULTI) 1 MG TABS, Take 1 tablet (1 mg total) by mouth daily. (Patient not taking: Reported on 03/25/2018), Disp: 30 tablet, Rfl: 0 .  meclizine (ANTIVERT) 25 MG tablet, Take 25 mg by mouth 2 (two) times daily as needed for dizziness or nausea. , Disp: , Rfl: 0 .  valACYclovir (VALTREX) 1000 MG tablet, Take 1,000 mg by mouth daily., Disp: , Rfl:  5 Medication Side Effects: none  Family Medical/ Social History: Changes? Yes Working in [place]:  There were no vitals taken for this visit.There is no height or weight on file to calculate BMI.  General Appearance: Neat  Eye Contact:  Good  Speech:  Normal Rate  Volume:  Normal  Mood:  Depressed  Affect:  Appropriate  Thought Process:  Goal Directed  Orientation:  Full (Time, Place, and Person)  Thought Content: Logical   Suicidal Thoughts:  No  Homicidal Thoughts:  No  Memory:  WNL  Judgement:  Good  Insight:  Good  Psychomotor Activity:  Normal  Concentration:  Attention Span: Good  Recall:  Good  Fund of Knowledge: Good  Language: Good  Assets:  Communication Skills Desire for Improvement Financial Resources/Insurance [place] Talents/Skills Transportation Vocational/Educational  ADL's:  Intact  Cognition: WNL  Prognosis:  Fair    DIAGNOSES:    ICD-10-CM   1. Major depressive disorder, recurrent episode, moderate (HCC) F33.1   2. Panic disorder with agoraphobia F40.01   3. Generalized anxiety disorder F41.1    RO Rem sleep behavior disorder  Receiving Psychotherapy: No    RECOMMENDATIONS:  Greater than 50% of face to face time with patient was spent on counseling and coordination of care. We discussed his  recurrent depression, cycling depression,and options including lamotrigine DT cylcing nature and mirtazapine, lthium.  Disc each in detail.  Discussed possibility this could be a variant of bipolar disorder though he denies significant hypomania.  However its unusual to have major depression cycle like this.  Lowest risk option is mirtazapine.  If this does not work then we are going to proceed with lamotrigine at follow-up.  This is a harder medication to dose so we will defer that.  We have discussed there is some risk of mania if he has a bipolar predisposition and if he starts seeing symptoms like  that please let us know.  Follow-up 2 months  FU 8 weeks   Purnell Shoemaker, MD

## 2018-04-05 ENCOUNTER — Telehealth: Payer: Self-pay | Admitting: Psychiatry

## 2018-04-05 ENCOUNTER — Other Ambulatory Visit: Payer: Self-pay

## 2018-04-05 MED ORDER — LITHIUM CARBONATE 150 MG PO CAPS: 150.0000 mg | ORAL_CAPSULE | Freq: Every day | ORAL | 1 refills | Status: DC

## 2018-04-05 NOTE — Telephone Encounter (Signed)
Patient called the office complaining that the new medication was not working and was having side effects of insomnia weight gaining and feeling hyper.  I assume that he is referring to the mirtazapine 30 mg tablet.  Make sure that he has stopped the Rexulti as that can cause some of those symptoms and he reported it had stopped working as of his last visit with me and that he had stopped taking it.  If he is complaining of side effects from the mirtazapine which can certainly cause weight gain and if it is not helping his sleep stop it.  Start lithium 150 mg capsules 1 each night #30 one refill.  We discussed this at his last visit.  If the next easiest option.  It will not make him restless nor cause insomnia.

## 2018-04-05 NOTE — Telephone Encounter (Signed)
This is the medication he was talking about and did help him sleep and feel good but too much weight gain in a week. He has stopped the rexulti. Would like to try the lithium as discussed in the office.

## 2018-04-17 ENCOUNTER — Other Ambulatory Visit: Payer: Self-pay | Admitting: Psychiatry

## 2018-04-19 NOTE — Telephone Encounter (Signed)
Clarify pt still taking

## 2018-04-27 ENCOUNTER — Other Ambulatory Visit: Payer: Self-pay | Admitting: Psychiatry

## 2018-04-27 ENCOUNTER — Ambulatory Visit (INDEPENDENT_AMBULATORY_CARE_PROVIDER_SITE_OTHER): Payer: Medicare Other | Admitting: Psychiatry

## 2018-04-27 ENCOUNTER — Encounter: Payer: Self-pay | Admitting: Psychiatry

## 2018-04-27 DIAGNOSIS — F331 Major depressive disorder, recurrent, moderate: Secondary | ICD-10-CM

## 2018-04-27 DIAGNOSIS — F411 Generalized anxiety disorder: Secondary | ICD-10-CM | POA: Diagnosis not present

## 2018-04-27 DIAGNOSIS — F4001 Agoraphobia with panic disorder: Secondary | ICD-10-CM

## 2018-04-27 DIAGNOSIS — I2510 Atherosclerotic heart disease of native coronary artery without angina pectoris: Secondary | ICD-10-CM

## 2018-04-27 NOTE — Progress Notes (Signed)
Geraldo Haris Bristow [id number] 09/23/1934 83 y.o.  Subjective:   Patient ID:  Derrick Compton is a 83 y.o. (DOB December 14, 1934) male.  Chief Complaint:  Chief Complaint  Patient presents with  . Depression  . Medication Problem    added mirtazapine, then lithium    HPI Derrick Compton presents to the office today for follow-up of TRD .  It worked but after 2 weeks he called and CO 8# weight gain.  Stopped it and lost the weight but the depression came back. A lot of depression and worry and poor sleep at night.  Now has no appetitie and is losing weight.  Review of Systems:  Review of Systems  Constitutional: Positive for activity change and unexpected weight change.  Neurological: Negative for tremors and weakness.  Psychiatric/Behavioral: Positive for dysphoric mood and sleep disturbance. Negative for agitation, behavioral problems, confusion, decreased concentration, hallucinations, self-injury and suicidal ideas. The patient is nervous/anxious. The patient is not hyperactive.     Medications: I have reviewed the patient's current medications.  Current Outpatient Medications  Medication Sig Dispense Refill  . ALPRAZolam (XANAX) 0.5 MG tablet Take 1 tablet (0.5 mg total) by mouth at bedtime as needed. 90 tablet 1  . lithium carbonate 150 MG capsule Take 1 capsule (150 mg total) by mouth at bedtime. 30 capsule 1  . meclizine (ANTIVERT) 25 MG tablet Take 25 mg by mouth 2 (two) times daily as needed for dizziness or nausea.   0  . rosuvastatin (CRESTOR) 40 MG tablet Take 0.5 tablets by mouth daily.  3  . sertraline (ZOLOFT) 100 MG tablet Take 100 mg by mouth daily.  1  . valACYclovir (VALTREX) 1000 MG tablet Take 1,000 mg by mouth daily.  5  . amLODipine (NORVASC) 2.5 MG tablet Take 1 tablet (2.5 mg total) by mouth daily. 180 tablet 3  . mirtazapine (REMERON) 30 MG tablet Take 1 tablet (30 mg total) by mouth at bedtime. (Patient not taking: Reported on 04/27/2018) 30 tablet 2   No  current facility-administered medications for this visit.     Medication Side Effects: None  Allergies: No Known Allergies  Past Medical History:  Diagnosis Date  . Anxiety   . BPH (benign prostatic hyperplasia)   . Chest pain   . Hyperlipidemia   . Osteopenia     No family history on file.  Social History   Socioeconomic History  . Marital status: Married    Spouse name: Not on file  . Number of children: Not on file  . Years of education: Not on file  . Highest education level: Not on file  Occupational History  . Not on file  Social Needs  . Financial resource strain: Not on file  . Food insecurity:    Worry: Not on file    Inability: Not on file  . Transportation needs:    Medical: Not on file    Non-medical: Not on file  Tobacco Use  . Smoking status: Never Smoker  . Smokeless tobacco: Never Used  Substance and Sexual Activity  . Alcohol use: No  . Drug use: No  . Sexual activity: Not on file  Lifestyle  . Physical activity:    Days per week: Not on file    Minutes per session: Not on file  . Stress: Not on file  Relationships  . Social connections:    Talks on phone: Not on file    Gets together: Not on file    Attends  religious service: Not on file    Active member of club or organization: Not on file    Attends meetings of clubs or organizations: Not on file    Relationship status: Not on file  . Intimate partner violence:    Fear of current or ex partner: Not on file    Emotionally abused: Not on file    Physically abused: Not on file    Forced sexual activity: Not on file  Other Topics Concern  . Not on file  Social History Narrative  . Not on file    Past Medical History, Surgical history, Social history, and Family history were reviewed and updated as appropriate.   Please see review of systems for further details on the patient's review from today.   Objective:   Physical Exam:  There were no vitals taken for this  visit.  Physical Exam  Constitutional: He is oriented to person, place, and time. He appears well-developed. No distress.  Musculoskeletal: He exhibits no deformity.  Neurological: He is alert and oriented to person, place, and time. He displays no tremor. Coordination and gait normal.  Psychiatric: His speech is normal and behavior is normal. Judgment and thought content normal. His mood appears anxious. His affect is not angry, not blunt, not labile and not inappropriate. Cognition and memory are normal. He exhibits a depressed mood. He expresses no homicidal and no suicidal ideation. He expresses no suicidal plans and no homicidal plans.  Insight and judgment fair. No auditory or visual hallucinations. No delusions.  But ruminating. He is attentive.    Lab Review:     Component Value Date/Time   NA 143 01/27/2017 0843   K 4.2 01/27/2017 0843   CL 102 01/27/2017 0843   CO2 23 01/27/2017 0843   GLUCOSE 87 01/27/2017 0843   GLUCOSE 108 (H) 02/11/2011 0533   BUN 21 01/27/2017 0843   CREATININE 0.99 01/27/2017 0843   CALCIUM 9.7 01/27/2017 0843   PROT 6.9 02/04/2011 0840   ALBUMIN 3.8 02/04/2011 0840   AST 18 02/04/2011 0840   ALT 18 02/04/2011 0840   ALKPHOS 75 02/04/2011 0840   BILITOT 0.6 02/04/2011 0840   GFRNONAA 72 01/27/2017 0843   GFRAA 83 01/27/2017 0843       Component Value Date/Time   WBC 5.7 01/27/2017 0843   WBC 5.6 02/09/2011 0510   RBC 4.69 01/27/2017 0843   RBC 4.34 02/09/2011 0510   HGB 14.7 01/27/2017 0843   HCT 43.4 01/27/2017 0843   PLT 205 01/27/2017 0843   MCV 93 01/27/2017 0843   MCH 31.3 01/27/2017 0843   MCH 31.3 02/09/2011 0510   MCHC 33.9 01/27/2017 0843   MCHC 35.1 02/09/2011 0510   RDW 13.3 01/27/2017 0843   LYMPHSABS 1.6 01/27/2017 0843   MONOABS 0.8 02/05/2011 0340   EOSABS 0.2 01/27/2017 0843   BASOSABS 0.0 01/27/2017 0843    No results found for: POCLITH, LITHIUM   No results found for: PHENYTOIN, PHENOBARB, VALPROATE, CBMZ    .res Assessment: Plan:    Major depressive disorder, recurrent episode, moderate (HCC)  Generalized anxiety disorder  Panic disorder with agoraphobia   Balancing benefit and 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  Especially bc he's med sensitive. The Mirtazapine worked but he CO weight gain.  Options increase the lithium, option lamotrigine but it is much slower. Option retry the mirtazapine..  He agrees to go back to the mirtazapine and try  To watch  eating.  Later my try the other.  This appt was 30 mins.  FU 2 mos  Lynder Parents, MD, DFAPA   Please see After Visit Summary for patient specific instructions.  No future appointments.  No orders of the defined types were placed in this encounter.     -------------------------------

## 2018-05-24 ENCOUNTER — Other Ambulatory Visit: Payer: Self-pay | Admitting: Psychiatry

## 2018-05-24 ENCOUNTER — Telehealth: Payer: Self-pay | Admitting: Psychiatry

## 2018-05-24 MED ORDER — SERTRALINE HCL 100 MG PO TABS: 100.0000 mg | ORAL_TABLET | Freq: Every day | ORAL | 0 refills | Status: DC

## 2018-05-24 NOTE — Telephone Encounter (Signed)
Call made to patient to get a follow up appt. Schedules per CC.  Needs 30 min.  LM on VM.

## 2018-05-24 NOTE — Progress Notes (Signed)
Ordered sertraline 100 mg tablets #90 no additional refills 1 daily.  Patient was last seen in December complaining of depression.  He needs a follow-up appointment  Lynder Parents, MD, DFAPA

## 2018-05-27 ENCOUNTER — Other Ambulatory Visit: Payer: Self-pay | Admitting: Psychiatry

## 2018-05-28 ENCOUNTER — Telehealth: Payer: Self-pay | Admitting: Psychiatry

## 2018-05-28 NOTE — Telephone Encounter (Signed)
PATIENT NEED REFILL ON MIRTAZAPINE TO BE SENT TO [facility] ON [address]

## 2018-05-31 ENCOUNTER — Other Ambulatory Visit: Payer: Self-pay

## 2018-05-31 MED ORDER — MIRTAZAPINE 30 MG PO TABS: 30.0000 mg | ORAL_TABLET | Freq: Every day | ORAL | 1 refills | Status: DC

## 2018-05-31 NOTE — Telephone Encounter (Signed)
rx sent to pharmacy

## 2018-06-13 DIAGNOSIS — F330 Major depressive disorder, recurrent, mild: Secondary | ICD-10-CM | POA: Insufficient documentation

## 2018-06-13 DIAGNOSIS — M1990 Unspecified osteoarthritis, unspecified site: Secondary | ICD-10-CM | POA: Insufficient documentation

## 2018-07-28 ENCOUNTER — Ambulatory Visit (INDEPENDENT_AMBULATORY_CARE_PROVIDER_SITE_OTHER): Payer: Medicare Other | Admitting: Psychiatry

## 2018-07-28 ENCOUNTER — Encounter: Payer: Self-pay | Admitting: Psychiatry

## 2018-07-28 DIAGNOSIS — F4001 Agoraphobia with panic disorder: Secondary | ICD-10-CM | POA: Diagnosis not present

## 2018-07-28 DIAGNOSIS — F331 Major depressive disorder, recurrent, moderate: Secondary | ICD-10-CM

## 2018-07-28 DIAGNOSIS — F411 Generalized anxiety disorder: Secondary | ICD-10-CM | POA: Diagnosis not present

## 2018-07-28 MED ORDER — LITHIUM CARBONATE 150 MG PO CAPS: 150.0000 mg | ORAL_CAPSULE | Freq: Every day | ORAL | 1 refills | Status: DC

## 2018-07-28 MED ORDER — SERTRALINE HCL 100 MG PO TABS: 100.0000 mg | ORAL_TABLET | Freq: Every day | ORAL | 1 refills | Status: DC

## 2018-07-28 MED ORDER — ALPRAZOLAM 0.5 MG PO TABS: 0.5000 mg | ORAL_TABLET | Freq: Every evening | ORAL | 1 refills | Status: DC | PRN

## 2018-07-28 MED ORDER — MIRTAZAPINE 30 MG PO TABS: 30.0000 mg | ORAL_TABLET | Freq: Every day | ORAL | 1 refills | Status: DC

## 2018-07-28 NOTE — Progress Notes (Signed)
Derrick Compton [id number] [birth year] 84 y.o.  Subjective:   Patient ID:  Derrick Compton is a 84 y.o. (DOB 1935-01-25) male.  Chief Complaint:  Chief Complaint  Patient presents with  . Follow-up    Medication Management    HPI Derrick Compton presents to the office today for follow-up of TRD .   At last visit in December he was depressed.  Started mirtazapine and very low dose lithium.  Awakens with a little depression.  Sleep is pretty good and rarely needs bz.  Depression better after a week or so.  Tolerating meds ok.  Not gaining weight like when on mirtazapine before.  Patient reports stable mood and denies depressed or irritable moods.  Patient denies any recent difficulty with anxiety.  Patient denies difficulty with sleep initiation or maintenance. Denies appetite disturbance.  Patient reports that energy and motivation have been good.  Patient denies any difficulty with concentration.  Patient denies any suicidal ideation.  Past psychiatric medications Wellbutrin 150, citalopram, paroxetine, Deplin, lorazepam, fluoxetine side effects, buspirone, Abilify 2 mg, Rexulti 2 mg, sertraline 100  Review of Systems:  Review of Systems  Constitutional: Negative for activity change and unexpected weight change.  Neurological: Negative for tremors and weakness.  Psychiatric/Behavioral: Negative for agitation, behavioral problems, confusion, decreased concentration, dysphoric mood, hallucinations, self-injury, sleep disturbance and suicidal ideas. The patient is not nervous/anxious and is not hyperactive.     Medications: I have reviewed the patient's current medications.  Current Outpatient Medications  Medication Sig Dispense Refill  . ALPRAZolam (XANAX) 0.5 MG tablet Take 1 tablet (0.5 mg total) by mouth at bedtime as needed. 90 tablet 1  . Cholecalciferol (VITAMIN D3) 25 MCG (1000 UT) CAPS Vitamin D3 25 mcg (1,000 unit) capsule  Take 1 capsule every day by oral route.    .  lithium carbonate 150 MG capsule TAKE 1 CAPSULE (150 MG TOTAL) BY MOUTH AT BEDTIME. 90 capsule 1  . mirtazapine (REMERON) 30 MG tablet Take 1 tablet (30 mg total) by mouth at bedtime. 30 tablet 1  . rosuvastatin (CRESTOR) 40 MG tablet Take 0.5 tablets by mouth daily.  3  . sertraline (ZOLOFT) 100 MG tablet Take 100 mg by mouth daily.  1  . sertraline (ZOLOFT) 100 MG tablet TAKE 1 TABLET BY MOUTH EVERY DAY 90 tablet 0  . valACYclovir (VALTREX) 1000 MG tablet Take 1,000 mg by mouth daily.  5  . amLODipine (NORVASC) 2.5 MG tablet Take 1 tablet (2.5 mg total) by mouth daily. (Patient not taking: Reported on 07/28/2018) 180 tablet 3  . meclizine (ANTIVERT) 25 MG tablet Take 25 mg by mouth 2 (two) times daily as needed for dizziness or nausea.   0   No current facility-administered medications for this visit.     Medication Side Effects: None  Allergies: No Known Allergies  Past Medical History:  Diagnosis Date  . Anxiety   . BPH (benign prostatic hyperplasia)   . Chest pain   . Hyperlipidemia   . Osteopenia     History reviewed. No pertinent family history.  Social History   Socioeconomic History  . Marital status: Married    Spouse name: Not on file  . Number of children: Not on file  . Years of education: Not on file  . Highest education level: Not on file  Occupational History  . Not on file  Social Needs  . Financial resource strain: Not on file  . Food insecurity:    Worry: Not  on file    Inability: Not on file  . Transportation needs:    Medical: Not on file    Non-medical: Not on file  Tobacco Use  . Smoking status: Never Smoker  . Smokeless tobacco: Never Used  Substance and Sexual Activity  . Alcohol use: No  . Drug use: No  . Sexual activity: Not on file  Lifestyle  . Physical activity:    Days per week: Not on file    Minutes per session: Not on file  . Stress: Not on file  Relationships  . Social connections:    Talks on phone: Not on file    Gets  together: Not on file    Attends religious service: Not on file    Active member of club or organization: Not on file    Attends meetings of clubs or organizations: Not on file    Relationship status: Not on file  . Intimate partner violence:    Fear of current or ex partner: Not on file    Emotionally abused: Not on file    Physically abused: Not on file    Forced sexual activity: Not on file  Other Topics Concern  . Not on file  Social History Narrative  . Not on file    Past Medical History, Surgical history, Social history, and Family history were reviewed and updated as appropriate.   Please see review of systems for further details on the patient's review from today.   Objective:   Physical Exam:  There were no vitals taken for this visit.  Physical Exam Constitutional:      General: He is not in acute distress.    Appearance: He is well-developed.  Musculoskeletal:        General: No deformity.  Neurological:     Mental Status: He is alert and oriented to person, place, and time.     Motor: No tremor.     Coordination: Coordination normal.     Gait: Gait normal.  Psychiatric:        Attention and Perception: He is attentive. He does not perceive auditory hallucinations.        Mood and Affect: Mood is not anxious or depressed. Affect is not labile, blunt, angry or inappropriate.        Speech: Speech normal.        Behavior: Behavior normal.        Thought Content: Thought content normal. Thought content does not include homicidal or suicidal ideation. Thought content does not include homicidal or suicidal plan.        Cognition and Memory: Cognition normal.        Judgment: Judgment normal.     Comments: Insight and judgment fair. No auditory or visual hallucinations. No delusions.  Ruminating stopped.     Lab Review:     Component Value Date/Time   NA 143 01/27/2017 0843   K 4.2 01/27/2017 0843   CL 102 01/27/2017 0843   CO2 23 01/27/2017 0843    GLUCOSE 87 01/27/2017 0843   GLUCOSE 108 (H) 02/11/2011 0533   BUN 21 01/27/2017 0843   CREATININE 0.99 01/27/2017 0843   CALCIUM 9.7 01/27/2017 0843   PROT 6.9 02/04/2011 0840   ALBUMIN 3.8 02/04/2011 0840   AST 18 02/04/2011 0840   ALT 18 02/04/2011 0840   ALKPHOS 75 02/04/2011 0840   BILITOT 0.6 02/04/2011 0840   GFRNONAA 72 01/27/2017 0843   GFRAA 83 01/27/2017 0843  Component Value Date/Time   WBC 5.7 01/27/2017 0843   WBC 5.6 02/09/2011 0510   RBC 4.69 01/27/2017 0843   RBC 4.34 02/09/2011 0510   HGB 14.7 01/27/2017 0843   HCT 43.4 01/27/2017 0843   PLT 205 01/27/2017 0843   MCV 93 01/27/2017 0843   MCH 31.3 01/27/2017 0843   MCH 31.3 02/09/2011 0510   MCHC 33.9 01/27/2017 0843   MCHC 35.1 02/09/2011 0510   RDW 13.3 01/27/2017 0843   LYMPHSABS 1.6 01/27/2017 0843   MONOABS 0.8 02/05/2011 0340   EOSABS 0.2 01/27/2017 0843   BASOSABS 0.0 01/27/2017 0843    No results found for: POCLITH, LITHIUM   No results found for: PHENYTOIN, PHENOBARB, VALPROATE, CBMZ   .res Assessment: Plan:    Major depressive disorder, recurrent episode, moderate (HCC)  Generalized anxiety disorder  Panic disorder with agoraphobia   Benefit from meds.  Hx multiple recurrences. Do not change AMA.  Many of the recurrences have been due to noncompliance for various reasons.  He is medication sensitive which complicates things.  He agrees to continue the current medication without making changes on his own.  Discussed the risk of relapse if he does.  FU 4 mos  Lynder Parents, MD, DFAPA   Please see After Visit Summary for patient specific instructions.  Future Appointments  Date Time Provider [place]  09/22/2018  9:00 AM Lorretta Harp, MD [facility]    No orders of the defined types were placed in this encounter.     -------------------------------

## 2018-08-17 DIAGNOSIS — J189 Pneumonia, unspecified organism: Secondary | ICD-10-CM | POA: Insufficient documentation

## 2018-09-21 ENCOUNTER — Telehealth: Payer: Self-pay | Admitting: *Deleted

## 2018-09-21 ENCOUNTER — Encounter: Payer: Self-pay | Admitting: *Deleted

## 2018-09-21 ENCOUNTER — Telehealth: Payer: Self-pay | Admitting: Cardiovascular Disease

## 2018-09-21 NOTE — Telephone Encounter (Signed)
Virtual Visit Pre-Appointment Phone Call  "(Name), I am calling you today to discuss your upcoming appointment. We are currently trying to limit exposure to the virus that causes COVID-19 by seeing patients at home rather than in the office."  1. "What is the BEST phone number to call the day of the visit?" - include this in appointment notes  2. Do you have or have access to (through a family member/friend) a smartphone with video capability that we can use for your visit?" a. If yes - list this number in appt notes as cell (if different from BEST phone #) and list the appointment type as a VIDEO visit in appointment notes b. If no - list the appointment type as a PHONE visit in appointment notes  3. Confirm consent - "In the setting of the current Covid19 crisis, you are scheduled for a (phone or video) visit with your provider on (date) at (time).  Just as we do with many in-office visits, in order for you to participate in this visit, we must obtain consent.  If you'd like, I can send this to your mychart (if signed up) or email for you to review.  Otherwise, I can obtain your verbal consent now.  All virtual visits are billed to your insurance company just like a normal visit would be.  By agreeing to a virtual visit, we'd like you to understand that the technology does not allow for your provider to perform an examination, and thus may limit your provider's ability to fully assess your condition. If your provider identifies any concerns that need to be evaluated in person, we will make arrangements to do so.  Finally, though the technology is pretty good, we cannot assure that it will always work on either your or our end, and in the setting of a video visit, we may have to convert it to a phone-only visit.  In either situation, we cannot ensure that we have a secure connection.  Are you willing to proceed?" STAFF: Did the patient verbally acknowledge consent to telehealth visit? Document  YES/NO here: YES  4. Advise patient to be prepared - "Two hours prior to your appointment, go ahead and check your blood pressure, pulse, oxygen saturation, and your weight (if you have the equipment to check those) and write them all down. When your visit starts, your provider will ask you for this information. If you have an Apple Watch or Kardia device, please plan to have heart rate information ready on the day of your appointment. Please have a pen and paper handy nearby the day of the visit as well."  5. Give patient instructions for MyChart download to smartphone OR Doximity/Doxy.me as below if video visit (depending on what platform provider is using)  6. Inform patient they will receive a phone call 15 minutes prior to their appointment time (may be from unknown caller ID) so they should be prepared to answer    TELEPHONE CALL NOTE  Alexsis Branscom [facility] has been deemed a candidate for a follow-up tele-health visit to limit community exposure during the Covid-19 pandemic. I spoke with the patient via phone to ensure availability of phone/video source, confirm preferred email & phone number, and discuss instructions and expectations.  I reminded Lott Seelbach Ghosh to be prepared with any vital sign and/or heart rhythm information that could potentially be obtained via home monitoring, at the time of his visit. I reminded Dominyck Reser Albo to expect a phone call prior to  his visit.  Venetia Maxon, CMA 09/21/2018 9:30 AM   INSTRUCTIONS FOR DOWNLOADING THE MYCHART APP TO SMARTPHONE  - The patient must first make sure to have activated MyChart and know their login information - If Apple, go to [employer] and type in MyChart in the search bar and download the app. If Android, ask patient to go to [employer] and type in [place] in the search bar and download the app. The app is free but as with any other app downloads, their phone may require them to verify saved payment information or  Apple/Android password.  - The patient will need to then log into the app with their MyChart username and password, and select [facility] as their healthcare provider to link the account. When it is time for your visit, go to the MyChart app, find appointments, and click Begin Video Visit. Be sure to Select Allow for your device to access the Microphone and Camera for your visit. You will then be connected, and your provider will be with you shortly.  **If they have any issues connecting, or need assistance please contact MyChart service desk [phone])**  **If using a computer, in order to ensure the best quality for their visit they will need to use either of the following Internet Browsers: [employer], or Google Chrome**  IF USING DOXIMITY or DOXY.ME - The patient will receive a link just prior to their visit by text.     FULL LENGTH CONSENT FOR TELE-HEALTH VISIT   I hereby voluntarily request, consent and authorize [facility] and its employed or contracted physicians, physician assistants, nurse practitioners or other licensed health care professionals (the Practitioner), to provide me with telemedicine health care services (the Services") as deemed necessary by the treating Practitioner. I acknowledge and consent to receive the Services by the Practitioner via telemedicine. I understand that the telemedicine visit will involve communicating with the Practitioner through live audiovisual communication technology and the disclosure of certain medical information by electronic transmission. I acknowledge that I have been given the opportunity to request an in-person assessment or other available alternative prior to the telemedicine visit and am voluntarily participating in the telemedicine visit.  I understand that I have the right to withhold or withdraw my consent to the use of telemedicine in the course of my care at any time, without affecting my right to future care  or treatment, and that the Practitioner or I may terminate the telemedicine visit at any time. I understand that I have the right to inspect all information obtained and/or recorded in the course of the telemedicine visit and may receive copies of available information for a reasonable fee.  I understand that some of the potential risks of receiving the Services via telemedicine include:   Delay or interruption in medical evaluation due to technological equipment failure or disruption;  Information transmitted may not be sufficient (e.g. poor resolution of images) to allow for appropriate medical decision making by the Practitioner; and/or   In rare instances, security protocols could fail, causing a breach of personal health information.  Furthermore, I acknowledge that it is my responsibility to provide information about my medical history, conditions and care that is complete and accurate to the best of my ability. I acknowledge that Practitioner's advice, recommendations, and/or decision may be based on factors not within their control, such as incomplete or inaccurate data provided by me or distortions of diagnostic images or specimens that may result from electronic transmissions.  I understand that the practice of medicine is not an exact science and that Practitioner makes no warranties or guarantees regarding treatment outcomes. I acknowledge that I will receive a copy of this consent concurrently upon execution via email to the email address I last provided but may also request a printed copy by calling the office of [facility].    I understand that my insurance will be billed for this visit.   I have read or had this consent read to me.  I understand the contents of this consent, which adequately explains the benefits and risks of the Services being provided via telemedicine.   I have been provided ample opportunity to ask questions regarding this consent and the Services and have had  my questions answered to my satisfaction.  I give my informed consent for the services to be provided through the use of telemedicine in my medical care  By participating in this telemedicine visit I agree to the above.      Cardiac Questionnaire:    Since your last visit or hospitalization:    1. Have you been having new or worsening chest pain? NO   2. Have you been having new or worsening shortness of breath? NO 3. Have you been having new or worsening leg swelling, wt gain, or increase in abdominal girth (pants fitting more tightly)?    4. Have you had any passing out spells? NO    *A YES to any of these questions would result in the appointment being kept. *If all the answers to these questions are NO, we should indicate that given the current situation regarding the worldwide coronarvirus pandemic, at the recommendation of the CDC, we are looking to limit gatherings in our waiting area, and thus will reschedule their appointment beyond four weeks from today.   _____________   COVID-19 Pre-Screening Questions:   Do you currently have a fever? NO  Have you recently travelled on a cruise, internationally, or to [place], [place], [place], [place], [place], or [place], [place] [employer]) ? NO  Have you been in contact with someone that is currently pending confirmation of Covid19 testing or has been confirmed to have the [place] virus?  NO  Are you currently experiencing fatigue or cough? NO

## 2018-09-21 NOTE — Telephone Encounter (Signed)
Smartphone/consent/declined my chart/ pre reg completed

## 2018-09-22 ENCOUNTER — Telehealth: Payer: Self-pay

## 2018-09-22 ENCOUNTER — Telehealth (INDEPENDENT_AMBULATORY_CARE_PROVIDER_SITE_OTHER): Payer: Medicare Other | Admitting: Cardiovascular Disease

## 2018-09-22 DIAGNOSIS — E782 Mixed hyperlipidemia: Secondary | ICD-10-CM

## 2018-09-22 DIAGNOSIS — I2510 Atherosclerotic heart disease of native coronary artery without angina pectoris: Secondary | ICD-10-CM

## 2018-09-22 DIAGNOSIS — I350 Nonrheumatic aortic (valve) stenosis: Secondary | ICD-10-CM

## 2018-09-22 NOTE — Telephone Encounter (Signed)
Patient and/or DPR-approved person aware of AVS instructions and verbalized understanding. Letter including After Visit Summary and any other necessary documents to be mailed to the patient's address on file.  

## 2018-09-22 NOTE — Progress Notes (Signed)
Virtual Visit via Video Note   This visit type was conducted due to national recommendations for restrictions regarding the COVID-19 Pandemic (e.g. social distancing) in an effort to limit this patient's exposure and mitigate transmission in our community.  Due to his co-morbid illnesses, this patient is at least at moderate risk for complications without adequate follow up.  This format is felt to be most appropriate for this patient at this time.  All issues noted in this document were discussed and addressed.  A limited physical exam was performed with this format.  Please refer to the patient's chart for his consent to telehealth for [facility].   Evaluation Performed:  Follow-up visit  Date:  09/22/2018   ID:  Derrick, Compton 07/01/1934, MRN [id number]  Patient Location: Home Provider Location: Home  PCP:  Patient, No Pcp Per  Cardiologist: Dr. Quay Burow Electrophysiologist:  None   Chief Complaint: Coronary artery disease/aortic stenosis  History of Present Illness:    Derrick Compton is a 84 y.o.  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 He was referred by the [facility] clinic for cardiovascular evaluation because of proximity and progressive chest pain. I last saw him in the office 09/22/2017.  His only cardiovascular risk factor is hyperlipidemia. He did have a coronary calcium score performed 10/04/15 which was 181 a subsequent GXT which was normal. He exercises frequently has noticed progressive substernal chest pain with running. I perform Myoview stress testing on 11/11/16 which was entirely normal 2-D echo performed 12/09/16 showed mild aortic stenosis. He studies continued symptoms I ordered a coronary CTA/FFR on 12/24/16 showed a significant lesion in a diagonal branch.  He underwent cardiac catheterization by myself 02/05/17 revealing a  90% ostial high first diagonal branch stenosis which came off orthogonal to the LAD making percutaneous intervention high risk. I do not think that the risk of intervention would be worth potential benefit of revascularization. The patient, because of his relative hypotension and bradycardia is not a good candidate for anginal medications.  Since I saw him back in April of last year he is done well.  He denies chest pain.  He walks 5 to 6 miles a day.  His lipid profile is followed by his PCP on high-dose Crestor.  He has no symptoms of COVID-19.  The patient does not have symptoms concerning for COVID-19 infection (fever, chills, cough, or new shortness of breath).    Past Medical History:  Diagnosis Date  . Anxiety   . BPH (benign prostatic hyperplasia)   . Chest pain   . Hyperlipidemia   . Osteopenia    Past Surgical History:  Procedure Laterality Date  . LEFT HEART CATH AND CORONARY ANGIOGRAPHY N/A 02/05/2017   Procedure: LEFT HEART CATH AND CORONARY ANGIOGRAPHY;  Surgeon: Lorretta Harp, MD;  Location: [facility];  Service: Cardiovascular;  Laterality: N/A;     Current Meds  Medication Sig  . ALPRAZolam (XANAX) 0.5 MG tablet Take 1 tablet (0.5 mg total) by mouth at bedtime as needed.  . Cholecalciferol (VITAMIN D3) 25 MCG (1000 UT) CAPS Vitamin D3 25 mcg (1,000 unit) capsule  Take 1 capsule every day by oral route.  . lithium carbonate 150 MG capsule Take 1 capsule (150 mg total) by mouth at bedtime.  . meclizine (ANTIVERT) 25 MG tablet Take 25 mg by mouth 2 (two) times daily as needed  for dizziness or nausea.   . rosuvastatin (CRESTOR) 40 MG tablet Take 0.5 tablets by mouth daily.  . sertraline (ZOLOFT) 100 MG tablet Take 1 tablet (100 mg total) by mouth daily.     Allergies:   Patient has no known allergies.   Social History   Tobacco Use  . Smoking status: Never Smoker  . Smokeless tobacco: Never Used  Substance Use Topics  . Alcohol use: No  . Drug use: No      Family Hx: The patient's family history is not on file.  ROS:   Please see the history of present illness.     All other systems reviewed and are negative.   Prior CV studies:   The following studies were reviewed today:  None  Labs/Other Tests and Data Reviewed:    EKG:  No ECG reviewed.  Recent Labs: No results found for requested labs within last 8760 hours.   Recent Lipid Panel Lab Results  Component Value Date/Time   CHOL 196 02/04/2011 03:09 PM   TRIG 99 02/04/2011 03:09 PM   HDL 63 02/04/2011 03:09 PM   CHOLHDL 3.1 02/04/2011 03:09 PM   LDLCALC 113 (H) 02/04/2011 03:09 PM    Wt Readings from Last 3 Encounters:  09/22/18 153 lb (69.4 kg)  09/22/17 157 lb (71.2 kg)  08/19/17 158 lb 12.8 oz (72 kg)     Objective:    Vital Signs:  BP 113/65   Pulse (!) 57   Ht 5\' 10"  (1.778 m)   Wt 153 lb (69.4 kg)   BMI 21.95 kg/m    VITAL SIGNS:  reviewed GEN:  no acute distress RESPIRATORY:  normal respiratory effort, symmetric expansion NEURO:  alert and oriented x 3, no obvious focal deficit PSYCH:  normal affect  ASSESSMENT & PLAN:    1. Coronary artery disease- history of CAD by cath 02/05/2017 because of chest pain.  He did have a negative Myoview 11/11/2016 but a positive CT FFR of a diagonal branch 12/24/2016.  His cath revealed a 90% ostial first diagonal branch stenosis.  This came off in a 90 degree bend placing him at high risk for percutaneous intervention.  He had no other significant CAD.  I did elect to treat him medically and he said no recurrent chest pain despite being fairly active and walking 5 to 6 miles a day. 2. Hyperlipidemia- on high-dose Crestor followed by his PCP 3. Aortic stenosis- last 2D echo performed 12/09/2016 revealed mild aortic stenosis with a peak gradient of 21 mmHg.  We will recheck a 2D echocardiogram.  COVID-19 Education: The signs and symptoms of COVID-19 were discussed with the patient and how to seek care for testing  (follow up with PCP or arrange E-visit).  The importance of social distancing was discussed today.  Time:   Today, I have spent 8 minutes with the patient with telehealth technology discussing the above problems.     Medication Adjustments/Labs and Tests Ordered: Current medicines are reviewed at length with the patient today.  Concerns regarding medicines are outlined above.   Tests Ordered: A follow-up 2D echo was ordered for aortic stenosis  Medication Changes: No orders of the defined types were placed in this encounter.   Disposition:  Follow up in 1 year(s)  Signed, Quay Burow, MD  09/22/2018 9:04 AM    [place]

## 2018-09-22 NOTE — Patient Instructions (Signed)
Medication Instructions:  Your physician recommends that you continue on your current medications as directed. Please refer to the Current Medication list given to you today.  If you need a refill on your cardiac medications before your next appointment, please call your pharmacy.   Lab work: NONE If you have labs (blood work) drawn today and your tests are completely normal, you will receive your results only by: [name] MyChart Message (if you have MyChart) OR . A paper copy in the mail If you have any lab test that is abnormal or we need to change your treatment, we will call you to review the results.  Testing/Procedures: Your physician has requested that you have an echocardiogram. Echocardiography is a painless test that uses sound waves to create images of your heart. It provides your doctor with information about the size and shape of your heart and how well your heart's chambers and valves are working. This procedure takes approximately one hour. There are no restrictions for this procedure. LOCATION: [place], [place] [address]  TO BE SCHEDULED FOR 2-3 MONTHS FROM TODAY    Follow-Up: At [facility], you and your health needs are our priority.  As part of our continuing mission to provide you with exceptional heart care, we have created designated Provider Care Teams.  These Care Teams include your primary Cardiologist (physician) and Advanced Practice Providers (APPs -  Physician Assistants and Nurse Practitioners) who all work together to provide you with the care you need, when you need it. You will need a follow up appointment in 12 months with Dr.Berry.  Please call our office 2 months in advance to schedule this appointment.    Any Other Special Instructions Will Be Listed Below (If Applicable). YOU WILL BE CONTACTED BY A CLINICAL PHARMACIST TO DISCUSS REPATHA, A MEDICATION THAT HELPS LOWER "BAD" CHOLESTEROL.

## 2018-10-21 ENCOUNTER — Telehealth (HOSPITAL_COMMUNITY): Payer: Self-pay

## 2018-10-21 NOTE — Telephone Encounter (Signed)

## 2018-10-22 ENCOUNTER — Other Ambulatory Visit: Payer: Self-pay

## 2018-10-22 ENCOUNTER — Ambulatory Visit (HOSPITAL_COMMUNITY): Payer: Medicare Other | Attending: Internal Medicine

## 2018-10-22 DIAGNOSIS — I350 Nonrheumatic aortic (valve) stenosis: Secondary | ICD-10-CM | POA: Diagnosis present

## 2018-11-02 ENCOUNTER — Other Ambulatory Visit: Payer: Self-pay

## 2018-11-02 DIAGNOSIS — I350 Nonrheumatic aortic (valve) stenosis: Secondary | ICD-10-CM

## 2018-11-02 NOTE — Progress Notes (Signed)
Notes recorded by Lorretta Harp, MD on 10/23/2018 at 9:51 AM EDT Nl LV systolic fxn, Diastolic dysf, mild AS, mild dilatation of ascending Thoracic Ao. Repeat 12 months

## 2018-11-29 ENCOUNTER — Other Ambulatory Visit: Payer: Self-pay | Admitting: Psychiatry

## 2018-12-01 ENCOUNTER — Other Ambulatory Visit: Payer: Self-pay

## 2018-12-01 MED ORDER — AMLODIPINE BESYLATE 5 MG PO TABS: 5.0000 mg | ORAL_TABLET | Freq: Every day | ORAL | 2 refills | Status: DC

## 2018-12-01 NOTE — Telephone Encounter (Signed)
Rx(s) sent to pharmacy electronically.  

## 2019-02-08 DIAGNOSIS — H833X9 Noise effects on inner ear, unspecified ear: Secondary | ICD-10-CM | POA: Insufficient documentation

## 2019-02-11 ENCOUNTER — Telehealth: Payer: Self-pay

## 2019-02-11 NOTE — Telephone Encounter (Signed)
Incoming call from front desk. Pt here to pick up results? The only recent results in system are of echo from 10/22/2018. Results printed and provided to pt per request

## 2019-02-22 ENCOUNTER — Ambulatory Visit: Payer: Medicare Other | Admitting: Psychiatry

## 2019-03-14 ENCOUNTER — Ambulatory Visit (INDEPENDENT_AMBULATORY_CARE_PROVIDER_SITE_OTHER): Payer: Medicare Other | Admitting: Internal Medicine

## 2019-03-14 ENCOUNTER — Telehealth: Payer: Self-pay | Admitting: Internal Medicine

## 2019-03-14 ENCOUNTER — Encounter: Payer: Self-pay | Admitting: Internal Medicine

## 2019-03-14 ENCOUNTER — Other Ambulatory Visit: Payer: Self-pay

## 2019-03-14 VITALS — BP 110/70 | HR 66 | Temp 97.0°F | Ht 70.0 in | Wt 155.6 lb

## 2019-03-14 DIAGNOSIS — R911 Solitary pulmonary nodule: Secondary | ICD-10-CM

## 2019-03-14 DIAGNOSIS — R918 Other nonspecific abnormal finding of lung field: Secondary | ICD-10-CM | POA: Diagnosis not present

## 2019-03-14 DIAGNOSIS — I2510 Atherosclerotic heart disease of native coronary artery without angina pectoris: Secondary | ICD-10-CM | POA: Diagnosis not present

## 2019-03-14 NOTE — Patient Instructions (Signed)
ICD-10-CM   1. Pulmonary infiltrate present on computed tomography  R91.8     Small infiltrate seen on CT chest 02/23/2019 from [facility]  - do repeat ct chest wtihout contrast around end of dec 2020   Followup  - end dec 2020 but after CT chest

## 2019-03-14 NOTE — Telephone Encounter (Signed)
Derrick Compton  This patient Derrick Compton  is uspset that he dropped off CD rom of his CT chest but this is NA for my visulaization 03/14/2019 . Derrick Compton looked everywhere but could not find it.  Please facilitate  Thanks    SIGNATURE    Dr. Brand Males, M.D., F.C.C.P,  Pulmonary and Critical Care Medicine Staff Physician, [place] Director - Interstitial Lung Disease  Program  Pulmonary [place] at [place], [place], [address]  Pager: [phone], If no answer or between  15:00h - 7:00h: call [phone] Telephone: [phone]  9:33 AM 03/14/2019

## 2019-03-14 NOTE — Progress Notes (Signed)
OV 03/14/2019  Subjective:  Patient ID: Derrick Compton, male , DOB: Jul 15, 1934 , age 84 y.o. , MRN: [id number] , ADDRESS: [place] [address]   03/14/2019 -   Chief Complaint  Patient presents with  . Consult    Abnormal CT      HPI Derrick Compton 83 y.o. -presents for evaluation of pulmonary infiltrate/nodule.  He tells me and this is confirmed by review of the outside medical records in [place].  In January 2020 he had pneumonia symptoms.  He was given antibiotics.  He did have a follow-up film and that showed pulmonary infiltrates according to his history and the right upper lobe.  He thinks he had a follow-up CT scan of the chest in April 2020 but I do not see this in the outside records.  Insert all I see is the outside CT chest August 17, 2018 that show some right upper lobe infiltrates that has been reported as residua from his pneumonia.  But at this time he started feeling well.  He has continued to feel well.  He says he never had COVID-19.  He has never been tested for COVID-19.  He spends his time shortening between [place] and [place] with his RV.  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  He states in September 2020 he went to the [facility] for a full physical.  He says he had a CT scan of the chest there.  Review of the chart indicates that he had a CT scan of the coronaries [he is already had CT scan of the coronaries in 2018 within our health system and this did not show any pulmonary infiltrates back pain].  I do not have the images with me.  On the official report this seems to be some residual mild infiltrates and the radiologist recommended a follow-up in 3 months which would be around Christmas 2020.  At this point in time he has no shortness of breath or cough or fever.  His ECOG is 0.  He is fit and functional.  Works out daily with walking exercises.  Of note: He did bring his CD-ROM images and dropped off in our  front desk a few days ago but at this point in time it is missing.  He is quite upset about this.     ROS - per HPI  March 2020 CT image [facility] - result only, image NA   IMPRESSION: 1. Residual patchy right upper and lower lobe ground-glass opacities, improved from baseline radiographs of 06/13/2018, most consistent with resolving acute atypical infection, including viral pneumonia. 2. No consolidation, pleural effusion or adenopathy. 3. Assuming the patient is clinically improving, chest radiographic follow-up in approximately 1 month suggested. 4. Coronary and Aortic Atherosclerosis (ICD10-I70.0).   Electronically Signed  By: Richardean Sale M.D.  On: 08/17/2018 15:24   CT [facility] Feb 23, 2019 - [facility] - Coronary CT   IMPRESSION:  1. Coronary Agatston Calcium Score of 196. Please correlate with MESA   (multi-ethnic study of atherosclerosis) or use website risk calculator.   ** http://www.mesa-nhlbi.org/Calcium/input.aspx  2. Minimal nonspecific groundglass opacities in the right lung, likely   infectious in etiology. Follow-up with CT chest in 3 months is   recommended to assess for resolution.  3. Bilateral nonobstructive nephrolithiasis, left greater than right.  4. Status post right hemicolectomy with ileocolonic anastomosis.  Transcriptionist: PSCB   Transcribe Date/Time: Feb 23 2019 2:32P    Dictated by : Nash Shearer, MD    This examination was interpreted and the report reviewed and   electronically signed by:   Nash Shearer, MD on Feb 23 2019 2:46PM EST   has a past medical history of Anxiety, BPH (benign prostatic hyperplasia), Chest pain, Hyperlipidemia, and Osteopenia.   reports that he has never smoked. He has never used smokeless tobacco.  Past Surgical History:  Procedure Laterality Date  . LEFT HEART CATH AND CORONARY ANGIOGRAPHY N/A 02/05/2017   Procedure: LEFT HEART CATH AND  CORONARY ANGIOGRAPHY;  Surgeon: Lorretta Harp, MD;  Location: [facility];  Service: Cardiovascular;  Laterality: N/A;    No Known Allergies   There is no immunization history on file for this patient.  No family history on file.   Current Outpatient Medications:  .  ALPRAZolam (XANAX) 0.5 MG tablet, Take 1 tablet (0.5 mg total) by mouth at bedtime as needed., Disp: 90 tablet, Rfl: 1 .  amLODipine (NORVASC) 5 MG tablet, Take 1 tablet (5 mg total) by mouth daily., Disp: 90 tablet, Rfl: 2 .  Cholecalciferol (VITAMIN D3) 25 MCG (1000 UT) CAPS, Vitamin D3 25 mcg (1,000 unit) capsule  Take 1 capsule every day by oral route., Disp: , Rfl:  .  lithium carbonate 150 MG capsule, Take 1 capsule (150 mg total) by mouth at bedtime., Disp: 90 capsule, Rfl: 1 .  meclizine (ANTIVERT) 25 MG tablet, Take 25 mg by mouth 2 (two) times daily as needed for dizziness or nausea. , Disp: , Rfl: 0 .  rosuvastatin (CRESTOR) 40 MG tablet, Take 0.5 tablets by mouth daily., Disp: , Rfl: 3 .  sertraline (ZOLOFT) 100 MG tablet, TAKE 1 TABLET BY MOUTH EVERY DAY, Disp: 90 tablet, Rfl: 0      Objective:   Vitals:   03/14/19 0900  BP: 110/70  Pulse: 66  Temp: (!) 97 F (36.1 C)  TempSrc: Temporal  SpO2: 97%  Weight: 155 lb 9.6 oz (70.6 kg)  Height: 5\' 10"  (1.778 m)    Estimated body mass index is 22.33 kg/m as calculated from the following:   Height as of this encounter: 5\' 10"  (1.778 m).   Weight as of this encounter: 155 lb 9.6 oz (70.6 kg).  @WEIGHTCHANGE @  [employer]   03/14/19 0900  Weight: 155 lb 9.6 oz (70.6 kg)     Physical Exam  General Appearance:    Alert, cooperative, no distress, appears stated age - yes , Deconditioned looking - no , OBESE  - no, Sitting on Wheelchair -  no  Head:    Normocephalic, without obvious abnormality, atraumatic  Eyes:    PERRL, conjunctiva/corneas clear,  Ears:    Normal TM's and external ear canals, both ears  Nose:   Nares normal, septum  midline, mucosa normal, no drainage    or sinus tenderness. OXYGEN ON  - no . Patient is @ ra   Throat:   Lips, mucosa, and tongue normal; teeth and gums normal. Cyanosis on lips - no  Neck:   Supple, symmetrical, trachea midline, no adenopathy;    thyroid:  no enlargement/tenderness/nodules; no carotid   bruit or JVD  Back:     Symmetric, no curvature, ROM normal, no CVA tenderness  Lungs:     Distress - no , Wheeze no, Barrell Chest - no, Purse lip breathing - no, Crackles - no   Chest  Wall:    No tenderness or deformity.    Heart:    Regular rate and rhythm, S1 and S2 normal, no rub   or gallop, Murmur - no  Breast Exam:    NOT DONE  Abdomen:     Soft, non-tender, bowel sounds active all four quadrants,    no masses, no organomegaly. Visceral obesity - no  Genitalia:   NOT DONE  Rectal:   NOT DONE  Extremities:   Extremities - normal, Has Cane - no, Clubbing - no, Edema - no  Pulses:   2+ and symmetric all extremities  Skin:   Stigmata of Connective Tissue Disease - no  Lymph nodes:   Cervical, supraclavicular, and axillary nodes normal  Psychiatric:  Neurologic:   Pleasant - yes, Anxious - no, Flat affect - no  CAm-ICU - neg, Alert and Oriented x 3 - yes, Moves all 4s - yes, Speech - normal, Cognition - intact           Assessment:       ICD-10-CM   1. Pulmonary infiltrate present on computed tomography  R91.8        Plan:     Patient Instructions     ICD-10-CM   1. Pulmonary infiltrate present on computed tomography  R91.8     Small infiltrate seen on CT chest 02/23/2019 [facility]  - do repeat ct chest wtihout contrast around end of dec 2020   Followup  - end dec 2020 but after CT chest     SIGNATURE    Dr. Brand Males, M.D., F.C.C.P,  Pulmonary and Critical Care Medicine Staff Physician, [place] Director - Interstitial Lung Disease  Program  Pulmonary [place] at  [place], [place], [address]  Pager: [phone], If no answer or between  15:00h - 7:00h: call [phone] Telephone: [phone]  9:29 AM 03/14/2019

## 2019-03-14 NOTE — Addendum Note (Signed)
Addended by: Vivia Ewing on: 03/14/2019 09:33 AM   Modules accepted: Orders

## 2019-03-17 ENCOUNTER — Encounter: Payer: Self-pay | Admitting: Psychiatry

## 2019-03-17 ENCOUNTER — Ambulatory Visit (INDEPENDENT_AMBULATORY_CARE_PROVIDER_SITE_OTHER): Payer: Medicare Other | Admitting: Psychiatry

## 2019-03-17 ENCOUNTER — Other Ambulatory Visit: Payer: Self-pay

## 2019-03-17 DIAGNOSIS — I2510 Atherosclerotic heart disease of native coronary artery without angina pectoris: Secondary | ICD-10-CM

## 2019-03-17 DIAGNOSIS — F411 Generalized anxiety disorder: Secondary | ICD-10-CM

## 2019-03-17 DIAGNOSIS — F331 Major depressive disorder, recurrent, moderate: Secondary | ICD-10-CM

## 2019-03-17 DIAGNOSIS — F4001 Agoraphobia with panic disorder: Secondary | ICD-10-CM

## 2019-03-17 DIAGNOSIS — F5105 Insomnia due to other mental disorder: Secondary | ICD-10-CM

## 2019-03-17 MED ORDER — SERTRALINE HCL 50 MG PO TABS
150.0000 mg | ORAL_TABLET | Freq: Every day | ORAL | 1 refills | Status: DC
Start: 2019-03-17 — End: 2019-04-26

## 2019-03-17 NOTE — Patient Instructions (Signed)
increase sertraline to 150 mg daily.  Either as 100 mg tablet plus a 50 mg tablet or 1-1/2 of the 100 mg tablets or 3 of the 50 mg tablets

## 2019-03-17 NOTE — Progress Notes (Signed)
Derrick Compton [id number] 06/16/1934 84 y.o.  Subjective:   Patient ID:  Derrick Compton is a 84 y.o. (DOB 1935/04/01) male.  Chief Complaint:  Chief Complaint  Patient presents with  . Follow-up    Medication Management  . Depression    Medication Management    Depression        Associated symptoms include no decreased concentration and no suicidal ideas.  Derrick Compton presents to the office today for follow-up of TRD  And panic and GAD.   At  visit in December he was depressed.  Started mirtazapine and very low dose lithium.  Awakens with a little depression.  Sleep is pretty good and rarely needs bz.  Depression better after a week or so.  Tolerating meds ok.  Not gaining weight like when on mirtazapine before.   When last seen July 28, 2018.  No meds were changed. However at some point he discontinued lithium 150 mg daily.  He stopped bc didn't seem to be doing anything and read about all the side effects.  Onset of crazy dreams and swinging arms at night.  Normal quantity.  When awakens feels more anxious and down.  Struggle until up and doing things.  Sx onset about 2 1/2 mos ago.  Feels normal again after lunch.  Overall more anhedonia.  Rare alprazolam.  Business doing well.  Notices calming effect from sertraline.  Fewer speeding tickets. Patient reports stable mood and denies depressed or irritable moods.  Patient denies any recent difficulty with anxiety.  Patient denies difficulty with sleep initiation or maintenance. Denies appetite disturbance.  Patient reports that energy and motivation have been good.  Patient denies any difficulty with concentration.  Patient denies any suicidal ideation.  Past psychiatric medications Wellbutrin 150, citalopram, paroxetine, Deplin, lorazepam, fluoxetine side effects, buspirone, Abilify 2 mg, Rexulti 2 mg, sertraline 100  Review of Systems:  Review of Systems  Constitutional: Negative for activity change and unexpected weight  change.  Neurological: Negative for tremors and weakness.  Psychiatric/Behavioral: Positive for depression. Negative for agitation, behavioral problems, confusion, decreased concentration, dysphoric mood, hallucinations, self-injury, sleep disturbance and suicidal ideas. The patient is not nervous/anxious and is not hyperactive.     Medications: I have reviewed the patient's current medications.  Current Outpatient Medications  Medication Sig Dispense Refill  . ALPRAZolam (XANAX) 0.5 MG tablet Take 0.5 mg by mouth at bedtime as needed.     [name] aspirin EC 81 MG tablet Take 81 mg by mouth daily.     . Cholecalciferol (VITAMIN D3) 25 MCG (1000 UT) CAPS Vitamin D3 25 mcg (1,000 unit) capsule  Take 1 capsule every day by oral route.    . finasteride (PROSCAR) 5 MG tablet finasteride 5 mg tablet    . Magnesium 250 MG TABS Take 250 mg by mouth daily.     . meclizine (ANTIVERT) 25 MG tablet Take 25 mg by mouth 2 (two) times daily as needed for dizziness or nausea.   0  . mirtazapine (REMERON) 30 MG tablet     . rosuvastatin (CRESTOR) 40 MG tablet Take 0.5 tablets by mouth daily.  3  . sertraline (ZOLOFT) 50 MG tablet Take 3 tablets (150 mg total) by mouth daily. 135 tablet 1   No current facility-administered medications for this visit.     Medication Side Effects: None  Allergies: No Known Allergies  Past Medical History:  Diagnosis Date  . Anxiety   . BPH (benign prostatic hyperplasia)   .  Chest pain   . Hyperlipidemia   . Osteopenia     History reviewed. No pertinent family history.  Social History   Socioeconomic History  . Marital status: Married    Spouse name: Not on file  . Number of children: Not on file  . Years of education: Not on file  . Highest education level: Not on file  Occupational History  . Not on file  Social Needs  . Financial resource strain: Not on file  . Food insecurity    Worry: Not on file    Inability: Not on file  . Transportation needs     Medical: Not on file    Non-medical: Not on file  Tobacco Use  . Smoking status: Never Smoker  . Smokeless tobacco: Never Used  Substance and Sexual Activity  . Alcohol use: No  . Drug use: No  . Sexual activity: Not on file  Lifestyle  . Physical activity    Days per week: Not on file    Minutes per session: Not on file  . Stress: Not on file  Relationships  . Social Herbalist on phone: Not on file    Gets together: Not on file    Attends religious service: Not on file    Active member of club or organization: Not on file    Attends meetings of clubs or organizations: Not on file    Relationship status: Not on file  . Intimate partner violence    Fear of current or ex partner: Not on file    Emotionally abused: Not on file    Physically abused: Not on file    Forced sexual activity: Not on file  Other Topics Concern  . Not on file  Social History Narrative  . Not on file    Past Medical History, Surgical history, Social history, and Family history were reviewed and updated as appropriate.   Please see review of systems for further details on the patient's review from today.   Objective:   Physical Exam:  There were no vitals taken for this visit.  Physical Exam Constitutional:      General: He is not in acute distress.    Appearance: He is well-developed.  Musculoskeletal:        General: No deformity.  Neurological:     Mental Status: He is alert and oriented to person, place, and time.     Motor: No tremor.     Coordination: Coordination normal.     Gait: Gait normal.  Psychiatric:        Attention and Perception: He is attentive. He does not perceive auditory hallucinations.        Mood and Affect: Mood is not anxious or depressed. Affect is not labile, blunt, angry or inappropriate.        Speech: Speech normal.        Behavior: Behavior normal.        Thought Content: Thought content normal. Thought content does not include homicidal or  suicidal ideation. Thought content does not include homicidal or suicidal plan.        Cognition and Memory: Cognition normal.        Judgment: Judgment normal.     Comments: Insight and judgment fair. No auditory or visual hallucinations. No delusions.  Ruminating stopped.     Lab Review:     Component Value Date/Time   NA 143 01/27/2017 0843   K 4.2 01/27/2017 0843  CL 102 01/27/2017 0843   CO2 23 01/27/2017 0843   GLUCOSE 87 01/27/2017 0843   GLUCOSE 108 (H) 02/11/2011 0533   BUN 21 01/27/2017 0843   CREATININE 0.99 01/27/2017 0843   CALCIUM 9.7 01/27/2017 0843   PROT 6.9 02/04/2011 0840   ALBUMIN 3.8 02/04/2011 0840   AST 18 02/04/2011 0840   ALT 18 02/04/2011 0840   ALKPHOS 75 02/04/2011 0840   BILITOT 0.6 02/04/2011 0840   GFRNONAA 72 01/27/2017 0843   GFRAA 83 01/27/2017 0843       Component Value Date/Time   WBC 5.7 01/27/2017 0843   WBC 5.6 02/09/2011 0510   RBC 4.69 01/27/2017 0843   RBC 4.34 02/09/2011 0510   HGB 14.7 01/27/2017 0843   HCT 43.4 01/27/2017 0843   PLT 205 01/27/2017 0843   MCV 93 01/27/2017 0843   MCH 31.3 01/27/2017 0843   MCH 31.3 02/09/2011 0510   MCHC 33.9 01/27/2017 0843   MCHC 35.1 02/09/2011 0510   RDW 13.3 01/27/2017 0843   LYMPHSABS 1.6 01/27/2017 0843   MONOABS 0.8 02/05/2011 0340   EOSABS 0.2 01/27/2017 0843   BASOSABS 0.0 01/27/2017 0843    No results found for: POCLITH, LITHIUM   No results found for: PHENYTOIN, PHENOBARB, VALPROATE, CBMZ   .res Assessment: Plan:    Major depressive disorder, recurrent episode, moderate (HCC) - Plan: sertraline (ZOLOFT) 50 MG tablet  Generalized anxiety disorder  Panic disorder with agoraphobia  Insomnia due to mental condition   Hx multiple recurrences of depression some of which were due to to noncompliance with medication..  Many of the recurrences have been due to noncompliance for various reasons.  He is medication sensitive which complicates things.    He has relapsed  again and the only change is he stopped lithium.  He doesn't think it helped but I question that. He prefers to increase sertraline over restarting lithium.  Tried to address his fears about lithium. Option increase mirtazapine but may lose sleep benefit. Increase sertraline to 50 mg tabs 3 daily for total of 150 mg daily.  He agrees to continue the current medication without making changes on his own.  Discussed the risk of relapse if he does. Do not change AMA.  FU 2 mos  Lynder Parents, MD, DFAPA   Please see After Visit Summary for patient specific instructions.  No future appointments.  No orders of the defined types were placed in this encounter.     -------------------------------

## 2019-03-20 ENCOUNTER — Other Ambulatory Visit: Payer: Self-pay | Admitting: Psychiatry

## 2019-03-21 NOTE — Telephone Encounter (Signed)
We have looked in all the places we know of. I have been unable to locate CD.  Dr. Chase Caller were you able to find it mixed in your paperwork possibly?

## 2019-03-21 NOTE — Telephone Encounter (Signed)
After I sent this message only thing that the CMA of that day could find is the paper result of the CT and not the CD. Is possible patient Derrick Compton confused between image and report. You can report that we found report via paper . In any event he wil be having a dec 2020 ct chest based on the outside report.

## 2019-04-04 ENCOUNTER — Telehealth: Payer: Self-pay | Admitting: Cardiovascular Disease

## 2019-04-04 NOTE — Telephone Encounter (Signed)
Patient does mention only when doing activites he notices SOB, and chest pains, patient states it is not at rest- he states it has been going on about 3 weeks- worsening.  Patient denies swelling in hands/feet.   I advised it would be a good idea to have a visit- made visit with NP on Wednesday at 9:30- advised to come alone and wear a mask. Also if chest pain came back at rest or worsened to call 911 or go to ER. Also advised to take it easy over the next few days and do no strenuous activities. Patient verbalized understanding. Will route to MD to make aware.

## 2019-04-04 NOTE — Telephone Encounter (Signed)
Pt c/o Shortness Of Breath: STAT if SOB developed within the last 24 hours or pt is noticeably SOB on the phone  1. Are you currently SOB (can you hear that pt is SOB on the phone)? no  2. How long have you been experiencing SOB? 3-4 weeks  3. Are you SOB when sitting or when up moving around? Only when he is active  4. Are you currently experiencing any other symptoms? Chest pain and lightheaded

## 2019-04-05 NOTE — Progress Notes (Signed)
Cardiology Clinic Note   Patient Name: Derrick Compton Date of Encounter: 04/06/2019  Primary Care Provider:  Patient, No Pcp Per Primary Cardiologist:  Derrick Burow, MD  Patient Profile    Derrick Compton 84 year old male presents today for an evaluation of his shortness of breath that has progressed over the last 3 to 4 weeks.  Past Medical History    Past Medical History:  Diagnosis Date  . Anxiety   . BPH (benign prostatic hyperplasia)   . Chest pain   . Hyperlipidemia   . Osteopenia    Past Surgical History:  Procedure Laterality Date  . LEFT HEART CATH AND CORONARY ANGIOGRAPHY N/A 02/05/2017   Procedure: LEFT HEART CATH AND CORONARY ANGIOGRAPHY;  Surgeon: Derrick Harp, MD;  Location: [facility];  Service: Cardiovascular;  Laterality: N/A;    Allergies  No Known Allergies  History of Present Illness    Derrick Compton has a past medical history of coronary artery disease with cardiac catheterization on 02/05/2017 that showed 90% ostial first diagonal branch stenosis coming off  orthogonal to the LAD making PCI high risk.  His cardiac catheterization showed no other significant CAD and medical management was chosen as the best course of therapy.  He had a stress test on 10/2016 which was negative however, he did have a positive CT FFR of a diagonal branch 12/2016 due to his hypotension and bradycardia he was not a good candidate for anti-anginal medications.  An echocardiogram on 5/29 /2020 showed an EF of 55 to 60% and mild stable aortic valve stenosis with a peak gradient of 15 mmHg.  He also has hyperlipidemia which is managed by his PCP.  He had pneumonia symptoms January 2020, was given antibiotics and his follow-up film indicated pulmonary infiltrates in the right upper lobe.  Seen by Dr. Chase Compton on 03/14/2019.  He reviewed a chest CT from 02/23/2019 that was taken at the [facility] clinic in [place] which showed small infiltrates.  He plans to repeat CT chest  without contrast at the end of December 2020.  He was last seen by Dr. Gwenlyn Compton on 09/22/2018.  During that time he was doing well.  He denied chest pain and continued to walk 5 to 6 miles daily.  He presents the clinic today and states he has chest discomfort when he tries to run or run upstairs.  When he slows his pace or starts to walk his chest discomfort goes away.  He continues to walk 5 to 6 miles per day with no chest discomfort.  He states that over the last 6 months he has noticed with more depression.  He has been working with a psychiatrist that is prescribing sertraline.  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  I will start Ranexa 500 mg twice daily today and have him return in 2 weeks for a BMP.  I will also give him stress reduction techniques and advised him to go to counseling to see if it will help with his depression.  He denies chest pain with routine activities and notices chest discomfort with vigorous physical activity, increased shortness of breath, lower extremity edema, fatigue, palpitations, melena, hematuria, hemoptysis, diaphoresis, weakness, presyncope, syncope, orthopnea, and PND.  Home Medications    Prior to Admission medications   Medication Sig Start Date End Date Taking? Authorizing Provider  ALPRAZolam Duanne Moron) 0.5 MG tablet Take 0.5 mg by mouth at  bedtime as needed.  03/01/14   [provider]  aspirin EC 81 MG tablet Take 81 mg by mouth daily.     [provider]  Cholecalciferol (VITAMIN D3) 25 MCG (1000 UT) CAPS Vitamin D3 25 mcg (1,000 unit) capsule  Take 1 capsule every day by oral route.    [provider]  finasteride (PROSCAR) 5 MG tablet finasteride 5 mg tablet    [provider]  Magnesium 250 MG TABS Take 250 mg by mouth daily.     [provider]  meclizine (ANTIVERT) 25 MG tablet Take 25 mg by mouth 2 (two) times daily as needed for  dizziness or nausea.  09/02/16   [provider]  mirtazapine (REMERON) 30 MG tablet TAKE 1 TABLET BY MOUTH AT BEDTIME 03/20/19   Cottle, Billey Co., MD  rosuvastatin (CRESTOR) 40 MG tablet Take 0.5 tablets by mouth daily. 09/22/16   [provider]  sertraline (ZOLOFT) 50 MG tablet Take 3 tablets (150 mg total) by mouth daily. 03/17/19   Cottle, Billey Co., MD    Family History    No family history on file. He indicated that his mother is deceased. He indicated that his father is deceased.  Social History    Social History   Socioeconomic History  . Marital status: Married    Spouse name: Not on file  . Number of children: Not on file  . Years of education: Not on file  . Highest education level: Not on file  Occupational History  . Not on file  Social Needs  . Financial resource strain: Not on file  . Food insecurity    Worry: Not on file    Inability: Not on file  . Transportation needs    Medical: Not on file    Non-medical: Not on file  Tobacco Use  . Smoking status: Never Smoker  . Smokeless tobacco: Never Used  Substance and Sexual Activity  . Alcohol use: No  . Drug use: No  . Sexual activity: Not on file  Lifestyle  . Physical activity    Days per week: Not on file    Minutes per session: Not on file  . Stress: Not on file  Relationships  . Social Herbalist on phone: Not on file    Gets together: Not on file    Attends religious service: Not on file    Active member of club or organization: Not on file    Attends meetings of clubs or organizations: Not on file    Relationship status: Not on file  . Intimate partner violence    Fear of current or ex partner: Not on file    Emotionally abused: Not on file    Physically abused: Not on file    Forced sexual activity: Not on file  Other Topics Concern  . Not on file  Social History Narrative  . Not on file     Review of Systems    General:  No chills, fever, night sweats  or weight changes.  Cardiovascular:  No chest pain, dyspnea on exertion, edema, orthopnea, palpitations, paroxysmal nocturnal dyspnea. Dermatological: No rash, lesions/masses Respiratory: No cough, dyspnea Urologic: No hematuria, dysuria Abdominal:   No nausea, vomiting, diarrhea, bright red blood per rectum, melena, or hematemesis Neurologic:  No visual changes, wkns, changes in mental status. All other systems reviewed and are otherwise negative except as noted above.  Physical Exam    VS:  BP (!) 94/54   Pulse (!) 52   Temp 98.2 F (36.8 C)   Ht 5\' 10"  (1.778 m)   Wt 156 lb 6.4 oz (70.9 kg)   SpO2 95%   BMI 22.44 kg/m  , BMI Body mass index is 22.44 kg/m. GEN: Well nourished, well developed, in no acute distress. HEENT: normal. Neck: Supple, no JVD, carotid bruits, or masses. Cardiac: RRR, no murmurs, rubs, or gallops. No clubbing, cyanosis, edema.  Radials/DP/PT 2+ and equal bilaterally.  Respiratory:  Respirations regular and unlabored, clear to auscultation bilaterally. GI: Soft, nontender, nondistended, BS + x 4. MS: no deformity or atrophy. Skin: warm and dry, no rash. Neuro:  Strength and sensation are intact. Psych: Normal affect.  Accessory Clinical Findings    ECG personally reviewed by me today-sinus bradycardia premature atrial complexes 52 bpm- No acute changes  EKG 08/19/2017 Sinus bradycardia with premature atrial complexes, incomplete right bundle branch block, left anterior fascicular block 52 bpm  Echocardiogram 10/22/2018 IMPRESSIONS    1. The left ventricle has normal systolic function, with an ejection fraction of 55-60%. The cavity size was normal. Left ventricular diastolic Doppler parameters are consistent with impaired relaxation. Indeterminate filling pressures.  2. The right ventricle has normal systolic function. The cavity was normal. There is no increase in right ventricular wall thickness. Right ventricular systolic pressure could not be  assessed.  3. The mitral valve is abnormal. Mild thickening of the mitral valve leaflet. There is mild mitral annular calcification present.  4. The aortic valve is abnormal. Mild calcification of the aortic valve. Aortic valve regurgitation is trivial by color flow Doppler. Mild stenosis of the aortic valve. Maximum velocity obtained in the apical view.  5. AV Mean Sys Grad:9.0 mmHg.  6. There is mild dilatation of the ascending aorta measuring 41 mm.  Assessment & Plan    Shortness of breath- has noticed increase in shortness of breath over the last 3 to 4 weeks.  He had pneumonia symptoms January 2020, was given antibiotics and his follow-up film indicated pulmonary infiltrates in the right upper lobe.  Seen by Dr. Chase Compton on 03/14/2019.  He reviewed a chest CT from 02/23/2019 that was taken at the [facility] clinic in [place] which showed small infiltrates.  He plans to repeat CT chest without contrast at the end of December 2020. Followed by pulmonology   Coronary artery disease-no chest pain today.  Cardiac catheterization on 02/05/2017 that showed 90% ostial first diagonal branch stenosis coming off  orthogonal to the LAD making PCI high risk.  His cardiac catheterization showed no other significant CAD and medical management was chosen as the best course of therapy.  EKG today sinus bradycardia premature atrial complexes 52 bpm. Continue aspirin 81 mg tablet daily Continue rosuvastatin 20 mg tablet daily Start Ranexa 500 mg twice daily  Stress reduction techniques-information given, follow-up/find  CBT counselor. Order BMP in 2 weeks Case discussed with Dr. Gwenlyn Compton  Hyperlipidemia-LDL 113 02/04/2011 Continue rosuvastatin 20 mg tablet daily Followed by PCP  Aortic stenosis-echocardiogram on 12/09/2016 showed mild aortic stenosis with a peak gradient of 21 mmHg.  His repeat echocardiogram on 10/22/2018 showed an EF of 55 to 60% with stable aortic valve stenosis and a peak gradient of 15 mmHg   Disposition: Follow-up with me in 2 weeks.  Jossie Ng. [facility] [place] [address] Office [phone] Fax [phone]

## 2019-04-06 ENCOUNTER — Other Ambulatory Visit: Payer: Self-pay

## 2019-04-06 ENCOUNTER — Ambulatory Visit (INDEPENDENT_AMBULATORY_CARE_PROVIDER_SITE_OTHER): Payer: Medicare Other | Admitting: General Practice

## 2019-04-06 ENCOUNTER — Encounter: Payer: Self-pay | Admitting: General Practice

## 2019-04-06 VITALS — BP 94/54 | HR 52 | Temp 98.2°F | Ht 70.0 in | Wt 156.4 lb

## 2019-04-06 DIAGNOSIS — R079 Chest pain, unspecified: Secondary | ICD-10-CM

## 2019-04-06 DIAGNOSIS — E782 Mixed hyperlipidemia: Secondary | ICD-10-CM

## 2019-04-06 DIAGNOSIS — I2510 Atherosclerotic heart disease of native coronary artery without angina pectoris: Secondary | ICD-10-CM

## 2019-04-06 DIAGNOSIS — Z79899 Other long term (current) drug therapy: Secondary | ICD-10-CM

## 2019-04-06 DIAGNOSIS — I350 Nonrheumatic aortic (valve) stenosis: Secondary | ICD-10-CM | POA: Diagnosis not present

## 2019-04-06 DIAGNOSIS — E7800 Pure hypercholesterolemia, unspecified: Secondary | ICD-10-CM

## 2019-04-06 DIAGNOSIS — R0602 Shortness of breath: Secondary | ICD-10-CM

## 2019-04-06 MED ORDER — RANOLAZINE ER 500 MG PO TB12: 500.0000 mg | ORAL_TABLET | Freq: Two times a day (BID) | ORAL | 0 refills | Status: DC

## 2019-04-06 NOTE — Patient Instructions (Addendum)
Medication Instructions:   START RANEXA 500 MG 2 TIMES A DAY  *If you need a refill on your cardiac medications before your next appointment, please call your pharmacy*  Lab Work:  You will need to have labs (blood work) drawn in 2 weeks:  BMET If you have labs (blood work) drawn today and your tests are completely normal, you will receive your results only by: [name] MyChart Message (if you have MyChart) OR . A paper copy in the mail If you have any lab test that is abnormal or we need to change your treatment, we will call you to review the results.  Testing/Procedures: NONE ordered at this time of appointment   Follow-Up: At [facility], you and your health needs are our priority.  As part of our continuing mission to provide you with exceptional heart care, we have created designated Provider Care Teams.  These Care Teams include your primary Cardiologist (physician) and Advanced Practice Providers (APPs -  Physician Assistants and Nurse Practitioners) who all work together to provide you with the care you need, when you need it.  Your next appointment:   2 WEEKS   The format for your next appointment:   In Person  Provider:   Coletta Memos, FNP  Other Instructions  Your Nurse Practitioner recommends that you give your therapist a call for an appointment    Mindfulness-Based Stress Reduction Mindfulness-based stress reduction (MBSR) is a program that helps people learn to practice mindfulness. Mindfulness is the practice of intentionally paying attention to the present moment. It can be learned and practiced through techniques such as education, breathing exercises, meditation, and yoga. MBSR includes several mindfulness techniques in one program. MBSR works best when you understand the treatment, are willing to try new things, and can commit to spending time practicing what you learn. MBSR training may include learning about:  How your emotions, thoughts, and reactions  affect your body.  New ways to respond to things that cause negative thoughts to start (triggers).  How to notice your thoughts and let go of them.  Practicing awareness of everyday things that you normally do without thinking.  The techniques and goals of different types of meditation. What are the benefits of MBSR? MBSR can have many benefits, which include helping you to:  Develop self-awareness. This refers to knowing and understanding yourself.  Learn skills and attitudes that help you to participate in your own health care.  Learn new ways to care for yourself.  Be more accepting about how things are, and let things go.  Be less judgmental and approach things with an open mind.  Be patient with yourself and trust yourself more. MBSR has also been shown to:  Reduce negative emotions, such as depression and anxiety.  Improve memory and focus.  Change how you sense and approach pain.  Boost your body's ability to fight infections.  Help you connect better with other people.  Improve your sense of well-being. Follow these instructions at home:   Find a local in-person or online MBSR program.  Set aside some time regularly for mindfulness practice.  Find a mindfulness practice that works best for you. This may include one or more of the following: ? Meditation. Meditation involves focusing your mind on a certain thought or activity. ? Breathing awareness exercises. These help you to stay present by focusing on your breath. ? Body scan. For this practice, you lie down and pay attention to each part of your body from head  to toe. You can identify tension and soreness and intentionally relax parts of your body. ? Yoga. Yoga involves stretching and breathing, and it can improve your ability to move and be flexible. It can also provide an experience of testing your body's limits, which can help you release stress. ? Mindful eating. This way of eating involves focusing on  the taste, texture, color, and smell of each bite of food. Because this slows down eating and helps you feel full sooner, it can be an important part of a weight-loss plan.  Find a podcast or recording that provides guidance for breathing awareness, body scan, or meditation exercises. You can listen to these any time when you have a free moment to rest without distractions.  Follow your treatment plan as told by your health care provider. This may include taking regular medicines and making changes to your diet or lifestyle as recommended. How to practice mindfulness To do a basic awareness exercise:  Find a comfortable place to sit.  Pay attention to the present moment. Observe your thoughts, feelings, and surroundings just as they are.  Avoid placing judgment on yourself, your feelings, or your surroundings. Make note of any judgment that comes up, and let it go.  Your mind may wander, and that is okay. Make note of when your thoughts drift, and return your attention to the present moment. To do basic mindfulness meditation:  Find a comfortable place to sit. This may include a stable chair or a firm floor cushion. ? Sit upright with your back straight. Let your arms fall next to your side with your hands resting on your legs. ? If sitting in a chair, rest your feet flat on the floor. ? If sitting on a cushion, cross your legs in front of you.  Keep your head in a neutral position with your chin dropped slightly. Relax your jaw and rest the tip of your tongue on the roof of your mouth. Drop your gaze to the floor. You can close your eyes if you like.  Breathe normally and pay attention to your breath. Feel the air moving in and out of your nose. Feel your belly expanding and relaxing with each breath.  Your mind may wander, and that is okay. Make note of when your thoughts drift, and return your attention to your breath.  Avoid placing judgment on yourself, your feelings, or your  surroundings. Make note of any judgment or feelings that come up, let them go, and bring your attention back to your breath.  When you are ready, lift your gaze or open your eyes. Pay attention to how your body feels after the meditation. Where to find more information You can find more information about MBSR from:  Your health care provider.  Community-based meditation centers or programs.  Programs offered near you. Summary  Mindfulness-based stress reduction (MBSR) is a program that teaches you how to intentionally pay attention to the present moment. It is used with other treatments to help you cope better with daily stress, emotions, and pain.  MBSR focuses on developing self-awareness, which allows you to respond to life stress without judgment or negative emotions.  MBSR programs may involve learning different mindfulness practices, such as breathing exercises, meditation, yoga, body scan, or mindful eating. Find a mindfulness practice that works best for you, and set aside time for it on a regular basis. This information is not intended to replace advice given to you by your health care provider. Make sure you  discuss any questions you have with your health care provider. Document Released: 09/18/2016 Document Revised: 04/24/2017 Document Reviewed: 09/18/2016 Elsevier Patient Education  2020 [employer].

## 2019-04-18 NOTE — Progress Notes (Signed)
Cardiology Clinic Note   Patient Name: Derrick Compton Date of Encounter: 04/20/2019  Primary Care Provider:  Patient, No Pcp Per Primary Cardiologist:  Derrick Burow, MD  Patient Profile    Derrick Compton 84 year old male presents today for an evaluation of his shortness of breath.  Past Medical History    Past Medical History:  Diagnosis Date  . Anxiety   . BPH (benign prostatic hyperplasia)   . Chest pain   . Hyperlipidemia   . Osteopenia    Past Surgical History:  Procedure Laterality Date  . LEFT HEART CATH AND CORONARY ANGIOGRAPHY N/A 02/05/2017   Procedure: LEFT HEART CATH AND CORONARY ANGIOGRAPHY;  Surgeon: Lorretta Harp, MD;  Location: [facility];  Service: Cardiovascular;  Laterality: N/A;    Allergies  No Known Allergies  History of Present Illness    [name] has a past medical history of coronary artery disease with cardiac catheterization on 02/05/2017 that showed 90% ostial first diagonal branch stenosis coming off  orthogonal to the LAD making PCI high risk.  His cardiac catheterization showed no other significant CAD and medical management was chosen as the best course of therapy.  He had a stress test on 10/2016 which was negative however, he did have a positive CT FFR of a diagonal branch 12/2016 due to his hypotension and bradycardia he was not a good candidate for anti-anginal medications.  An echocardiogram on 5/29 /2020 showed an EF of 55 to 60% and mild stable aortic valve stenosis with a peak gradient of 15 mmHg.  He also has hyperlipidemia which is managed by his PCP.  He had pneumonia symptoms January 2020, was given antibiotics and his follow-up film indicated pulmonary infiltrates in the right upper lobe.  Seen by Dr. Chase Compton on 03/14/2019.  He reviewed a chest CT from 02/23/2019 that was taken at the [facility] clinic in [place] which showed small infiltrates.  He plans to repeat CT chest without contrast at the end of December 2020.   He was last seen by Dr. Gwenlyn Compton on 09/22/2018.  During that time he was doing well.  He denied chest pain and continued to walk 5 to 6 miles daily.  He presented to the clinic on 04/06/2019 and stated he had chest discomfort when he  ran or ran upstairs.  He continued to walk 5 to 6 miles per day with no chest discomfort.  He stated that over the last 6 months he had noticed with more depression.  He had been working with a psychiatrist that is prescribing sertraline.   I  started Ranexa 500 mg twice daily.  He presents to the clinic today and states he has continued to use his elliptical for 30 to 45 minutes at a time and walk up the stairs at the [employer] building.  He states that he continues to have exertional chest pain.  He states that yesterday while he was on the elliptical he had chest discomfort for about 5 minutes that was mild in nature.  He continued to exercise and chest discomfort dissipated.  He went on to state that as soon as he starts to increase his pace or jog for example his chest discomfort reappears.  He states that since he was at the last visit his breathing has been better.  He presented to his primary for a follow-up chest x-ray which showed his pneumonia had cleared.  He went on to state that he has started to use this  mindfulness meditation information/handout that was given to him during the last visit and his anxiety has lessened.  I will increase his Ranexa to 1000 mg twice daily and schedule him for a follow-up appointment in 1 month.  I will order a BMP today as well.  He denies chest pain at rest, shortness of breath, lower extremity edema, fatigue, palpitations, melena, hematuria, hemoptysis, diaphoresis, weakness, presyncope, syncope, orthopnea, and PND.   Home Medications    Prior to Admission medications   Medication Sig Start Date End Date Taking? Authorizing Provider  ALPRAZolam Duanne Moron) 0.5 MG tablet Take 0.5 mg by mouth at bedtime as needed.  03/01/14    [provider]  aspirin EC 81 MG tablet Take 81 mg by mouth daily.     [provider]  Cholecalciferol (VITAMIN D3) 25 MCG (1000 UT) CAPS Vitamin D3 25 mcg (1,000 unit) capsule  Take 1 capsule every day by oral route.    [provider]  mirtazapine (REMERON) 30 MG tablet TAKE 1 TABLET BY MOUTH AT BEDTIME 03/20/19   Cottle, Billey Co., MD  ranolazine (RANEXA) 500 MG 12 hr tablet Take 1 tablet (500 mg total) by mouth 2 (two) times daily. 04/06/19 05/06/19  Deberah Pelton, NP  rosuvastatin (CRESTOR) 40 MG tablet Take 0.5 tablets by mouth daily. 09/22/16   [provider]  sertraline (ZOLOFT) 50 MG tablet Take 3 tablets (150 mg total) by mouth daily. Patient taking differently: Take 100 mg by mouth daily.  03/17/19   Cottle, Billey Co., MD    Family History    No family history on file. He indicated that his mother is deceased. He indicated that his father is deceased.  Social History    Social History   Socioeconomic History  . Marital status: Married    Spouse name: Not on file  . Number of children: Not on file  . Years of education: Not on file  . Highest education level: Not on file  Occupational History  . Not on file  Social Needs  . Financial resource strain: Not on file  . Food insecurity    Worry: Not on file    Inability: Not on file  . Transportation needs    Medical: Not on file    Non-medical: Not on file  Tobacco Use  . Smoking status: Never Smoker  . Smokeless tobacco: Never Used  Substance and Sexual Activity  . Alcohol use: No  . Drug use: No  . Sexual activity: Not on file  Lifestyle  . Physical activity    Days per week: Not on file    Minutes per session: Not on file  . Stress: Not on file  Relationships  . Social Herbalist on phone: Not on file    Gets together: Not on file    Attends religious service: Not on file    Active member of club or organization: Not on file    Attends meetings of  clubs or organizations: Not on file    Relationship status: Not on file  . Intimate partner violence    Fear of current or ex partner: Not on file    Emotionally abused: Not on file    Physically abused: Not on file    Forced sexual activity: Not on file  Other Topics Concern  . Not on file  Social History Narrative  . Not on file     Review of Systems    General:  No chills, fever, night sweats or weight changes.  Cardiovascular:  No chest pain, dyspnea on exertion, edema, orthopnea, palpitations, paroxysmal nocturnal dyspnea. Dermatological: No rash, lesions/masses Respiratory: No cough, dyspnea Urologic: No hematuria, dysuria Abdominal:   No nausea, vomiting, diarrhea, bright red blood per rectum, melena, or hematemesis Neurologic:  No visual changes, wkns, changes in mental status. All other systems reviewed and are otherwise negative except as noted above.  Physical Exam    VS:  BP 102/68   Temp 98.2 F (36.8 C)   Ht 5\' 10"  (1.778 m)   Wt 154 lb 9.6 oz (70.1 kg)   SpO2 97%   BMI 22.18 kg/m  , BMI Body mass index is 22.18 kg/m. GEN: Well nourished, well developed, in no acute distress. HEENT: normal. Neck: Supple, no JVD, carotid bruits, or masses. Cardiac: RRR, no murmurs, rubs, or gallops. No clubbing, cyanosis, edema.  Radials/DP/PT 2+ and equal bilaterally.  Respiratory:  Respirations regular and unlabored, clear to auscultation bilaterally. GI: Soft, nontender, nondistended, BS + x 4. MS: no deformity or atrophy. Skin: warm and dry, no rash. Neuro:  Strength and sensation are intact. Psych: Normal affect.  Accessory Clinical Findings    ECG personally reviewed by me today-sinus bradycardia with sinus arrhythmia incomplete right bundle branch block left anterior fascicular block anterior septal infarct undetermined age 76 bpm.  QTC stable at 387  EKG 04/06/2019 sinus bradycardia premature atrial complexes 52 bpm-QTC 373  EKG 08/19/2017 Sinus bradycardia  with premature atrial complexes, incomplete right bundle branch block, left anterior fascicular block 52 bpm  Echocardiogram 10/22/2018 IMPRESSIONS   1. The left ventricle has normal systolic function, with an ejection fraction of 55-60%. The cavity size was normal. Left ventricular diastolic Doppler parameters are consistent with impaired relaxation. Indeterminate filling pressures. 2. The right ventricle has normal systolic function. The cavity was normal. There is no increase in right ventricular wall thickness. Right ventricular systolic pressure could not be assessed. 3. The mitral valve is abnormal. Mild thickening of the mitral valve leaflet. There is mild mitral annular calcification present. 4. The aortic valve is abnormal. Mild calcification of the aortic valve. Aortic valve regurgitation is trivial by color flow Doppler. Mild stenosis of the aortic valve. Maximum velocity obtained in the apical view. 5. AV Mean Sys Grad:9.0 mmHg. 6. There is mild dilatation of the ascending aorta measuring 41 mm.  Assessment & Plan   1.  Coronary artery disease-no chest pain today.  Continues to have chest pain with exertion.  Cardiac catheterization on 02/05/2017 that showed 90% ostial first diagonal branch stenosis coming off  orthogonal to the LAD making PCI high risk.  His cardiac catheterization showed no other significant CAD and medical management was chosen as the best course of therapy.  Continue aspirin 81 mg tablet daily Continue rosuvastatin 20 mg tablet daily Increase Ranexa to 1000 mg twice daily-QTC stable at 387 Continue stress reduction techniques-information given, follow-up/find  CBT counselor. Order BMP   Hyperlipidemia-LDL 113 02/04/2011 Continue rosuvastatin 20 mg tablet daily Followed by PCP  Aortic stenosis-echocardiogram on 12/09/2016 showed mild aortic stenosis with a peak gradient of 21 mmHg.  His repeat echocardiogram on 10/22/2018 showed an EF of 55 to 60% with  stable aortic valve stenosis and a peak gradient of 15 mmHg  Shortness of breath- breathing is much better today. He had pneumonia symptoms January 2020, was given antibiotics and his follow-up film indicated pulmonary infiltrates in the right upper lobe.  Seen by Dr. Chase Compton on  03/14/2019.  He reviewed a chest CT from 02/23/2019 that was taken at the [facility] clinic in [place] which showed small infiltrates.  He plans to repeat CT chest without contrast at the end of December 2020. Follow-up chest x-ray was negative. Followed by pulmonology   Disposition: Follow-up with me in 1 month.  Jossie Ng. [facility] [place] [address] Office [phone] Fax [phone]

## 2019-04-20 ENCOUNTER — Encounter: Payer: Self-pay | Admitting: General Practice

## 2019-04-20 ENCOUNTER — Other Ambulatory Visit: Payer: Self-pay

## 2019-04-20 ENCOUNTER — Ambulatory Visit (INDEPENDENT_AMBULATORY_CARE_PROVIDER_SITE_OTHER): Payer: Medicare Other | Admitting: General Practice

## 2019-04-20 VITALS — BP 102/68 | Temp 98.2°F | Ht 70.0 in | Wt 154.6 lb

## 2019-04-20 DIAGNOSIS — Z79899 Other long term (current) drug therapy: Secondary | ICD-10-CM

## 2019-04-20 DIAGNOSIS — E782 Mixed hyperlipidemia: Secondary | ICD-10-CM | POA: Diagnosis not present

## 2019-04-20 DIAGNOSIS — R0602 Shortness of breath: Secondary | ICD-10-CM

## 2019-04-20 DIAGNOSIS — I2510 Atherosclerotic heart disease of native coronary artery without angina pectoris: Secondary | ICD-10-CM

## 2019-04-20 DIAGNOSIS — I350 Nonrheumatic aortic (valve) stenosis: Secondary | ICD-10-CM

## 2019-04-20 LAB — BASIC METABOLIC PANEL
BUN/Creatinine Ratio: 24 (ref 10–24)
BUN: 21 mg/dL (ref 8–27)
CO2: 25 mmol/L (ref 20–29)
Calcium: 9.3 mg/dL (ref 8.6–10.2)
Chloride: 101 mmol/L (ref 96–106)
Creatinine, Ser: 0.86 mg/dL (ref 0.76–1.27)
GFR calc Af Amer: 93 mL/min/{1.73_m2} (ref >59)
GFR calc non Af Amer: 81 mL/min/{1.73_m2} (ref >59)
Glucose: 101 mg/dL — ABNORMAL HIGH (ref 65–99)
Potassium: 4.3 mmol/L (ref 3.5–5.2)
Sodium: 141 mmol/L (ref 134–144)

## 2019-04-20 MED ORDER — RANOLAZINE ER 1000 MG PO TB12
1000.0000 mg | ORAL_TABLET | Freq: Two times a day (BID) | ORAL | 0 refills | Status: DC
Start: 2019-04-20 — End: 2019-05-12

## 2019-04-20 NOTE — Patient Instructions (Signed)
Medication Instructions:   INCREASE Ranexa to 1000 mg 2 times a day *If you need a refill on your cardiac medications before your next appointment, please call your pharmacy*  Lab Work: You will need to have labs (blood work) drawn today:  BMET If you have labs (blood work) drawn today and your tests are completely normal, you will receive your results only by: [name] MyChart Message (if you have MyChart) OR . A paper copy in the mail If you have any lab test that is abnormal or we need to change your treatment, we will call you to review the results.  Testing/Procedures:  NONE ordered at this time of appointment   Follow-Up: At [facility], you and your health needs are our priority.  As part of our continuing mission to provide you with exceptional heart care, we have created designated Provider Care Teams.  These Care Teams include your primary Cardiologist (physician) and Advanced Practice Providers (APPs -  Physician Assistants and Nurse Practitioners) who all work together to provide you with the care you need, when you need it.  Your next appointment:   1 month(s)  The format for your next appointment:   Virtual Visit   Provider:   Coletta Memos, FNP  Other Instructions

## 2019-04-26 ENCOUNTER — Other Ambulatory Visit: Payer: Self-pay | Admitting: Psychiatry

## 2019-04-26 DIAGNOSIS — F331 Major depressive disorder, recurrent, moderate: Secondary | ICD-10-CM

## 2019-04-30 ENCOUNTER — Other Ambulatory Visit: Payer: Self-pay | Admitting: General Practice

## 2019-05-05 ENCOUNTER — Telehealth: Payer: Self-pay | Admitting: Cardiovascular Disease

## 2019-05-05 DIAGNOSIS — R002 Palpitations: Secondary | ICD-10-CM

## 2019-05-05 NOTE — Telephone Encounter (Signed)
Pt calling leaving a message asking for a call back to schedule an appt for heart monitor because pt states that his heart rate has been increasing and needed someone to call him back ASAP. Please address

## 2019-05-05 NOTE — Telephone Encounter (Signed)
Spoke with Derrick Compton and for a couple of weeks Derrick Compton has noted on watch elevated heart rate ranging 179-200 and this occurs 3-4 times a day Derrick Compton also continues to c/o chest pain with exercise not sure if this is related Derrick Compton was requesting to monitor Will forward to Dr Gwenlyn Found for review and recommendations ./cy

## 2019-05-06 NOTE — Telephone Encounter (Signed)
Order a 2 week Zio patch then ROV with me for F/U

## 2019-05-09 ENCOUNTER — Telehealth: Payer: Self-pay | Admitting: *Deleted

## 2019-05-09 NOTE — Telephone Encounter (Signed)
Pt aware of recommendations and agrees with plan ./cy 

## 2019-05-09 NOTE — Telephone Encounter (Signed)
14 day ZIO XT to be mailed to the patients home. Instructions included in the monitor kit.

## 2019-05-12 ENCOUNTER — Other Ambulatory Visit: Payer: Self-pay | Admitting: General Practice

## 2019-05-12 DIAGNOSIS — I2510 Atherosclerotic heart disease of native coronary artery without angina pectoris: Secondary | ICD-10-CM

## 2019-05-13 ENCOUNTER — Ambulatory Visit
Admission: RE | Admit: 2019-05-13 | Discharge: 2019-05-13 | Disposition: A | Discharge status: Home / Self Care | Payer: Medicare Other | Source: Ambulatory Visit | Attending: Internal Medicine | Admitting: Internal Medicine

## 2019-05-13 DIAGNOSIS — R911 Solitary pulmonary nodule: Secondary | ICD-10-CM

## 2019-05-16 ENCOUNTER — Ambulatory Visit (INDEPENDENT_AMBULATORY_CARE_PROVIDER_SITE_OTHER): Payer: Medicare Other | Admitting: Psychiatry

## 2019-05-16 ENCOUNTER — Telehealth: Payer: Self-pay | Admitting: Internal Medicine

## 2019-05-16 ENCOUNTER — Other Ambulatory Visit: Payer: Self-pay

## 2019-05-16 ENCOUNTER — Encounter: Payer: Self-pay | Admitting: Psychiatry

## 2019-05-16 DIAGNOSIS — F4001 Agoraphobia with panic disorder: Secondary | ICD-10-CM | POA: Diagnosis not present

## 2019-05-16 DIAGNOSIS — I2510 Atherosclerotic heart disease of native coronary artery without angina pectoris: Secondary | ICD-10-CM

## 2019-05-16 DIAGNOSIS — F331 Major depressive disorder, recurrent, moderate: Secondary | ICD-10-CM | POA: Diagnosis not present

## 2019-05-16 DIAGNOSIS — F5105 Insomnia due to other mental disorder: Secondary | ICD-10-CM

## 2019-05-16 DIAGNOSIS — F411 Generalized anxiety disorder: Secondary | ICD-10-CM

## 2019-05-16 NOTE — Telephone Encounter (Signed)
Called and spoke with Patient.  Patient was wanting to know if his recent CT results.  At this time, CT chest results are not resulted by Dr. Chase Caller.  Explained to Patient once results are resulted, someone will call him with results.  Patient stated understanding.  Nothing further at this time.

## 2019-05-16 NOTE — Progress Notes (Signed)
Derrick Compton:[id number] [birth year] 84 y.o.  Subjective:   Patient ID:  Derrick Compton is a 84 y.o. (DOB 01-Apr-1935) male.  Chief Complaint:  Chief Complaint  Patient presents with  . Depression  . Anxiety    Depression        Associated symptoms include no decreased concentration and no suicidal ideas.  Derrick Compton presents to the office today for follow-up of TRD  And panic and GAD.   At  visit in December he was depressed.  Started mirtazapine and very low dose lithium.  Awakens with a little depression.  Sleep is pretty good and rarely needs bz.  Depression better after a week or so.  Tolerating meds ok.  Not gaining weight like when on mirtazapine before.   When seen July 28, 2018.  No meds were changed. However at some point he discontinued lithium 150 mg daily.  He stopped bc didn't seem to be doing anything and read about all the side effects.  When last seen October 2020 the following observations were made: He has relapsed again and the only change is he stopped lithium.  He doesn't think it helped but I question that. He prefers to increase sertraline over restarting lithium.  Tried to address his fears about lithium. Option increase mirtazapine but may lose sleep benefit. Increase sertraline to 50 mg tabs 3 daily for total of 150 mg daily.  More  Groggy with increase sertraline and reduced it back to the original 50 mg daily for about 6 weeks. Went into Mind Stress Management and meditation and it seems to help a good bit.  Thinks a lot of his depression is anxiety over his health fears and the meditation helps to deal with it.  covid didn't help his mood.  Worries over his heart bc he has some issues with it.  Can enjoy watching a ball game and function is better.  Doesn't have to stay so busy to be OK now.  Recognizes he needs to change the way he thinks to live with more peace.  Rare alprazolam about 2 times monthly.  Business doing well.  Notices calming  effect from sertraline.  Fewer speeding tickets. Patient reports stable mood and denies depressed or irritable moods.  Less difficulty with anxiety.  Patient denies difficulty with sleep initiation or maintenance. NO appetite disturbance.  Patient reports that energy and motivation have been good.  Patient denies any difficulty with concentration.  Patient denies any suicidal ideation.  Past psychiatric medications Wellbutrin 150, citalopram, paroxetine, Deplin, lorazepam, fluoxetine side effects, buspirone, Abilify 2 mg, Rexulti 2 mg, sertraline 150  groggy  Review of Systems:  Review of Systems  Constitutional: Negative for activity change and unexpected weight change.  Neurological: Negative for tremors and weakness.  Psychiatric/Behavioral: Positive for depression. Negative for agitation, behavioral problems, confusion, decreased concentration, dysphoric mood, hallucinations, self-injury, sleep disturbance and suicidal ideas. The patient is not nervous/anxious and is not hyperactive.     Medications: I have reviewed the patient's current medications.  Current Outpatient Medications  Medication Sig Dispense Refill  . ALPRAZolam (XANAX) 0.5 MG tablet Take 0.5 mg by mouth at bedtime as needed.     [name] aspirin EC 81 MG tablet Take 81 mg by mouth daily.     . Cholecalciferol (VITAMIN D3) 25 MCG (1000 UT) CAPS Vitamin D3 25 mcg (1,000 unit) capsule  Take 1 capsule every day by oral route.    . mirtazapine (REMERON) 30 MG tablet TAKE 1  TABLET BY MOUTH AT BEDTIME 30 tablet 5  . ranolazine (RANEXA) 1000 MG SR tablet TAKE 1 TABLET BY MOUTH TWICE A DAY 60 tablet 0  . rosuvastatin (CRESTOR) 40 MG tablet Take 0.5 tablets by mouth daily.  3  . sertraline (ZOLOFT) 50 MG tablet TAKE 3 TABLETS BY MOUTH EVERY DAY (Patient taking differently: Take 50 mg by mouth daily. ) 270 tablet 1   No current facility-administered medications for this visit.    Medication Side Effects: None  Allergies: No Known  Allergies  Past Medical History:  Diagnosis Date  . Anxiety   . BPH (benign prostatic hyperplasia)   . Chest pain   . Hyperlipidemia   . Osteopenia     History reviewed. No pertinent family history.  Social History   Socioeconomic History  . Marital status: Married    Spouse name: Not on file  . Number of children: Not on file  . Years of education: Not on file  . Highest education level: Not on file  Occupational History  . Not on file  Tobacco Use  . Smoking status: Never Smoker  . Smokeless tobacco: Never Used  Substance and Sexual Activity  . Alcohol use: No  . Drug use: No  . Sexual activity: Not on file  Other Topics Concern  . Not on file  Social History Narrative  . Not on file   Social Determinants of Health   Financial Resource Strain:   . Difficulty of Paying Living Expenses: Not on file  Food Insecurity:   . Worried About Charity fundraiser in the Last Year: Not on file  . Ran Out of Food in the Last Year: Not on file  Transportation Needs:   . Lack of Transportation (Medical): Not on file  . Lack of Transportation (Non-Medical): Not on file  Physical Activity:   . Days of Exercise per Week: Not on file  . Minutes of Exercise per Session: Not on file  Stress:   . Feeling of Stress : Not on file  Social Connections:   . Frequency of Communication with Friends and Family: Not on file  . Frequency of Social Gatherings with Friends and Family: Not on file  . Attends Religious Services: Not on file  . Active Member of Clubs or Organizations: Not on file  . Attends Archivist Meetings: Not on file  . Marital Status: Not on file  Intimate Partner Violence:   . Fear of Current or Ex-Partner: Not on file  . Emotionally Abused: Not on file  . Physically Abused: Not on file  . Sexually Abused: Not on file    Past Medical History, Surgical history, Social history, and Family history were reviewed and updated as appropriate.   Please see  review of systems for further details on the patient's review from today.   Objective:   Physical Exam:  There were no vitals taken for this visit.  Physical Exam Constitutional:      General: He is not in acute distress.    Appearance: He is well-developed.  Musculoskeletal:        General: No deformity.  Neurological:     Mental Status: He is alert and oriented to person, place, and time.     Motor: No tremor.     Coordination: Coordination normal.     Gait: Gait normal.  Psychiatric:        Attention and Perception: He is attentive. He does not perceive auditory hallucinations.  Mood and Affect: Mood is not anxious or depressed. Affect is not labile, blunt, angry or inappropriate.        Speech: Speech normal.        Behavior: Behavior normal.        Thought Content: Thought content normal. Thought content does not include homicidal or suicidal ideation. Thought content does not include homicidal or suicidal plan.        Cognition and Memory: Cognition normal.        Judgment: Judgment normal.     Comments: Insight and judgment fair. No auditory or visual hallucinations. No delusions.  Ruminating stopped.     Lab Review:     Component Value Date/Time   NA 141 04/20/2019 1034   K 4.3 04/20/2019 1034   CL 101 04/20/2019 1034   CO2 25 04/20/2019 1034   GLUCOSE 101 (H) 04/20/2019 1034   GLUCOSE 108 (H) 02/11/2011 0533   BUN 21 04/20/2019 1034   CREATININE 0.86 04/20/2019 1034   CALCIUM 9.3 04/20/2019 1034   PROT 6.9 02/04/2011 0840   ALBUMIN 3.8 02/04/2011 0840   AST 18 02/04/2011 0840   ALT 18 02/04/2011 0840   ALKPHOS 75 02/04/2011 0840   BILITOT 0.6 02/04/2011 0840   GFRNONAA 81 04/20/2019 1034   GFRAA 93 04/20/2019 1034       Component Value Date/Time   WBC 5.7 01/27/2017 0843   WBC 5.6 02/09/2011 0510   RBC 4.69 01/27/2017 0843   RBC 4.34 02/09/2011 0510   HGB 14.7 01/27/2017 0843   HCT 43.4 01/27/2017 0843   PLT 205 01/27/2017 0843   MCV 93  01/27/2017 0843   MCH 31.3 01/27/2017 0843   MCH 31.3 02/09/2011 0510   MCHC 33.9 01/27/2017 0843   MCHC 35.1 02/09/2011 0510   RDW 13.3 01/27/2017 0843   LYMPHSABS 1.6 01/27/2017 0843   MONOABS 0.8 02/05/2011 0340   EOSABS 0.2 01/27/2017 0843   BASOSABS 0.0 01/27/2017 0843    No results found for: POCLITH, LITHIUM   No results found for: PHENYTOIN, PHENOBARB, VALPROATE, CBMZ   .res Assessment: Plan:    Major depressive disorder, recurrent episode, moderate (HCC)  Generalized anxiety disorder  Panic disorder with agoraphobia  Insomnia due to mental condition   Hx multiple recurrences of depression some of which were due to to noncompliance with medication..  Many of the recurrences have been due to noncompliance for various reasons.  He is medication sensitive which complicates things.    He has relapsed again and the only change is he stopped lithium.  He doesn't think it helped but I question that. He feels that this therapy he's doing is done on his own and it's helped his depression and anxiety  He agrees to continue the current medication without making changes on his own.  Discussed the risk of relapse if he does. Do not change AMA. Continue sertraline 50 Continue mirtazapine 30 HS.  FU 3-4 mos  Lynder Parents, MD, DFAPA   Please see After Visit Summary for patient specific instructions.  Future Appointments  Date Time Provider Roland  05/31/2019  9:30 AM Deberah Pelton, NP [facility]  08/16/2019  4:30 PM Cottle, Billey Co., MD [facility] None    No orders of the defined types were placed in this encounter.     -------------------------------

## 2019-05-18 ENCOUNTER — Ambulatory Visit (INDEPENDENT_AMBULATORY_CARE_PROVIDER_SITE_OTHER): Payer: Medicare Other

## 2019-05-18 ENCOUNTER — Telehealth: Payer: Self-pay | Admitting: Internal Medicine

## 2019-05-18 DIAGNOSIS — R000 Tachycardia, unspecified: Secondary | ICD-10-CM

## 2019-05-18 DIAGNOSIS — R002 Palpitations: Secondary | ICD-10-CM | POA: Diagnosis not present

## 2019-05-18 DIAGNOSIS — R918 Other nonspecific abnormal finding of lung field: Secondary | ICD-10-CM

## 2019-05-18 NOTE — Telephone Encounter (Signed)
Spoke with patient. Reviewed results with him. He verbalized understanding. He stated that he did have PNA back in January 2020. Will go ahead and place order for follow up CT 12 months from last CT.   Nothing further needed at time of call.

## 2019-05-18 NOTE — Telephone Encounter (Signed)
Pt had CT performed 12/18 and is calling requesting to know the results. Beth,is there any way you can look at this?

## 2019-05-18 NOTE — Telephone Encounter (Signed)
No acute findings. Interval resolution of patchy ground glass in right lower lobe consistent with prior inflammatory or infection process. Stable nodular opacity right upper lobe suggesting chronic post inflammatory scarring. Recommend CT chest in 12 months (please order under MR)

## 2019-05-29 ENCOUNTER — Other Ambulatory Visit: Payer: Self-pay | Admitting: General Practice

## 2019-05-29 DIAGNOSIS — I2510 Atherosclerotic heart disease of native coronary artery without angina pectoris: Secondary | ICD-10-CM

## 2019-05-31 ENCOUNTER — Telehealth: Payer: Medicare Other | Admitting: General Practice

## 2019-06-14 ENCOUNTER — Other Ambulatory Visit: Payer: Self-pay | Admitting: Psychiatry

## 2019-06-14 ENCOUNTER — Other Ambulatory Visit: Payer: Self-pay | Admitting: Cardiovascular Disease

## 2019-06-14 DIAGNOSIS — R002 Palpitations: Secondary | ICD-10-CM

## 2019-06-14 DIAGNOSIS — R000 Tachycardia, unspecified: Secondary | ICD-10-CM

## 2019-06-14 NOTE — Progress Notes (Deleted)
{Choose 1 Note Type (Telehealth Visit or Telephone Visit):[phone]}  Evaluation Performed:  Follow-up visit  This visit type was conducted due to national recommendations for restrictions regarding the COVID-19 Pandemic (e.g. social distancing).  This format is felt to be most appropriate for this patient at this time.  All issues noted in this document were discussed and addressed.  No physical exam was performed (except for noted visual exam findings with Video Visits).  Please refer to the patient's chart (MyChart message for video visits and phone note for telephone visits) for the patient's consent to telehealth for [facility]  Date:  06/14/2019   ID:  Derrick Compton, DOB 1934/06/08, MRN [id number]  Patient Location:  [place] [address]   Provider location:     [address] Office [phone] Fax [phone]   PCP:  Patient, No Pcp Per  Cardiologist:  Derrick Burow, MD  Electrophysiologist:  None   Chief Complaint: Follow-up  History of Present Illness:    Derrick Compton is a 85 y.o. male who presents via audio/video conferencing for a telehealth visit today.  Patient verified DOB and address.  The patient {does/does not:200015} symptoms concerning for COVID-19 infection (fever, chills, cough, or new SHORTNESS OF BREATH).   [name] a past medical history of coronary artery disease with cardiac catheterization on 02/05/2017 that showed 90% ostial first diagonal branch stenosis coming off orthogonal to the LAD making PCI high risk. His cardiac catheterization showed no other significant CAD andmedical management was chosen as the best course of therapy. He had a stress test on 10/2016 which was negative however, he did have a positive CT FFR of a diagonal branch 12/2016 due to his hypotension and bradycardia he was not a good candidate for anti-anginal medications. An echocardiogram on  5/29 /2020 showed an EF of 55 to 60% and mild stable aortic valve stenosis with a peak gradient of 15 mmHg. He also has hyperlipidemia which is managed by his PCP.  He had pneumonia symptoms January 2020, was given antibiotics and his follow-up film indicated pulmonary infiltrates in the right upper lobe. Seen by Dr. Chase Caller on 03/14/2019. He reviewed a chest CT from 02/23/2019 that was taken at the [facility] clinic in [place] which showed small infiltrates. He plans to repeat CT chest without contrast at the end of December 2020.  He was last seen by Dr. Gwenlyn Found on 09/22/2018. During that time he was doing well. He denied chest pain and continued to walk 5 to 6 miles daily.  He presented to the clinic on 04/06/2019 and stated he hadchest discomfort when he  ran or ran upstairs. He continued to walk 5 to 6 miles per day with no chest discomfort. He stated that over the last 6 months he had noticed with more depression. He had been working with a psychiatrist that is prescribing sertraline.  I  started Ranexa 500 mg twice daily.  He presented to the clinic 03/2019 and stated he had continued to use his elliptical for 30 to 45 minutes at a time and walk up the stairs at the [facility] building.  He stated that he continued to have exertional chest pain.  He stated that yesterday 04/19/19 while he was on the elliptical he had chest discomfort for about 5 minutes that was mild in nature.  He continued to exercise and chest discomfort dissipated.  He went on to state that as soon as  he started to increase his pace or jog for example, his chest discomfort reappeared.  He stated that since he was at the last visit his breathing had been better.  He presented to his primary for a follow-up chest x-ray which showed his pneumonia had cleared.  He went on to state that he had started to use this mindfulness meditation information/handout that was given to him during the last visit and his anxiety has  lessened.  I will increased his Ranexa to 1000 mg twice daily and schedule him for a follow-up appointment in 1 month. His BMP at that time was WDL.   On 05/05/2019 he contacted the nurse triage line and indicated that he had increased heart rates in the 179-200 bpm range and was occurring 3-4 times a day.  This is been ongoing for 2 weeks.  A 14-day Zio patch was ordered which showed a 2% AF burden and 45 episodes of SVT.  He is seen virtually today and states***  He denies chest pain at rest, shortness of breath, lower extremity edema, fatigue, palpitations, melena, hematuria, hemoptysis, diaphoresis, weakness, presyncope, syncope, orthopnea, and PND.  Prior CV studies:   The following studies were reviewed today:  EKG 04/20/2019 sinus bradycardia with sinus arrhythmia incomplete right bundle branch block left anterior fascicular block anterior septal infarct undetermined age 74 bpm.  QTC stable at 387  EKG 04/06/2019 sinus bradycardia premature atrial complexes 52 bpm-QTC 373  EKG 08/19/2017 Sinus bradycardia with premature atrial complexes, incomplete right bundle branch block, left anterior fascicular block 52 bpm  Echocardiogram 10/22/2018 IMPRESSIONS   1. The left ventricle has normal systolic function, with an ejection fraction of 55-60%. The cavity size was normal. Left ventricular diastolic Doppler parameters are consistent with impaired relaxation. Indeterminate filling pressures. 2. The right ventricle has normal systolic function. The cavity was normal. There is no increase in right ventricular wall thickness. Right ventricular systolic pressure could not be assessed. 3. The mitral valve is abnormal. Mild thickening of the mitral valve leaflet. There is mild mitral annular calcification present. 4. The aortic valve is abnormal. Mild calcification of the aortic valve. Aortic valve regurgitation is trivial by color flow Doppler. Mild stenosis of the aortic valve. Maximum  velocity obtained in the apical view. 5. AV Mean Sys Grad:9.0 mmHg. 6. There is mild dilatation of the ascending aorta measuring 41 mm.  05/13/2019 CT chest without contrast IMPRESSION: No acute findings.  Interval resolution of patchy ground-glass and airspace opacity in the superior right lower lobe, consistent with resolving inflammatory or infectious etiology.  Stable reticulonodular and ground-glass opacity in the posterior right upper lobe with associated mild bronchiectasis, suggesting chronic postinflammatory scarring. Recommend continued follow-up by chest CT in 12 months to confirm continued stability.  Past Medical History:  Diagnosis Date  . Anxiety   . BPH (benign prostatic hyperplasia)   . Chest pain   . Hyperlipidemia   . Osteopenia    Past Surgical History:  Procedure Laterality Date  . LEFT HEART CATH AND CORONARY ANGIOGRAPHY N/A 02/05/2017   Procedure: LEFT HEART CATH AND CORONARY ANGIOGRAPHY;  Surgeon: Lorretta Harp, MD;  Location: [facility];  Service: Cardiovascular;  Laterality: N/A;     No outpatient medications have been marked as taking for the 06/15/19 encounter (Appointment) with Deberah Pelton, NP.     Allergies:   Patient has no known allergies.   Social History   Tobacco Use  . Smoking status: Never Smoker  . Smokeless  tobacco: Never Used  Substance Use Topics  . Alcohol use: No  . Drug use: No     Family Hx: The patient's family history is not on file.  ROS:   Please see the history of present illness.     All other systems reviewed and are negative.   Labs/Other Tests and Data Reviewed:    Recent Labs: 04/20/2019: BUN 21; Creatinine, Ser 0.86; Potassium 4.3; Sodium 141   Recent Lipid Panel Lab Results  Component Value Date/Time   CHOL 196 02/04/2011 03:09 PM   TRIG 99 02/04/2011 03:09 PM   HDL 63 02/04/2011 03:09 PM   CHOLHDL 3.1 02/04/2011 03:09 PM   LDLCALC 113 (H) 02/04/2011 03:09 PM    Wt  Readings from Last 3 Encounters:  04/20/19 154 lb 9.6 oz (70.1 kg)  04/06/19 156 lb 6.4 oz (70.9 kg)  03/14/19 155 lb 9.6 oz (70.6 kg)     Exam:    Vital Signs:  There were no vitals taken for this visit.   Well nourished, well developed male in no  acute distress.   ASSESSMENT & PLAN:    1.  SVT/AF-pulse today***bpm.  14-day ZIOresult showed 2% AF burden and 45 episodes of SVT.  History of sinus bradycardia in 40s-50s not a candidate for beta blocking medications.This patients CHA2DS2-VASc Score and unadjusted Ischemic Stroke Rate (% per year) is equal to 3.2 % stroke rate/year from a score of 3(Age, Vascular) Case discuss with Dr. Gwenlyn Found  Consult EP for further evaluation of SVT/AF and initiation of anticoagulation therapy.  Avoid triggers caffeine, chocolate, EtOH, etc. Maintain physical activity Order CBC, TSH, magnesium  Coronary artery disease-*** chest pain today.  Continues to have chest pain with exertion.Cardiac catheterization on 02/05/2017 that showed 90% ostial first diagonal branch stenosis coming off orthogonal to the LAD making PCI high risk. His cardiac catheterization showed no other significant CAD and medical management was chosen as the best course of therapy. Continue aspirin 81 mg tablet daily Continue rosuvastatin20 mg tablet daily Increase Ranexa to 1000 mg twice daily-QTC stable at 387 Continue stress reduction techniques-information given, follow-up/findCBT counselor.  Hyperlipidemia-LDL 113 02/04/2011 Continue rosuvastatin20 mg tablet daily Followed by PCP  Aortic stenosis-echocardiogram on 12/09/2016 showed mild aortic stenosis with a peak gradient of 21 mmHg. His repeat echocardiogram on 10/22/2018 showed an EF of 55 to 60% with stable aortic valve stenosis and a peak gradient of 15 mmHg Recommend repeat echocardiogram 09/2019  Shortness of breath-breathing is ***.He had pneumonia symptoms January 2020, was given antibiotics and his follow-up  film indicated pulmonary infiltrates in the right upper lobe. Seen by Dr. Chase Caller on 03/14/2019. He reviewed a chest CT from 02/23/2019 that was taken at the [facility] clinic in [place] which showed small infiltrates. Repeat CT chest 04/2019 showed no acute findings, interval resolution of patchy groundglass in the right lower lobe consistent with prior inflammatory infectious process stable nodular opacity right upper lobe suggesting chronic post inflammatory scarring.  Pulmonary to repeat CT chest annually.. Followed by pulmonology   COVID-19 Education: The signs and symptoms of COVID-19 were discussed with the patient and how to seek care for testing (follow up with PCP or arrange E-visit).  The importance of social distancing was discussed today.  Patient Risk:   After full review of this patients clinical status, I feel that they are at least moderate risk at this time.  Time:   Today, I have spent *** minutes with the patient with telehealth technology discussing ***.  Medication Adjustments/Labs and Tests Ordered: Current medicines are reviewed at length with the patient today.  Concerns regarding medicines are outlined above.   Tests Ordered: No orders of the defined types were placed in this encounter.  Medication Changes: No orders of the defined types were placed in this encounter.   Disposition:  {follow up:15908}  Signed, Jossie Ng. [facility] [address] Office [phone] Fax [phone]

## 2019-06-15 ENCOUNTER — Encounter: Payer: Self-pay | Admitting: Medical

## 2019-06-15 ENCOUNTER — Other Ambulatory Visit: Payer: Self-pay

## 2019-06-15 ENCOUNTER — Ambulatory Visit (INDEPENDENT_AMBULATORY_CARE_PROVIDER_SITE_OTHER): Payer: Medicare Other | Admitting: Medical

## 2019-06-15 ENCOUNTER — Ambulatory Visit: Payer: Medicare Other | Admitting: General Practice

## 2019-06-15 VITALS — BP 112/66 | HR 59 | Ht 70.0 in | Wt 156.0 lb

## 2019-06-15 DIAGNOSIS — E7800 Pure hypercholesterolemia, unspecified: Secondary | ICD-10-CM | POA: Diagnosis not present

## 2019-06-15 DIAGNOSIS — I350 Nonrheumatic aortic (valve) stenosis: Secondary | ICD-10-CM

## 2019-06-15 DIAGNOSIS — I471 Supraventricular tachycardia: Secondary | ICD-10-CM

## 2019-06-15 DIAGNOSIS — I2510 Atherosclerotic heart disease of native coronary artery without angina pectoris: Secondary | ICD-10-CM

## 2019-06-15 DIAGNOSIS — I480 Paroxysmal atrial fibrillation: Secondary | ICD-10-CM

## 2019-06-15 LAB — CBC
Hematocrit: 43.8 % (ref 37.5–51.0)
Hemoglobin: 14.7 g/dL (ref 13.0–17.7)
MCH: 31.1 pg (ref 26.6–33.0)
MCHC: 33.6 g/dL (ref 31.5–35.7)
MCV: 93 fL (ref 79–97)
Platelets: 178 10*3/uL (ref 150–450)
RBC: 4.73 x10E6/uL (ref 4.14–5.80)
RDW: 13.1 % (ref 11.6–15.4)
WBC: 5.8 10*3/uL (ref 3.4–10.8)

## 2019-06-15 LAB — BASIC METABOLIC PANEL
BUN/Creatinine Ratio: 18 (ref 10–24)
BUN: 17 mg/dL (ref 8–27)
CO2: 26 mmol/L (ref 20–29)
Calcium: 10 mg/dL (ref 8.6–10.2)
Chloride: 101 mmol/L (ref 96–106)
Creatinine, Ser: 0.92 mg/dL (ref 0.76–1.27)
GFR calc Af Amer: 89 mL/min/{1.73_m2} (ref >59)
GFR calc non Af Amer: 77 mL/min/{1.73_m2} (ref >59)
Glucose: 101 mg/dL — ABNORMAL HIGH (ref 65–99)
Potassium: 4.5 mmol/L (ref 3.5–5.2)
Sodium: 141 mmol/L (ref 134–144)

## 2019-06-15 LAB — TSH: TSH: 2.2 u[IU]/mL (ref 0.450–4.500)

## 2019-06-15 LAB — MAGNESIUM: Magnesium: 2.3 mg/dL (ref 1.6–2.3)

## 2019-06-15 NOTE — Patient Instructions (Addendum)
Medication Instructions:  Your physician recommends that you continue on your current medications as directed. Please refer to the Current Medication list given to you today.  *If you need a refill on your cardiac medications before your next appointment, please call your pharmacy*  Lab Work: Your physician recommends that you return for lab work TODAY:  BMET  CBC  MAGNESIUM  TSH  If you have labs (blood work) drawn today and your tests are completely normal, you will receive your results only by: [name] MyChart Message (if you have MyChart) OR . A paper copy in the mail If you have any lab test that is abnormal or we need to change your treatment, we will call you to review the results.  Testing/Procedures: You have been referred to [place] 1-2 WEEKS  Follow-Up: At [facility], you and your health needs are our priority.  As part of our continuing mission to provide you with exceptional heart care, we have created designated Provider Care Teams.  These Care Teams include your primary Cardiologist (physician) and Advanced Practice Providers (APPs -  Physician Assistants and Nurse Practitioners) who all work together to provide you with the care you need, when you need it.  Your next appointment:   3 month(s)  The format for your next appointment:   In Person  Provider:   Quay Burow, MD  Other Instructions

## 2019-06-15 NOTE — Progress Notes (Signed)
Cardiology Office Note   Date:  06/15/2019   ID:  Derrick Compton, Derrick Compton 1934/11/19, MRN [id number]  PCP:  Patient, No Pcp Per  Cardiologist:  Quay Burow, MD EP: None  Chief Complaint  Patient presents with  . Follow-up    discuss recent event monitor      History of Present Illness: [name] has a past medical history of coronary artery disease with cardiac catheterization on 02/05/2017 that showed 90% ostial first diagonal branch stenosis coming off orthogonal to the LAD making PCI high risk. His cardiac catheterization showed no other significant CAD and medical management was chosen as the best course of therapy. He had a stress test on 10/2016 which was negative however, he did have a positive CT FFR of a diagonal branch 12/2016 due to his hypotension and bradycardia he was not a good candidate for anti-anginal medications. An echocardiogram on 10/22/2018 showed an EF of 55 to 60% and mild stable aortic valve stenosis with a peak gradient of 15 mmHg. He also has hyperlipidemia which is managed by his PCP.  He was last seen by Dr. Gwenlyn Found on 09/22/2018. During that time he was doing well. He denied chest pain and continued to walk 5 to 6 miles daily.  He presented to the clinic on 04/06/2019 and stated he had chest discomfort when he ran or ran upstairs. He continued to walk 5 to 6 miles per day with no chest discomfort. He stated that over the last 6 months he had noticed with more depression. He had been working with a psychiatrist that is prescribing sertraline. He was started on Ranexa 500 mg twice daily at that time.  He presented to the clinic 03/2019 and stated he had continued to use his elliptical for 30 to 45 minutes at a time and walk up the stairs at the [facility] building. He stated that he continued to have exertional chest pain. He stated that 04/19/19 while he was on the elliptical he had chest discomfort for about 5 minutes that was mild in nature. He continued to  exercise and chest discomfort dissipated. He went on to state that as soon as he started to increase his pace or jog for example, his chest discomfort reappeared. He stated that since he was at the last visit his breathing had been better. He presented to his primary for a follow-up chest x-ray which showed his pneumonia had cleared. He went on to state that he had started to use this mindfulness meditation information/handout that was given to him during the last visit and his anxiety has lessened. I will increased his Ranexa to 1000 mg twice daily and schedule him for a follow-up appointment in 1 month. His BMP at that time was WNL.  On 05/05/2019 he contacted the nurse triage line and indicated that he had increased heart rates in the 179-200 bpm range and was occurring 3-4 times a day. This is been ongoing for 2 weeks. A 14-day Zio patch was ordered which showed a 2% AF burden and 45 episodes of SVT.  He presents today to discuss his recent cardiac monitor results. We discussed the nature of SVT and atrial fibrillation.   He reports oddly enough the episodes of palpitations has improved over the last week, however still occurs daily. He has some SOB which he thinks may be related to the fast heart beats. He has rare chest pain since starting ranexa 1000 mg BID. No complaints of dizziness, lightheadedness, syncope, or  LE edema.    Past Medical History:  Diagnosis Date  . Anxiety   . BPH (benign prostatic hyperplasia)   . Chest pain   . Hyperlipidemia   . Osteopenia     Past Surgical History:  Procedure Laterality Date  . LEFT HEART CATH AND CORONARY ANGIOGRAPHY N/A 02/05/2017   Procedure: LEFT HEART CATH AND CORONARY ANGIOGRAPHY;  Surgeon: Lorretta Harp, MD;  Location: [facility];  Service: Cardiovascular;  Laterality: N/A;     Current Outpatient Medications  Medication Sig Dispense Refill  . ALPRAZolam (XANAX) 0.5 MG tablet Take 0.5 mg by mouth at bedtime as needed.     [name]  aspirin EC 81 MG tablet Take 81 mg by mouth daily.     . Cholecalciferol (VITAMIN D3) 25 MCG (1000 UT) CAPS Vitamin D3 25 mcg (1,000 unit) capsule  Take 1 capsule every day by oral route.    . mirtazapine (REMERON) 30 MG tablet TAKE 1 TABLET BY MOUTH AT BEDTIME 30 tablet 5  . ranolazine (RANEXA) 1000 MG SR tablet TAKE 1 TABLET BY MOUTH TWICE A DAY 180 tablet 1  . rosuvastatin (CRESTOR) 40 MG tablet Take 0.5 tablets by mouth daily.  3  . sertraline (ZOLOFT) 50 MG tablet TAKE 3 TABLETS BY MOUTH EVERY DAY (Patient taking differently: Take 50 mg by mouth daily. ) 270 tablet 1   No current facility-administered medications for this visit.    Allergies:   Patient has no known allergies.    Social History:  The patient  reports that he has never smoked. He has never used smokeless tobacco. He reports that he does not drink alcohol or use drugs.   Family History:  The patient's family history is not on file.    ROS:  Please see the history of present illness.   Otherwise, review of systems are positive for none.   All other systems are reviewed and negative.    PHYSICAL EXAM: VS:  BP 112/66   Pulse (!) 59   Ht 5\' 10"  (1.778 m)   Wt 156 lb (70.8 kg)   BMI 22.38 kg/m  , BMI Body mass index is 22.38 kg/m. GEN: Well nourished, well developed, in no acute distress HEENT: normal Neck: no JVD, carotid bruits, or masses Cardiac: RRR; no murmurs, rubs, or gallops, no edema  Respiratory:  clear to auscultation bilaterally, normal work of breathing GI: soft, nontender, nondistended, + BS MS: no deformity or atrophy Skin: warm and dry, no rash Neuro:  Strength and sensation are intact Psych: euthymic mood, full affect   EKG:  EKG is ordered today. The ekg ordered today demonstrates sinus bradycardia with sinus arrhythmia, rate 59 bpm, chronic LAFB, no STE/D, isolated TWI in aVL (chronic), no significant change from previous.    Recent Labs: 04/20/2019: BUN 21; Creatinine, Ser 0.86;  Potassium 4.3; Sodium 141    Lipid Panel    Component Value Date/Time   CHOL 196 02/04/2011 1509   TRIG 99 02/04/2011 1509   HDL 63 02/04/2011 1509   CHOLHDL 3.1 02/04/2011 1509   VLDL 20 02/04/2011 1509   LDLCALC 113 (H) 02/04/2011 1509      Wt Readings from Last 3 Encounters:  06/15/19 156 lb (70.8 kg)  04/20/19 154 lb 9.6 oz (70.1 kg)  04/06/19 156 lb 6.4 oz (70.9 kg)      Other studies Reviewed: Additional studies/ records that were reviewed today include:   Echocardiogram 10/22/2018  IMPRESSIONS  1. The left ventricle  has normal systolic function, with an ejection fraction of 55-60%. The cavity size was normal. Left ventricular diastolic Doppler parameters are consistent with impaired relaxation. Indeterminate filling pressures.  2. The right ventricle has normal systolic function. The cavity was normal. There is no increase in right ventricular wall thickness. Right ventricular systolic pressure could not be assessed.  3. The mitral valve is abnormal. Mild thickening of the mitral valve leaflet. There is mild mitral annular calcification present.  4. The aortic valve is abnormal. Mild calcification of the aortic valve. Aortic valve regurgitation is trivial by color flow Doppler. Mild stenosis of the aortic valve. Maximum velocity obtained in the apical view.  5. AV Mean Sys Grad:9.0 mmHg.  6. There is mild dilatation of the ascending aorta measuring 41 mm.   ASSESSMENT AND PLAN:  1. New diagnosis of SVT and Atrial fibrillation: 14-day ZIO result showed 2% AF burden and 45 episodes of SVT. History of sinus bradycardia in 40s-50s not a candidate for AV nodal blocking medications.This patients CHA2DS2-VASc Score and unadjusted Ischemic Stroke Rate (% per year) is equal to 3.2 % stroke rate/year from a score of 3 (Age, Vascular). Case discuss with Dr. Gwenlyn Found who recommended deferring anticoagulation recommendations to EP. - Will refer to EP for further evaluation of  SVT/AF and possible ablation vs antiarrhythmic medications - Avoid triggers caffeine, chocolate, EtOH, etc. - Maintain physical activity - Will check CBC, BMET, TSH, magnesium - We discussed vagal maneuvers for management of SVT.   2. Coronary artery disease: rare chest pain complaints recently. Continues to have chest pain with exertion. Cardiac catheterization on 02/05/2017 that showed 90% ostial first diagonal branch stenosis coming off orthogonal to the LAD making PCI high risk; no other significant CAD and medical management was chosen as the best course of therapy. - Continue aspirin 81 mg tablet daily - Continue rosuvastatin 20 mg tablet daily - Continue Ranexa 1000 mg twice daily - QTC stable at 405 - Continue stress reduction techniques -information given, follow-up/find CBT counselor.  3. Hyperlipidemia: no recent lipids. LDL 113 02/04/2011 - Continue rosuvastatin 20 mg tablet daily - Followed by PCP  4. Aortic stenosis: echocardiogram on 12/09/2016 showed mild aortic stenosis with a peak gradient of 21 mmHg. His repeat echocardiogram on 10/22/2018 showed an EF of 55 to 60% with stable aortic valve stenosis and a peak gradient of 15 mmHg - Recommend repeat echocardiogram 09/2019     Current medicines are reviewed at length with the patient today.  The patient does not have concerns regarding medicines.  The following changes have been made:  no change  Labs/ tests ordered today include:   Orders Placed This Encounter  Procedures  . Basic metabolic panel  . Magnesium  . CBC  . TSH  . Ambulatory referral to Cardiac Electrophysiology  . EKG 12-Lead     Disposition:   FU with Dr. Gwenlyn Found in 3 months and EP in the next 1-2 weeks  Signed, Abigail Butts, PA-C  06/15/2019 12:33 PM

## 2019-06-17 ENCOUNTER — Telehealth: Payer: Self-pay

## 2019-06-20 ENCOUNTER — Telehealth: Payer: Self-pay

## 2019-06-20 ENCOUNTER — Telehealth (INDEPENDENT_AMBULATORY_CARE_PROVIDER_SITE_OTHER): Payer: Medicare Other | Admitting: Internal Medicine

## 2019-06-20 ENCOUNTER — Encounter: Payer: Self-pay | Admitting: Internal Medicine

## 2019-06-20 VITALS — BP 126/72 | HR 61 | Ht 70.0 in | Wt 152.0 lb

## 2019-06-20 DIAGNOSIS — I480 Paroxysmal atrial fibrillation: Secondary | ICD-10-CM

## 2019-06-20 DIAGNOSIS — I2510 Atherosclerotic heart disease of native coronary artery without angina pectoris: Secondary | ICD-10-CM

## 2019-06-20 MED ORDER — APIXABAN 5 MG PO TABS: 5.0000 mg | ORAL_TABLET | Freq: Two times a day (BID) | ORAL | 11 refills | Status: DC

## 2019-06-20 NOTE — Telephone Encounter (Signed)
-----   Message from [name], MD sent at 06/20/2019  3:54 PM EST ----- Stop ASA Start eliquis 5mg  BID  Followup in the AF clinic in 2 weeks

## 2019-06-20 NOTE — Progress Notes (Signed)
Electrophysiology TeleHealth Note   Due to national recommendations of social distancing due to [place] 19, Audio/video telehealth visit is felt to be most appropriate for this patient at this time.  See MyChart message from today for patient consent regarding telehealth for [facility].   Date:  06/20/2019   ID:  Derrick Compton, DOB 1934/10/04, MRN [id number]  Location: home  Provider location: [place]  [place] Evaluation Performed: New patient consult  PCP:  Patient, No Pcp Per  Cardiologist:  Quay Burow, MD  Electrophysiologist:  None   Chief Complaint:  palpitations  History of Present Illness:    ABDULKARIM Compton is a 85 y.o. male who presents via audio/video conferencing for a telehealth visit today.   The patient is referred for new consultation regarding arrhythmia by Dr Gwenlyn Found and Roby Lofts.  He has a known history of CAD.  On 05/15/19, he noticed heart rates to be elevated (179-200 bpm).  This was occurring several times per day.  He had a Zio patch monitor which revealed afib (burden 2%).  The longest episode was 1 hour and 24 minutes. He is referred to EP for further evaluation.    He has chronic stable angina, more notable with exercise.  He follows with Dr Gwenlyn Found for this.  He has rare postural dizziness. Today, he denies symptoms of  shortness of breath, orthopnea, PND, lower extremity edema,  presyncope, syncope, bleeding, or neurologic sequela. The patient is tolerating medications without difficulties and is otherwise without complaint today.   he denies symptoms of cough, fevers, chills, or new SOB worrisome for COVID 19.   Past Medical History:  Diagnosis Date  . Anxiety   . BPH (benign prostatic hyperplasia)   . CAD (coronary artery disease)   . Chest pain   . Hyperlipidemia   . Osteopenia   . Paroxysmal atrial fibrillation [facility])     Past Surgical History:  Procedure Laterality Date  . LEFT HEART CATH AND CORONARY ANGIOGRAPHY N/A 02/05/2017   Procedure: LEFT HEART CATH AND CORONARY ANGIOGRAPHY;  Surgeon: Lorretta Harp, MD;  Location: [place] [facility];  Service: Cardiovascular;  Laterality: N/A;    Current Outpatient Medications  Medication Sig Dispense Refill  . ALPRAZolam (XANAX) 0.5 MG tablet Take 0.5 mg by mouth at bedtime as needed.     [name] aspirin EC 81 MG tablet Take 81 mg by mouth daily.     . Cholecalciferol (VITAMIN D3) 25 MCG (1000 UT) CAPS Vitamin D3 25 mcg (1,000 unit) capsule  Take 1 capsule every day by oral route.    . mirtazapine (REMERON) 30 MG tablet TAKE 1 TABLET BY MOUTH AT BEDTIME 30 tablet 5  . ranolazine (RANEXA) 1000 MG SR tablet TAKE 1 TABLET BY MOUTH TWICE A DAY 180 tablet 1  . rosuvastatin (CRESTOR) 40 MG tablet Take 0.5 tablets by mouth daily.  3  . sertraline (ZOLOFT) 50 MG tablet TAKE 3 TABLETS BY MOUTH EVERY DAY (Patient taking differently: Take 50 mg by mouth daily. ) 270 tablet 1   No current facility-administered medications for this visit.    Allergies:   Patient has no known allergies.   Social History:  The patient  reports that he has never smoked. He has never used smokeless tobacco. He reports that he does not drink alcohol or use drugs.   Family History:    Mother died when he was 56 years old of meningitis.  ROS:  Please see the history of present  illness.   All other systems are personally reviewed and negative.    Exam:    Vital Signs:  BP 126/72   Pulse 61   Ht 5\' 10"  (1.778 m)   Wt 152 lb (68.9 kg)   BMI 21.81 kg/m    Well appearing, alert and conversant, regular work of breathing,  good skin color Eyes- anicteric, neuro- grossly intact, skin- no apparent rash or lesions or cyanosis, mouth- oral mucosa is pink   Labs/Other Tests and Data Reviewed:    Recent Labs: 06/15/2019: BUN 17; Creatinine, Ser 0.92; Hemoglobin 14.7; Magnesium 2.3; Platelets 178; Potassium 4.5; Sodium 141; TSH 2.200   Wt Readings from Last 3 Encounters:  06/20/19 152 lb (68.9 kg)  06/15/19  156 lb (70.8 kg)  04/20/19 154 lb 9.6 oz (70.1 kg)     Other studies personally reviewed: Additional studies/ records that were reviewed today include: Dr Rachel Bo notes,  Cardiology APP notes, prior echo/ labs/ ekg and recent event monitor  Review of the above records today demonstrates: as above  ASSESSMENT & PLAN:    1.  Paroxysmal atrial fibrillation New diagnosis He has a chads2vasc score of at least 3 Stop ASA and start eliquis at this time  Follow-up in the AF clinic in 2 weeks.  I would advise that he consider diltiazem CD 120mg  daily at that time.  He worries about "low BP" though his BP seems mostly in the normal range.  I feel like he would tolerate low dose diltiazem  Ultimately, we could consider AAD therapy (tikosyn or amiodarone).  GIven advanced age, I would not advise primary ablation for this patient.  2. CAD He has chronic stable angina Followed by Dr Gwenlyn Found  Follow-up:  afib clinic in 2 weeks  Current medicines are reviewed at length with the patient today.   The patient does not have concerns regarding his medicines.  The following changes were made today:  none  Labs/ tests ordered today include:  No orders of the defined types were placed in this encounter.   Patient Risk:  after full review of this patients clinical status, I feel that they are at moderate risk at this time.   Today, I have spent 25 minutes with the patient with telehealth technology discussing new onset afib .    Signed, [name] MD, [facility] 06/20/2019 3:50 PM   [place] [address] [phone] (office) [phone] (fax)

## 2019-06-24 ENCOUNTER — Telehealth: Payer: Self-pay

## 2019-06-24 NOTE — Telephone Encounter (Signed)
-----   Message from Lorretta Harp, MD sent at 06/23/2019 11:10 AM EST ----- 1: Sinus rhythm/sinus tachycardia/sinus bradycardia (rates in the 40s) 2: Paroxysmal atrial fibrillation with variable ventricular response

## 2019-06-24 NOTE — Telephone Encounter (Signed)
Advised pt of labs. Stated that he was taking a new med for his Afib for 3 days now. Educated pt about s/s of Afib RVR and advised to call office back if having symptoms that do not subside. Verbalized understanding.

## 2019-07-04 ENCOUNTER — Ambulatory Visit (HOSPITAL_COMMUNITY)
Admission: RE | Admit: 2019-07-04 | Discharge: 2019-07-04 | Disposition: A | Discharge status: Home / Self Care | Payer: Medicare Other | Source: Ambulatory Visit | Attending: Nurse Practitioner | Admitting: Nurse Practitioner

## 2019-07-04 ENCOUNTER — Other Ambulatory Visit: Payer: Self-pay

## 2019-07-04 VITALS — BP 112/50 | HR 69 | Ht 70.0 in | Wt 159.2 lb

## 2019-07-04 DIAGNOSIS — D6869 Other thrombophilia: Secondary | ICD-10-CM

## 2019-07-04 DIAGNOSIS — Z87442 Personal history of urinary calculi: Secondary | ICD-10-CM | POA: Insufficient documentation

## 2019-07-04 DIAGNOSIS — Z79899 Other long term (current) drug therapy: Secondary | ICD-10-CM | POA: Diagnosis not present

## 2019-07-04 DIAGNOSIS — I2510 Atherosclerotic heart disease of native coronary artery without angina pectoris: Secondary | ICD-10-CM | POA: Diagnosis not present

## 2019-07-04 DIAGNOSIS — E785 Hyperlipidemia, unspecified: Secondary | ICD-10-CM | POA: Insufficient documentation

## 2019-07-04 DIAGNOSIS — I480 Paroxysmal atrial fibrillation: Secondary | ICD-10-CM | POA: Insufficient documentation

## 2019-07-04 DIAGNOSIS — I4891 Unspecified atrial fibrillation: Secondary | ICD-10-CM | POA: Diagnosis present

## 2019-07-04 DIAGNOSIS — M8580 Other specified disorders of bone density and structure, unspecified site: Secondary | ICD-10-CM | POA: Diagnosis not present

## 2019-07-04 DIAGNOSIS — Z7901 Long term (current) use of anticoagulants: Secondary | ICD-10-CM | POA: Diagnosis not present

## 2019-07-04 DIAGNOSIS — F419 Anxiety disorder, unspecified: Secondary | ICD-10-CM | POA: Diagnosis not present

## 2019-07-04 MED ORDER — DILTIAZEM HCL 30 MG PO TABS: ORAL_TABLET | ORAL | 1 refills | Status: DC

## 2019-07-04 NOTE — Progress Notes (Signed)
Primary Care Physician: Patient, No Pcp Per Referring Physician: Dr. Rayann Heman Cardiologist: Dr. Lyman Speller Derrick Compton is a 85 y.o. male with a h/o CAD,mild aortic stenosis, kidney stones, that is in the afib clinic for a Zio patch noting new onset afib. The monitor was placed after pt noted palpitations, more notable at the gym, swinging the kettle bell, where he will often drink a Red Bull before his work out.  He does not smoke, minimal alcohol, no snoring/apnea. He is slim and works out on a regular basis.   He was placed on eliquis 5 mg bid for a CHA2DS2VASc 3. Dr. Rayann Heman wanted to  start daily Cardizem but his BP is soft at times  plus monitor did also show some  Bradycardia.   He is in SR today with PAC's in a bigeminal rhythm.     Today, he denies symptoms of palpitations, chest pain, shortness of breath, orthopnea, PND, lower extremity edema, dizziness, presyncope, syncope, or neurologic sequela. The patient is tolerating medications without difficulties and is otherwise without complaint today.   Past Medical History:  Diagnosis Date  . Anxiety   . BPH (benign prostatic hyperplasia)   . CAD (coronary artery disease)   . Chest pain   . Hyperlipidemia   . Osteopenia   . Paroxysmal atrial fibrillation [facility])    Past Surgical History:  Procedure Laterality Date  . LEFT HEART CATH AND CORONARY ANGIOGRAPHY N/A 02/05/2017   Procedure: LEFT HEART CATH AND CORONARY ANGIOGRAPHY;  Surgeon: Lorretta Harp, MD;  Location: [place] [facility];  Service: Cardiovascular;  Laterality: N/A;    Current Outpatient Medications  Medication Sig Dispense Refill  . ALPRAZolam (XANAX) 0.5 MG tablet Take 0.5 mg by mouth at bedtime as needed.     [name] apixaban (ELIQUIS) 5 MG TABS tablet Take 1 tablet (5 mg total) by mouth 2 (two) times daily. 60 tablet 11  . Cholecalciferol (VITAMIN D3) 25 MCG (1000 UT) CAPS Vitamin D3 25 mcg (1,000 unit) capsule  Take 1 capsule every day by oral route.    .  mirtazapine (REMERON) 30 MG tablet TAKE 1 TABLET BY MOUTH AT BEDTIME 30 tablet 5  . rosuvastatin (CRESTOR) 40 MG tablet Take by mouth daily.   3  . sertraline (ZOLOFT) 50 MG tablet TAKE 3 TABLETS BY MOUTH EVERY DAY (Patient taking differently: Take 50 mg by mouth daily. ) 270 tablet 1  . diltiazem (CARDIZEM) 30 MG tablet Take 1 tablet every 4 hours AS NEEDED for AFIB heart rate >100 45 tablet 1   No current facility-administered medications for this encounter.    No Known Allergies  Social History   Socioeconomic History  . Marital status: Married    Spouse name: Not on file  . Number of children: Not on file  . Years of education: Not on file  . Highest education level: Not on file  Occupational History  . Not on file  Tobacco Use  . Smoking status: Never Smoker  . Smokeless tobacco: Never Used  Substance and Sexual Activity  . Alcohol use: No  . Drug use: No  . Sexual activity: Not on file  Other Topics Concern  . Not on file  Social History Narrative   Lives in [place] with wife.      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   Social Determinants of Health   Financial Resource Strain:   . Difficulty of Paying Living Expenses: Not  on file  Food Insecurity:   . Worried About Charity fundraiser in the Last Year: Not on file  . Ran Out of Food in the Last Year: Not on file  Transportation Needs:   . Lack of Transportation (Medical): Not on file  . Lack of Transportation (Non-Medical): Not on file  Physical Activity:   . Days of Exercise per Week: Not on file  . Minutes of Exercise per Session: Not on file  Stress:   . Feeling of Stress : Not on file  Social Connections:   . Frequency of Communication with Friends and Family: Not on file  . Frequency of Social Gatherings with Friends and Family: Not on file  . Attends Religious Services: Not on file  . Active Member of Clubs or Organizations: Not on file  . Attends Archivist  Meetings: Not on file  . Marital Status: Not on file  Intimate Partner Violence:   . Fear of Current or Ex-Partner: Not on file  . Emotionally Abused: Not on file  . Physically Abused: Not on file  . Sexually Abused: Not on file    No family history on file.  ROS- All systems are reviewed and negative except as per the HPI above  Physical Exam: Vitals:   07/04/19 0836  BP: (!) 112/50  Pulse: 69  Weight: 72.2 kg  Height: 5\' 10"  (1.778 m)   Wt Readings from Last 3 Encounters:  07/04/19 72.2 kg  06/20/19 68.9 kg  06/15/19 70.8 kg    Labs: Lab Results  Component Value Date   NA 141 06/15/2019   K 4.5 06/15/2019   CL 101 06/15/2019   CO2 26 06/15/2019   GLUCOSE 101 (H) 06/15/2019   BUN 17 06/15/2019   CREATININE 0.92 06/15/2019   CALCIUM 10.0 06/15/2019   MG 2.3 06/15/2019   Lab Results  Component Value Date   INR 1.1 01/27/2017   Lab Results  Component Value Date   CHOL 196 02/04/2011   HDL 63 02/04/2011   LDLCALC 113 (H) 02/04/2011   TRIG 99 02/04/2011     GEN- The patient is well appearing, alert and oriented x 3 today.   Head- normocephalic, atraumatic Eyes-  Sclera clear, conjunctiva pink Ears- hearing intact Oropharynx- clear Neck- supple, no JVP Lymph- no cervical lymphadenopathy Lungs- Clear to ausculation bilaterally, normal work of breathing Heart- Regular rate and rhythm, no murmurs, rubs or gallops, PMI not laterally displaced GI- soft, NT, ND, + BS Extremities- no clubbing, cyanosis, or edema MS- no significant deformity or atrophy Skin- no rash or lesion Psych- euthymic mood, full affect Neuro- strength and sensation are intact  EKG- SR with PAC's in a bigeminal rhythm, LAFB, v rate of 69 bpm, pr int 196 ms, qrs int 118 ms, qtc 415 ms    Echo-1. The left ventricle has normal systolic function, with an ejection  fraction of 55-60%. The cavity size was normal. Left ventricular diastolic  Doppler parameters are consistent with impaired  relaxation. Indeterminate  filling pressures.  2. The right ventricle has normal systolic function. The cavity was  normal. There is no increase in right ventricular wall thickness. Right  ventricular systolic pressure could not be assessed.  3. The mitral valve is abnormal. Mild thickening of the mitral valve  leaflet. There is mild mitral annular calcification present.  4. The aortic valve is abnormal. Mild calcification of the aortic valve.  Aortic valve regurgitation is trivial by color flow Doppler. Mild stenosis  of the aortic valve. Maximum velocity obtained in the apical view.  5. AV Mean Sys Grad:9.0 mmHg.  6. There is mild dilatation of the ascending aorta measuring 41 mm.   Zio patch-Preliminary Findings Patient had a min HR of 42 bpm, max HR of 139 bpm, and avg HR of 61 bpm. Predominant underlying rhythm was Sinus Rhythm. Bundle Branch Block/IVCD was present. 45 Supraventricular Tachycardia runs occurred, the run with the fastest interval lasting 16 beats with a max rate of 139 bpm, the longest lasting 16 beats with an avg rate of 98 bpm. Atrial Fibrillation occurred (2% burden), ranging from 58-137 bpm (avg of 99 bpm), the longest lasting 1 hour 24 mins with an avg rate of 99 bpm. Atrial Fibrillation was detected within +/- 45 seconds of symptomatic patient event(s). Isolated SVEs were frequent (11.1%, [id number]), SVE Couplets were rare (<1.0%, 3318), and SVE Triplets were rare (<1.0%, 145). Isolated VEs were rare (<1.0%), and no VE Couplets or VE Triplets were present.  Assessment and Plan: 1. Paroxysmal afib  New onset General education re afib  Since daily Cardizem may not be tolerated for soft BP's and at times bradycardia, will rx 30 mg Cardizem to use as needed for breakthrough afib Advised to minimize caffeine and to avoid Red Bull drinks  2. CHA2DS2VASc of 3  Continue eliquis 5 mg bid  General precautions re blood thinners discussed   F/u in one month   Butch Penny C. Chantil Bari, [facility] [address] [place], [place] [address] [phone]

## 2019-07-04 NOTE — Patient Instructions (Signed)
Cardizem 30mg  -- Take 1 tablet every 4 hours AS NEEDED for heart rate >100 as long as top number of blood pressure >100.

## 2019-07-06 ENCOUNTER — Ambulatory Visit (HOSPITAL_COMMUNITY): Payer: Medicare Other | Admitting: Nurse Practitioner

## 2019-07-09 NOTE — Progress Notes (Signed)
Results given by Derl Barrow over phone in dec 2020  IMPRESSION: No acute findings.  Interval resolution of patchy ground-glass and airspace opacity in the superior right lower lobe, consistent with resolving inflammatory or infectious etiology.  Stable reticulonodular and ground-glass opacity in the posterior right upper lobe with associated mild bronchiectasis, suggesting chronic postinflammatory scarring. Recommend continued follow-up by chest CT in 12 months to confirm continued stability.  Aortic Atherosclerosis (ICD10-I70.0).   Electronically Signed   By: Marlaine Hind M.D.   On: 05/13/2019 21:15

## 2019-07-13 ENCOUNTER — Encounter (HOSPITAL_COMMUNITY): Payer: Self-pay | Admitting: Nurse Practitioner

## 2019-07-18 ENCOUNTER — Telehealth: Payer: Self-pay | Admitting: Cardiovascular Disease

## 2019-07-18 NOTE — Telephone Encounter (Signed)
Okay to have Echo scheduled. Sent message to scheduling to make appt.

## 2019-07-18 NOTE — Telephone Encounter (Signed)
Patient states that he went to the [facility] for afib. While there they advised him to have an echo done. I see an order for an echo with an expected date for June. Just want to make sure it is okay to go ahead and schedule this echo now. I can call the patient back and schedule.

## 2019-07-19 ENCOUNTER — Ambulatory Visit (HOSPITAL_COMMUNITY): Payer: Medicare Other | Attending: Cardiology

## 2019-07-19 ENCOUNTER — Telehealth (HOSPITAL_COMMUNITY): Payer: Self-pay | Admitting: Cardiovascular Disease

## 2019-07-19 ENCOUNTER — Other Ambulatory Visit: Payer: Self-pay

## 2019-07-19 DIAGNOSIS — Z85038 Personal history of other malignant neoplasm of large intestine: Secondary | ICD-10-CM | POA: Insufficient documentation

## 2019-07-19 DIAGNOSIS — I2510 Atherosclerotic heart disease of native coronary artery without angina pectoris: Secondary | ICD-10-CM | POA: Insufficient documentation

## 2019-07-19 DIAGNOSIS — E785 Hyperlipidemia, unspecified: Secondary | ICD-10-CM | POA: Insufficient documentation

## 2019-07-19 DIAGNOSIS — I350 Nonrheumatic aortic (valve) stenosis: Secondary | ICD-10-CM

## 2019-07-19 NOTE — Telephone Encounter (Signed)
Called patient and informed that he does not need echo scheduled today 07/19/2019 and needs to be rescheduled til June 2021 ( per [facility]) .

## 2019-08-01 ENCOUNTER — Other Ambulatory Visit: Payer: Self-pay

## 2019-08-01 ENCOUNTER — Ambulatory Visit (HOSPITAL_COMMUNITY)
Admission: RE | Admit: 2019-08-01 | Discharge: 2019-08-01 | Disposition: A | Discharge status: Home / Self Care | Payer: Medicare Other | Source: Ambulatory Visit | Attending: Nurse Practitioner | Admitting: Nurse Practitioner

## 2019-08-01 VITALS — BP 100/50 | HR 51 | Ht 70.0 in | Wt 159.8 lb

## 2019-08-01 DIAGNOSIS — M8580 Other specified disorders of bone density and structure, unspecified site: Secondary | ICD-10-CM | POA: Diagnosis not present

## 2019-08-01 DIAGNOSIS — F419 Anxiety disorder, unspecified: Secondary | ICD-10-CM | POA: Insufficient documentation

## 2019-08-01 DIAGNOSIS — I2510 Atherosclerotic heart disease of native coronary artery without angina pectoris: Secondary | ICD-10-CM | POA: Diagnosis not present

## 2019-08-01 DIAGNOSIS — Z7901 Long term (current) use of anticoagulants: Secondary | ICD-10-CM | POA: Insufficient documentation

## 2019-08-01 DIAGNOSIS — D6869 Other thrombophilia: Secondary | ICD-10-CM | POA: Diagnosis not present

## 2019-08-01 DIAGNOSIS — I480 Paroxysmal atrial fibrillation: Secondary | ICD-10-CM | POA: Diagnosis not present

## 2019-08-01 DIAGNOSIS — Z79899 Other long term (current) drug therapy: Secondary | ICD-10-CM | POA: Insufficient documentation

## 2019-08-01 DIAGNOSIS — N400 Enlarged prostate without lower urinary tract symptoms: Secondary | ICD-10-CM | POA: Diagnosis not present

## 2019-08-01 DIAGNOSIS — E785 Hyperlipidemia, unspecified: Secondary | ICD-10-CM | POA: Insufficient documentation

## 2019-08-01 NOTE — Progress Notes (Signed)
Primary Care Physician: Alonna Buckler, MD Referring Physician: Dr. Rayann Heman Cardiologist: Dr. Lyman Speller BUBBA DAYKIN is a 85 y.o. male with a h/o CAD,mild aortic stenosis, kidney stones, that is in the afib clinic for a Zio patch noting new onset afib. The monitor was placed after pt noted palpitations, more notable at the gym, swinging the kettle bell, where he will often drink a Red Bull before his work out.  He does not smoke, minimal alcohol, no snoring/apnea. He is slim and works out on a regular basis.   He was placed on eliquis 5 mg bid for a CHA2DS2VASc 3. Dr. Rayann Heman wanted to  start daily Cardizem but his BP is soft at times  plus monitor did also show some  Bradycardia.   He is in SR today with PAC's in a bigeminal rhythm.     F/u in afib clinic, 08/01/19. Since I saw pt, he was seen in the [facility] in [place] as he has property there, and wanted a second opinion. He was seen by EP, who discussed with general cardiology as well 2/2 his exertional chest pain and his prior known 90%  blockage. It was suggested by EP to start metoprolol xl 50 mg qd and it also states that pt is on ranexa 1000 mg bid which our med list does not substantiate. Pt did take 2 BB doses and it made him feel worse and he stopped it. He would also like to stop eliquis but his  CHA2DS2VASc is 3 so by guidelines this would not be recommended.EP   also discussed antiarrythmic's or ablation may be indicated. Again with an ablation, pt thought he may be able to get rid of anticoagulation but again this is not guidelines. His afib burden overall is low, having 2 fifteen min episodes in the last month, resolved without intervention. His biggest concern, as well as the MD's that discussed his case at [facility],  was his complaints of increase in exertional chest pain and shortness of breath that has escalated in the last several months.  He did have a spell of this recently playing golf. The exertional chest pain is not  associated with afib episodes. His resting HR of 51 and soft BP make it difficult to use daily rate control meds.   Today, he denies symptoms of palpitations, chest pain, shortness of breath, orthopnea, PND, lower extremity edema, dizziness, presyncope, syncope, or neurologic sequela. The patient is tolerating medications without difficulties and is otherwise without complaint today.   Past Medical History:  Diagnosis Date  . Anxiety   . BPH (benign prostatic hyperplasia)   . CAD (coronary artery disease)   . Chest pain   . Hyperlipidemia   . Osteopenia   . Paroxysmal atrial fibrillation [facility])    Past Surgical History:  Procedure Laterality Date  . LEFT HEART CATH AND CORONARY ANGIOGRAPHY N/A 02/05/2017   Procedure: LEFT HEART CATH AND CORONARY ANGIOGRAPHY;  Surgeon: Lorretta Harp, MD;  Location: [facility];  Service: Cardiovascular;  Laterality: N/A;    Current Outpatient Medications  Medication Sig Dispense Refill  . ALPRAZolam (XANAX) 0.5 MG tablet Take 0.5 mg by mouth at bedtime as needed.     [name] apixaban (ELIQUIS) 5 MG TABS tablet Take 1 tablet (5 mg total) by mouth 2 (two) times daily. 60 tablet 11  . Cholecalciferol (VITAMIN D3) 25 MCG (1000 UT) CAPS Vitamin D3 25 mcg (1,000 unit) capsule  Take 1 capsule every day by  oral route.    . diltiazem (CARDIZEM) 30 MG tablet Take 1 tablet every 4 hours AS NEEDED for AFIB heart rate >100 45 tablet 1  . metoprolol succinate (TOPROL-XL) 50 MG 24 hr tablet Take 50 mg by mouth daily.    . mirtazapine (REMERON) 30 MG tablet TAKE 1 TABLET BY MOUTH AT BEDTIME 30 tablet 5  . rosuvastatin (CRESTOR) 40 MG tablet Take by mouth daily.   3  . sertraline (ZOLOFT) 50 MG tablet TAKE 3 TABLETS BY MOUTH EVERY DAY (Patient taking differently: Take 50 mg by mouth daily. ) 270 tablet 1   No current facility-administered medications for this encounter.    No Known Allergies  Social History   Socioeconomic History  . Marital status: Married      Spouse name: Not on file  . Number of children: Not on file  . Years of education: Not on file  . Highest education level: Not on file  Occupational History  . Not on file  Tobacco Use  . Smoking status: Never Smoker  . Smokeless tobacco: Never Used  Substance and Sexual Activity  . Alcohol use: No  . Drug use: No  . Sexual activity: Not on file  Other Topics Concern  . Not on file  Social History Narrative   Lives in [place] with wife.      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   Social Determinants of Health   Financial Resource Strain:   . Difficulty of Paying Living Expenses: Not on file  Food Insecurity:   . Worried About Charity fundraiser in the Last Year: Not on file  . Ran Out of Food in the Last Year: Not on file  Transportation Needs:   . Lack of Transportation (Medical): Not on file  . Lack of Transportation (Non-Medical): Not on file  Physical Activity:   . Days of Exercise per Week: Not on file  . Minutes of Exercise per Session: Not on file  Stress:   . Feeling of Stress : Not on file  Social Connections:   . Frequency of Communication with Friends and Family: Not on file  . Frequency of Social Gatherings with Friends and Family: Not on file  . Attends Religious Services: Not on file  . Active Member of Clubs or Organizations: Not on file  . Attends Archivist Meetings: Not on file  . Marital Status: Not on file  Intimate Partner Violence:   . Fear of Current or Ex-Partner: Not on file  . Emotionally Abused: Not on file  . Physically Abused: Not on file  . Sexually Abused: Not on file    No family history on file.  ROS- All systems are reviewed and negative except as per the HPI above  Physical Exam: Vitals:   08/01/19 0845  BP: (!) 100/50  Pulse: (!) 51  Weight: 72.5 kg  Height: 5\' 10"  (1.778 m)   Wt Readings from Last 3 Encounters:  08/01/19 72.5 kg  07/04/19 72.2 kg  06/20/19 68.9 kg     Labs: Lab Results  Component Value Date   NA 141 06/15/2019   K 4.5 06/15/2019   CL 101 06/15/2019   CO2 26 06/15/2019   GLUCOSE 101 (H) 06/15/2019   BUN 17 06/15/2019   CREATININE 0.92 06/15/2019   CALCIUM 10.0 06/15/2019   MG 2.3 06/15/2019   Lab Results  Component Value Date   INR 1.1 01/27/2017   Lab Results  Component Value Date   CHOL 196 02/04/2011   HDL 63 02/04/2011   LDLCALC 113 (H) 02/04/2011   TRIG 99 02/04/2011     GEN- The patient is well appearing, alert and oriented x 3 today.   Head- normocephalic, atraumatic Eyes-  Sclera clear, conjunctiva pink Ears- hearing intact Oropharynx- clear Neck- supple, no JVP Lymph- no cervical lymphadenopathy Lungs- Clear to ausculation bilaterally, normal work of breathing Heart- Regular rate and rhythm, no murmurs, rubs or gallops, PMI not laterally displaced GI- soft, NT, ND, + BS Extremities- no clubbing, cyanosis, or edema MS- no significant deformity or atrophy Skin- no rash or lesion Psych- euthymic mood, full affect Neuro- strength and sensation are intact  EKG- SR with sinus arrythmia, LAFB, HR at 51 bpm, qrs int 118 ms, qtc 366 ms  Echo-1. The left ventricle has normal systolic function, with an ejection  fraction of 55-60%. The cavity size was normal. Left ventricular diastolic  Doppler parameters are consistent with impaired relaxation. Indeterminate  filling pressures.  2. The right ventricle has normal systolic function. The cavity was  normal. There is no increase in right ventricular wall thickness. Right  ventricular systolic pressure could not be assessed.  3. The mitral valve is abnormal. Mild thickening of the mitral valve  leaflet. There is mild mitral annular calcification present.  4. The aortic valve is abnormal. Mild calcification of the aortic valve.  Aortic valve regurgitation is trivial by color flow Doppler. Mild stenosis  of the aortic valve. Maximum velocity obtained in the  apical view.  5. AV Mean Sys Grad:9.0 mmHg.  6. There is mild dilatation of the ascending aorta measuring 41 mm.   Zio patch-Preliminary Findings Patient had a min HR of 42 bpm, max HR of 139 bpm, and avg HR of 61 bpm. Predominant underlying rhythm was Sinus Rhythm. Bundle Branch Block/IVCD was present. 45 Supraventricular Tachycardia runs occurred, the run with the fastest interval lasting 16 beats with a max rate of 139 bpm, the longest lasting 16 beats with an avg rate of 98 bpm. Atrial Fibrillation occurred (2% burden), ranging from 58-137 bpm (avg of 99 bpm), the longest lasting 1 hour 24 mins with an avg rate of 99 bpm. Atrial Fibrillation was detected within +/- 45 seconds of symptomatic patient event(s). Isolated SVEs were frequent (11.1%, [id number]), SVE Couplets were rare (<1.0%, 3318), and SVE Triplets were rare (<1.0%, 145). Isolated VEs were rare (<1.0%), and no VE Couplets or VE Triplets were present.  Assessment and Plan: 1. Paroxysmal afib  New onset So far burden appears to be low General education re afib  He was rx daily cardizem but did not take for concerns for bradycardia/soft BP [facility] EP gave him toprol xl 50 mg daily but he took 2 doses and stopped as it made him feel bad He would prefer not to take meds Advised to minimize caffeine and to avoid Red Bull drinks He would like to talk to Dr. Rayann Heman  again re ablation as this was something that was discussed with EP at [facility]  2. CHA2DS2VASc of 3  Continue eliquis 5 mg bid  He would like to stop med I reinforced this would not be by guidelines and could increase his risk of stroke  2. Known CAD with increase of exertional symptoms This was also discussed at [facility] BB was for CAD as well as for afib, per pt  Pt could not tolerate and stopped this  Their records indicate he is on  ranexa but is not on our med lest I will request to move up appointment with Dr. Gwenlyn Found  from 4/20   afib clinic as  needed  Butch Penny C. Brielyn Bosak, [facility] [address] [place], [place] [address] [phone]

## 2019-08-01 NOTE — Addendum Note (Signed)
Encounter addended by: Sherran Needs, NP on: 08/01/2019 11:40 AM  Actions taken: Flowsheet accepted, Level of Service modified, Visit diagnoses modified

## 2019-08-16 ENCOUNTER — Ambulatory Visit: Payer: Medicare Other | Admitting: Psychiatry

## 2019-08-17 ENCOUNTER — Ambulatory Visit (INDEPENDENT_AMBULATORY_CARE_PROVIDER_SITE_OTHER): Payer: Medicare Other | Admitting: Cardiovascular Disease

## 2019-08-17 ENCOUNTER — Encounter: Payer: Self-pay | Admitting: Cardiovascular Disease

## 2019-08-17 ENCOUNTER — Other Ambulatory Visit: Payer: Self-pay

## 2019-08-17 DIAGNOSIS — E782 Mixed hyperlipidemia: Secondary | ICD-10-CM | POA: Diagnosis not present

## 2019-08-17 DIAGNOSIS — I25118 Atherosclerotic heart disease of native coronary artery with other forms of angina pectoris: Secondary | ICD-10-CM

## 2019-08-17 DIAGNOSIS — I480 Paroxysmal atrial fibrillation: Secondary | ICD-10-CM

## 2019-08-17 DIAGNOSIS — R0602 Shortness of breath: Secondary | ICD-10-CM

## 2019-08-17 DIAGNOSIS — R072 Precordial pain: Secondary | ICD-10-CM

## 2019-08-17 DIAGNOSIS — I2510 Atherosclerotic heart disease of native coronary artery without angina pectoris: Secondary | ICD-10-CM

## 2019-08-17 MED ORDER — METOPROLOL TARTRATE 50 MG PO TABS: ORAL_TABLET | ORAL | 0 refills | Status: DC

## 2019-08-17 NOTE — Progress Notes (Signed)
08/17/2019 Derrick Compton   01-05-1935  [id number]  Primary Physician Alonna Buckler, MD Primary Cardiologist: Lorretta Harp MD Renae Gloss  HPI:  Derrick Compton is a 85 y.o.  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 He was referred by the [facility] clinic for cardiovascular evaluation because of proximity and progressive chest pain. I last saw him virtually for video telemedicine visit 09/22/2018.  His only cardiovascular risk factor is hyperlipidemia. He did have a coronary calcium score performed 10/04/15 which was 181 a subsequent GXT which was normal. He exercises frequently has noticed progressive substernal chest pain with running. I perform Myoview stress testing on 11/11/16 which was entirely normal 2-D echo performed 12/09/16 showed mild aortic stenosis. He studies continued symptoms I ordered a coronary CTA/FFR on 12/24/16 showed a significant lesion in a diagonal branch.  He underwent cardiac catheterization by myself 02/05/17 revealing a 90% ostial high first diagonal branch stenosis which came off orthogonal to the LAD making percutaneous intervention high risk. I do not think that the risk of intervention would be worth potential benefit of revascularization. The patient, because of his relative hypotension and bradycardia is not a good candidate for anginal medications.  Since I saw him a year ago he has developed A. fib seen on an event monitor 06/23/2019.  He did see Dr. Rayann Heman in the clinic for evaluation of this and was placed on Eliquis.  Over the last several months he is noticed increasing dyspnea on exertion as well as substernal chest pressure on exertion principally.   Current Meds  Medication Sig  . ALPRAZolam (XANAX) 0.5 MG tablet Take 0.5 mg by mouth at bedtime as needed.   [name] apixaban (ELIQUIS) 5 MG TABS tablet Take 1 tablet (5  mg total) by mouth 2 (two) times daily.  . Cholecalciferol (VITAMIN D3) 25 MCG (1000 UT) CAPS Vitamin D3 25 mcg (1,000 unit) capsule  Take 1 capsule every day by oral route.  . diltiazem (CARDIZEM) 30 MG tablet Take 1 tablet every 4 hours AS NEEDED for AFIB heart rate >100  . MAGNESIUM OXIDE PO Take 1 tablet by mouth daily.  . mirtazapine (REMERON) 30 MG tablet TAKE 1 TABLET BY MOUTH AT BEDTIME  . Omega-3 Fatty Acids (OMEGA-3 FISH OIL PO) Take 1 tablet by mouth daily.  . rosuvastatin (CRESTOR) 40 MG tablet Take by mouth daily.   . sertraline (ZOLOFT) 50 MG tablet TAKE 3 TABLETS BY MOUTH EVERY DAY (Patient taking differently: Take 50 mg by mouth daily. )  . [DISCONTINUED] metoprolol succinate (TOPROL-XL) 50 MG 24 hr tablet Take 50 mg by mouth daily.     No Known Allergies  Social History   Socioeconomic History  . Marital status: Married    Spouse name: Not on file  . Number of children: Not on file  . Years of education: Not on file  . Highest education level: Not on file  Occupational History  . Not on file  Tobacco Use  . Smoking status: Never Smoker  . Smokeless tobacco: Never Used  Substance and Sexual Activity  . Alcohol use: No  . Drug use: No  . Sexual activity: Not on file  Other Topics Concern  . Not on file  Social History Narrative   Lives in [place] with wife.      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  Social Determinants of Health   Financial Resource Strain:   . Difficulty of Paying Living Expenses:   Food Insecurity:   . Worried About Charity fundraiser in the Last Year:   . Arboriculturist in the Last Year:   Transportation Needs:   . Film/video editor (Medical):   [name] Lack of Transportation (Non-Medical):   Physical Activity:   . Days of Exercise per Week:   . Minutes of Exercise per Session:   Stress:   . Feeling of Stress :   Social Connections:   . Frequency of Communication with Friends and Family:   .  Frequency of Social Gatherings with Friends and Family:   . Attends Religious Services:   . Active Member of Clubs or Organizations:   . Attends Archivist Meetings:   [name] Marital Status:   Intimate Partner Violence:   . Fear of Current or Ex-Partner:   . Emotionally Abused:   [name] Physically Abused:   . Sexually Abused:      Review of Systems: General: negative for chills, fever, night sweats or weight changes.  Cardiovascular: negative for chest pain, dyspnea on exertion, edema, orthopnea, palpitations, paroxysmal nocturnal dyspnea or shortness of breath Dermatological: negative for rash Respiratory: negative for cough or wheezing Urologic: negative for hematuria Abdominal: negative for nausea, vomiting, diarrhea, bright red blood per rectum, melena, or hematemesis Neurologic: negative for visual changes, syncope, or dizziness All other systems reviewed and are otherwise negative except as noted above.    Blood pressure (!) 108/56, pulse (!) 56, height 5\' 10"  (1.778 m), weight 149 lb (67.6 kg).  General appearance: alert and no distress Neck: no adenopathy, no carotid bruit, no JVD, supple, symmetrical, trachea midline and thyroid not enlarged, symmetric, no tenderness/mass/nodules Lungs: clear to auscultation bilaterally Heart: regular rate and rhythm, S1, S2 normal, no murmur, click, rub or gallop Extremities: extremities normal, atraumatic, no cyanosis or edema Pulses: 2+ and symmetric Skin: Skin color, texture, turgor normal. No rashes or lesions Neurologic: Alert and oriented X 3, normal strength and tone. Normal symmetric reflexes. Normal coordination and gait  EKG not performed today  ASSESSMENT AND PLAN:   Hyperlipidemia History of hyperlipidemia on statin therapy followed by his PCP.  Coronary artery disease History of CAD status post cardiac catheterization by myself after CT FFR showed a significant diagonal branch lesion on 02/05/2017 revealing a high-grade  ostial diagonal branch stenosis which came off orthogonally making intervention technically challenging.  He had no other significant CAD and he had normal LV function.  The plan was to treat him medically.  He has done well for the last several years in the last 6 months ago when he is noticed increasing dyspnea on exertion and exertional chest pain.  Recent 2D echo revealed normal LV systolic function without valvular abnormalities.  Ongoing repeat coronary CTA to further evaluate.  Paroxysmal atrial fibrillation (HCC) History of PAF maintaining sinus rhythm on Eliquis oral anticoagulation.  This was seen on event monitoring which resulted 06/23/2019 and has seen Dr. Rayann Heman in the EP clinic as well as [name].      Lorretta Harp MD FACP,FACC,FAHA, [facility] 08/17/2019 11:14 AM

## 2019-08-17 NOTE — Patient Instructions (Signed)
Medication Instructions:   Take 1 tablet of Metoprolol (Lopressor) 50 mg, 2 hours prior to your Cardiac CTA.  *If you need a refill on your cardiac medications before your next appointment, please call your pharmacy*   Lab Work: Your physician recommends that you return for lab work: BMET (1 week prior to CTA).  If you have labs (blood work) drawn today and your tests are completely normal, you will receive your results only by: [name] MyChart Message (if you have MyChart) OR . A paper copy in the mail If you have any lab test that is abnormal or we need to change your treatment, we will call you to review the results.   Testing/Procedures: Scheduler will call you to arrange your Cardiac CTA when it has been approved by your insurance. Please see instructions below.   Follow-Up: At [facility], you and your health needs are our priority.  As part of our continuing mission to provide you with exceptional heart care, we have created designated Provider Care Teams.  These Care Teams include your primary Cardiologist (physician) and Advanced Practice Providers (APPs -  Physician Assistants and Nurse Practitioners) who all work together to provide you with the care you need, when you need it.  We recommend signing up for the patient portal called "MyChart".  Sign up information is provided on this After Visit Summary.  MyChart is used to connect with patients for Virtual Visits (Telemedicine).  Patients are able to view lab/test results, encounter notes, upcoming appointments, etc.  Non-urgent messages can be sent to your provider as well.   To learn more about what you can do with MyChart, go to [email or web address].    Your next appointment:   6 week(s)  The format for your next appointment:   In Person  Provider:   Quay Burow, MD   Other Instructions  Your cardiac CT will be scheduled at one of the below locations:   [facility] [address] [place], [place] [address] [phone]  [place] [address] [place], [place] [address] [phone]  If scheduled at [facility], please arrive at the [facility] main entrance of [facility] 30 minutes prior to test start time. Proceed to the [facility] Radiology Department (first floor) to check-in and test prep.  If scheduled at [facility], please arrive 15 mins early for check-in and test prep.  Please follow these instructions carefully (unless otherwise directed):  Hold all erectile dysfunction medications at least 3 days (72 hrs) prior to test.  On the Night Before the Test: . Be sure to Drink plenty of water. . Do not consume any caffeinated/decaffeinated beverages or chocolate 12 hours prior to your test. . Do not take any antihistamines 12 hours prior to your test.  On the Day of the Test: . Drink plenty of water. Do not drink any water within one hour of the test. . Do not eat any food 4 hours prior to the test. . You may take your regular medications prior to the test.  . Take metoprolol (Lopressor) two hours prior to test.      After the Test: . Drink plenty of water. . After receiving IV contrast, you may experience a mild flushed feeling. This is normal. . On occasion, you may experience a mild rash up to 24 hours after the test. This is not dangerous. If this occurs, you can take Benadryl 25  mg and increase your fluid intake. . If you experience trouble breathing, this can be serious. If it is severe call 911 IMMEDIATELY. If it is mild, please call our office. . If you take any of these medications: Glipizide/Metformin, Avandament, Glucavance, please do not take 48 hours after completing test unless otherwise instructed.   Once we have confirmed authorization from your insurance company, we will call you to set up a date and time for your test.   For  non-scheduling related questions, please contact the cardiac imaging nurse navigator should you have any questions/concerns: Marchia Bond, RN Navigator Cardiac Imaging [facility] [phone] office  For scheduling needs, including cancellations and rescheduling, please call [phone].

## 2019-08-17 NOTE — Assessment & Plan Note (Signed)
History of CAD status post cardiac catheterization by myself after CT FFR showed a significant diagonal branch lesion on 02/05/2017 revealing a high-grade ostial diagonal branch stenosis which came off orthogonally making intervention technically challenging.  He had no other significant CAD and he had normal LV function.  The plan was to treat him medically.  He has done well for the last several years in the last 6 months ago when he is noticed increasing dyspnea on exertion and exertional chest pain.  Recent 2D echo revealed normal LV systolic function without valvular abnormalities.  Ongoing repeat coronary CTA to further evaluate.

## 2019-08-17 NOTE — Assessment & Plan Note (Signed)
History of PAF maintaining sinus rhythm on Eliquis oral anticoagulation.  This was seen on event monitoring which resulted 06/23/2019 and has seen Dr. Rayann Heman in the EP clinic as well as [name].

## 2019-08-17 NOTE — Assessment & Plan Note (Signed)
History of hyperlipidemia on statin therapy followed by his PCP 

## 2019-08-23 ENCOUNTER — Ambulatory Visit (INDEPENDENT_AMBULATORY_CARE_PROVIDER_SITE_OTHER): Payer: Medicare Other | Admitting: Sports Medicine

## 2019-08-23 ENCOUNTER — Other Ambulatory Visit: Payer: Self-pay

## 2019-08-23 ENCOUNTER — Encounter: Payer: Self-pay | Admitting: Sports Medicine

## 2019-08-23 VITALS — BP 93/51 | HR 60 | Temp 97.9°F | Resp 16

## 2019-08-23 DIAGNOSIS — M79674 Pain in right toe(s): Secondary | ICD-10-CM | POA: Diagnosis not present

## 2019-08-23 DIAGNOSIS — Q828 Other specified congenital malformations of skin: Secondary | ICD-10-CM

## 2019-08-23 DIAGNOSIS — B351 Tinea unguium: Secondary | ICD-10-CM | POA: Diagnosis not present

## 2019-08-23 DIAGNOSIS — I2510 Atherosclerotic heart disease of native coronary artery without angina pectoris: Secondary | ICD-10-CM

## 2019-08-23 DIAGNOSIS — M79675 Pain in left toe(s): Secondary | ICD-10-CM

## 2019-08-23 DIAGNOSIS — L853 Xerosis cutis: Secondary | ICD-10-CM | POA: Diagnosis not present

## 2019-08-23 NOTE — Progress Notes (Signed)
Subjective: Derrick Compton is a 85 y.o. male patient seen today in office with complaint of mildly painful thickened and discolored nails. Patient is desiring treatment for nail changes; has tried OTC topicals/Medication in the past with no improvement. Reports that nails are becoming difficult to manage because of the thickness.  Patient also reports pain in the bottom of the right heel feels like he is walking on a rock when he tries to walk barefoot has been slowly getting worse over the last 9 months.  Patient has no other pedal complaints at this time.   Review of Systems  All other systems reviewed and are negative.    Patient Active Problem List   Diagnosis Date Noted  . Paroxysmal atrial fibrillation ([place]) 08/17/2019  . Coronary artery disease 09/22/2017  . Aortic stenosis, mild 01/16/2017  . Chest pain 10/28/2016  . Colon cancer ([place]) 10/22/2016  . Hyperlipidemia 10/22/2016  . Sensorineural hearing loss (SNHL), bilateral 10/22/2016  . Anxiety 04/23/2016  . BPH (benign prostatic hyperplasia) 04/23/2016  . Hearing difficulty of right ear 04/23/2016  . Uses hearing aid 04/23/2016  . Benign non-nodular prostatic hyperplasia with lower urinary tract symptoms 11/28/2014  . Kidney stone 11/28/2014  . H/O hematuria 11/28/2014  . Recurrent nephrolithiasis 11/28/2014  . Status post corneal transplant 12/08/2011  . Fuchs' corneal dystrophy 05/07/2011  . Status post LASIK surgery 05/07/2011  . UNEQUAL LEG LENGTH 01/10/2010  . MUSCLE STRAIN, LEFT CALF 01/10/2010    Current Outpatient Medications on File Prior to Visit  Medication Sig Dispense Refill  . ALPRAZolam (XANAX) 0.5 MG tablet Take 0.5 mg by mouth at bedtime as needed.     [name] apixaban (ELIQUIS) 5 MG TABS tablet Take 1 tablet (5 mg total) by mouth 2 (two) times daily. 60 tablet 11  . Cholecalciferol (VITAMIN D3) 25 MCG (1000 UT) CAPS Vitamin D3 25 mcg (1,000 unit) capsule  Take 1 capsule every day by oral route.    .  diltiazem (CARDIZEM) 30 MG tablet Take 1 tablet every 4 hours AS NEEDED for AFIB heart rate >100 45 tablet 1  . MAGNESIUM OXIDE PO Take 1 tablet by mouth daily.    . metoprolol tartrate (LOPRESSOR) 50 MG tablet Take 1 tablet by mouth two hours prior to your Cardiac CTA. 1 tablet 0  . mirtazapine (REMERON) 30 MG tablet TAKE 1 TABLET BY MOUTH AT BEDTIME 30 tablet 5  . Omega-3 Fatty Acids (OMEGA-3 FISH OIL PO) Take 1 tablet by mouth daily.    . rosuvastatin (CRESTOR) 40 MG tablet Take by mouth daily.   3  . sertraline (ZOLOFT) 50 MG tablet TAKE 3 TABLETS BY MOUTH EVERY DAY (Patient taking differently: Take 50 mg by mouth daily. ) 270 tablet 1   No current facility-administered medications on file prior to visit.    No Known Allergies  Objective: Physical Exam  General: Well developed, nourished, no acute distress, awake, alert and oriented x 3  Vascular: Dorsalis pedis artery 2/4 bilateral, Posterior tibial artery 1/4 bilateral, skin temperature warm to warm proximal to distal bilateral lower extremities, no varicosities, pedal hair present bilateral.  Neurological: Gross sensation present via light touch bilateral.   Dermatological: Skin is warm, dry, and supple bilateral, Nails 1-10 are tender, short thick, and discolored with mild subungal debris with most involvement of bilateral hallux nails, no webspace macerations present bilateral, no open lesions present bilateral, + callus/corns/hyperkeratotic tissue present plantar heel with nucleated core. No signs of infection bilateral.  Musculoskeletal: Asymptomatic  hammertoe boney deformities noted bilateral. Muscular strength within normal limits without painon range of motion. No pain with calf compression bilateral.  Assessment and Plan:  Problem List Items Addressed This Visit    None    Visit Diagnoses    Nail fungus    -  Primary   Relevant Orders   Culture, fungus without smear   Pain in toes of both feet       Porokeratosis        Dry skin          -Examined patient -Discussed treatment options for painful dystrophic nails especially worse at bilateral hallux -Fungal culture was obtained by removing a portion of the hard nail itself from each of the involved toenails 1 through 10 using a sterile nail nipper and sent to [facility] lab. Patient tolerated the biopsy procedure well without discomfort or need for anesthesia.  -Advised skin emollients and gave sample of foot miracle cream to use as instructed -Mechanically debrided keratotic lesion at the right heel using a sterile chisel blade and then applied Salinocaine and Band-Aid dressing advised patient of likely recurrence and must continue with daily skin emollients to help decrease this porokeratotic lesion from reoccurring -Patient to return in 4 weeks for follow up evaluation and discussion of fungal culture results and callus check or sooner if symptoms worsen.  Landis Martins, DPM

## 2019-08-31 ENCOUNTER — Ambulatory Visit: Payer: Medicare Other | Admitting: Psychiatry

## 2019-09-03 ENCOUNTER — Other Ambulatory Visit: Payer: Self-pay | Admitting: Psychiatry

## 2019-09-05 ENCOUNTER — Other Ambulatory Visit: Payer: Self-pay | Admitting: Cardiovascular Disease

## 2019-09-05 ENCOUNTER — Other Ambulatory Visit: Payer: Medicare Other | Admitting: *Deleted

## 2019-09-05 ENCOUNTER — Other Ambulatory Visit: Payer: Self-pay

## 2019-09-05 LAB — BASIC METABOLIC PANEL
BUN/Creatinine Ratio: 17 (ref 10–24)
BUN: 15 mg/dL (ref 8–27)
CO2: 25 mmol/L (ref 20–29)
Calcium: 9.8 mg/dL (ref 8.6–10.2)
Chloride: 104 mmol/L (ref 96–106)
Creatinine, Ser: 0.89 mg/dL (ref 0.76–1.27)
GFR calc Af Amer: 92 mL/min/{1.73_m2} (ref >59)
GFR calc non Af Amer: 80 mL/min/{1.73_m2} (ref >59)
Glucose: 106 mg/dL — ABNORMAL HIGH (ref 65–99)
Potassium: 4.3 mmol/L (ref 3.5–5.2)
Sodium: 142 mmol/L (ref 134–144)

## 2019-09-09 ENCOUNTER — Encounter (HOSPITAL_COMMUNITY): Payer: Self-pay

## 2019-09-12 ENCOUNTER — Ambulatory Visit (HOSPITAL_COMMUNITY)
Admission: RE | Admit: 2019-09-12 | Discharge: 2019-09-12 | Disposition: A | Discharge status: Home / Self Care | Payer: Medicare Other | Source: Ambulatory Visit | Attending: Cardiovascular Disease | Admitting: Cardiovascular Disease

## 2019-09-12 ENCOUNTER — Other Ambulatory Visit: Payer: Self-pay

## 2019-09-12 DIAGNOSIS — R072 Precordial pain: Secondary | ICD-10-CM

## 2019-09-12 DIAGNOSIS — R0602 Shortness of breath: Secondary | ICD-10-CM

## 2019-09-12 MED ORDER — NITROGLYCERIN 0.4 MG SL SUBL
SUBLINGUAL_TABLET | SUBLINGUAL | Status: AC
  Filled 2019-09-12: qty 2

## 2019-09-12 MED ORDER — NITROGLYCERIN 0.4 MG SL SUBL
0.8000 mg | SUBLINGUAL_TABLET | Freq: Once | SUBLINGUAL | Status: AC
  Administered 2019-09-12: 0.8 mg via SUBLINGUAL

## 2019-09-12 MED ORDER — IOHEXOL 350 MG/ML SOLN
80.0000 mL | Freq: Once | INTRAVENOUS | Status: AC | PRN
  Administered 2019-09-12: 80 mL via INTRAVENOUS

## 2019-09-13 ENCOUNTER — Ambulatory Visit: Payer: Medicare Other | Admitting: Cardiovascular Disease

## 2019-09-19 ENCOUNTER — Other Ambulatory Visit: Payer: Self-pay

## 2019-09-19 ENCOUNTER — Telehealth (INDEPENDENT_AMBULATORY_CARE_PROVIDER_SITE_OTHER): Payer: Medicare Other | Admitting: Internal Medicine

## 2019-09-19 ENCOUNTER — Encounter: Payer: Self-pay | Admitting: Internal Medicine

## 2019-09-19 VITALS — BP 130/71 | HR 56 | Ht 70.0 in | Wt 155.0 lb

## 2019-09-19 DIAGNOSIS — I480 Paroxysmal atrial fibrillation: Secondary | ICD-10-CM | POA: Diagnosis not present

## 2019-09-19 DIAGNOSIS — D6869 Other thrombophilia: Secondary | ICD-10-CM

## 2019-09-19 NOTE — Patient Instructions (Signed)
Medication Instructions:  Your physician recommends that you continue on your current medications as directed. Please refer to the Current Medication list given to you today.  *If you need a refill on your cardiac medications before your next appointment, please call your pharmacy*   Lab Work: None ordered.  If you have labs (blood work) drawn today and your tests are completely normal, you will receive your results only by: [name] MyChart Message (if you have MyChart) OR . A paper copy in the mail If you have any lab test that is abnormal or we need to change your treatment, we will call you to review the results.   Testing/Procedures: None ordered.  Please contact our office at [phone] if you decide to move forward with Dr Jackalyn Lombard recommendations.  Follow-Up: At [facility], you and your health needs are our priority.  As part of our continuing mission to provide you with exceptional heart care, we have created designated Provider Care Teams.  These Care Teams include your primary Cardiologist (physician) and Advanced Practice Providers (APPs -  Physician Assistants and Nurse Practitioners) who all work together to provide you with the care you need, when you need it.  We recommend signing up for the patient portal called "MyChart".  Sign up information is provided on this After Visit Summary.  MyChart is used to connect with patients for Virtual Visits (Telemedicine).  Patients are able to view lab/test results, encounter notes, upcoming appointments, etc.  Non-urgent messages can be sent to your provider as well.   To learn more about what you can do with MyChart, go to [email or web address].    Your next appointment:   As needed with Dr Rayann Heman

## 2019-09-19 NOTE — Progress Notes (Signed)
Electrophysiology TeleHealth Note   Due to national recommendations of social distancing due to [place] 19, Audio/video telehealth visit is felt to be most appropriate for this patient at this time.  See MyChart message from today for patient consent regarding telehealth for Derrick Compton.   Date:  09/19/2019   ID:  Derrick Compton, DOB 1934-10-05, MRN [id number]  Location: home Provider location: [place] Evaluation Performed: New patient consult  PCP:  Alonna Buckler, MD  Cardiologist:  Quay Burow, MD Electrophysiologist:  None   Chief Complaint:  afib  History of Present Illness:    Derrick Compton is a 85 y.o. male who presents via audio/video conferencing for a telehealth visit today.   The patient is referred for new consultation regarding afib by Dr Gwenlyn Found and [name] NP with the AF clinic. He was initially diagnosed with afib 05/2019 on ZIo monitor for palpitations.  During afib, he has symptoms of palpitations/ tachycardia.  Overall burden is low.  He is very active for his age but feels that he has chest pain when he exercises while in afib.  He is anticoagulated with eliquis.  [place]   Today, he denies symptoms of shortness of breath, orthopnea, PND, lower extremity edema, claudication, dizziness, presyncope, syncope, bleeding, or neurologic sequela. The patient is tolerating medications without difficulties and is otherwise without complaint today.     Past Medical History:  Diagnosis Date  . Anxiety   . BPH (benign prostatic hyperplasia)   . CAD (coronary artery disease)   . Chest pain   . Hyperlipidemia   . Osteopenia   . Paroxysmal atrial fibrillation [facility])     Past Surgical History:  Procedure Laterality Date  . LEFT HEART CATH AND CORONARY ANGIOGRAPHY N/A 02/05/2017   Procedure: LEFT HEART CATH AND CORONARY ANGIOGRAPHY;  Surgeon: Lorretta Harp, MD;  Location: [facility];  Service:  Cardiovascular;  Laterality: N/A;    Current Outpatient Medications  Medication Sig Dispense Refill  . ALPRAZolam (XANAX) 0.5 MG tablet Take 0.5 mg by mouth at bedtime as needed.     [name] apixaban (ELIQUIS) 5 MG TABS tablet Take 1 tablet (5 mg total) by mouth 2 (two) times daily. 60 tablet 11  . Cholecalciferol (VITAMIN D3) 25 MCG (1000 UT) CAPS Vitamin D3 25 mcg (1,000 unit) capsule  Take 1 capsule every day by oral route.    . diltiazem (CARDIZEM) 30 MG tablet Take 1 tablet every 4 hours AS NEEDED for AFIB heart rate >100 45 tablet 1  . MAGNESIUM OXIDE PO Take 1 tablet by mouth daily.    . metoprolol tartrate (LOPRESSOR) 50 MG tablet Take 1 tablet by mouth two hours prior to your Cardiac CTA. 1 tablet 0  . mirtazapine (REMERON) 30 MG tablet TAKE 1 TABLET BY MOUTH AT BEDTIME 30 tablet 1  . Omega-3 Fatty Acids (OMEGA-3 FISH OIL PO) Take 1 tablet by mouth daily.    . rosuvastatin (CRESTOR) 40 MG tablet Take by mouth daily.   3  . sertraline (ZOLOFT) 50 MG tablet TAKE 3 TABLETS BY MOUTH EVERY DAY (Patient taking differently: Take 50 mg by mouth daily. ) 270 tablet 1   No current facility-administered medications for this visit.    Allergies:   Patient has no known allergies.   Social History:  The patient  reports that he has never smoked. He has never used smokeless tobacco. He reports that he does  not drink alcohol or use drugs.   Family History:  + HTN   ROS:  Please see the history of present illness.   All other systems are personally reviewed and negative.    Exam:    Vital Signs:  BP 130/71   Pulse (!) 56   Ht 5\' 10"  (1.778 m)   Wt 155 lb (70.3 kg)   BMI 22.24 kg/m   Well appearing, alert and conversant, regular work of breathing,  good skin color Eyes- anicteric, neuro- grossly intact, skin- no apparent rash or lesions or cyanosis, mouth- oral mucosa is pink   Labs/Other Tests and Data Reviewed:    Recent Labs: 06/15/2019: Hemoglobin 14.7; Magnesium 2.3; Platelets 178;  TSH 2.200 09/05/2019: BUN 15; Creatinine, Ser 0.89; Potassium 4.3; Sodium 142   Wt Readings from Last 3 Encounters:  09/19/19 155 lb (70.3 kg)  08/17/19 149 lb (67.6 kg)  08/01/19 159 lb 12.8 oz (72.5 kg)    ekg 08/01/19- sinus bradycardia, LAHB   Other studies personally reviewed: Additional studies/ records that were reviewed today include: AF clinic notes, prior echo, recent cardiac CT  Review of the above records today demonstrates:  As above   ASSESSMENT & PLAN:    1.  paroxysmal atrial fibrillation The patient has symptomatic, recurrent paroxysmal atrial fibrillation. Medical therapy is limted by bradycardia Chads2vasc score is 3.  he is anticoagulated with eliquis . Therapeutic strategies for afib including medicine and ablation were discussed in detail with the patient today. Risk, benefits, and alternatives to EP study and radiofrequency ablation for afib were also discussed in detail today. These risks include but are not limited to stroke, bleeding, vascular damage, tamponade, perforation, damage to the esophagus, lungs, and other structures, pulmonary vein stenosis, worsening renal function, and death. The patient understands these risk and wishes to think about this further.  He will discuss with Dr Gwenlyn Found.  We also need to make sure that Dr Gwenlyn Found does not anticipate further revascularization prior to the procedure.  He will contact my office if he decides to proceed. We would require Carto/ICE/ anesthesia for the procedure. Given recent cardiac CT, I would not repeat CT.    Patient Risk:  after full review of this patients clinical status, I feel that they are at high risk at this time.   Today, I have spent 20 minutes with the patient with telehealth technology discussing afib .    Signed, [name] MD, [place] 09/19/2019 11:57 AM   [facility] [address] [place] [address] [phone] (office) [phone] (fax)

## 2019-09-20 ENCOUNTER — Ambulatory Visit: Payer: Medicare Other | Admitting: Sports Medicine

## 2019-09-27 ENCOUNTER — Telehealth: Payer: Self-pay

## 2019-09-27 ENCOUNTER — Ambulatory Visit (INDEPENDENT_AMBULATORY_CARE_PROVIDER_SITE_OTHER): Payer: Medicare Other | Admitting: Cardiovascular Disease

## 2019-09-27 ENCOUNTER — Other Ambulatory Visit: Payer: Self-pay

## 2019-09-27 ENCOUNTER — Encounter: Payer: Self-pay | Admitting: Cardiovascular Disease

## 2019-09-27 DIAGNOSIS — I2510 Atherosclerotic heart disease of native coronary artery without angina pectoris: Secondary | ICD-10-CM | POA: Diagnosis not present

## 2019-09-27 DIAGNOSIS — I480 Paroxysmal atrial fibrillation: Secondary | ICD-10-CM

## 2019-09-27 DIAGNOSIS — I350 Nonrheumatic aortic (valve) stenosis: Secondary | ICD-10-CM | POA: Diagnosis not present

## 2019-09-27 DIAGNOSIS — I25118 Atherosclerotic heart disease of native coronary artery with other forms of angina pectoris: Secondary | ICD-10-CM | POA: Diagnosis not present

## 2019-09-27 DIAGNOSIS — E782 Mixed hyperlipidemia: Secondary | ICD-10-CM | POA: Diagnosis not present

## 2019-09-27 NOTE — Assessment & Plan Note (Signed)
History of CAD status post cardiac catheterization by myself 02/05/2017 revealing a high-grade ostial first diagonal branch stenosis which came off in a 90 degree angle making PCI somewhat high risk.  He had no other significant CAD.  He gets occasional chest pain when he becomes tachycardic during A. fib but otherwise is relatively asymptomatic.  Recent coronary CTA performed 09/12/2019 revealed coronary calcium score of 217 without significant CAD.

## 2019-09-27 NOTE — Assessment & Plan Note (Signed)
History of hyperlipidemia on statin therapy followed by his PCP 

## 2019-09-27 NOTE — Assessment & Plan Note (Signed)
History of mild aortic stenosis by recent 2D echo performed 07/19/2019 revealing a aortic valve area of 1.85 cm with a peak gradient of 14 mmHg.  We will follow this on annual basis.

## 2019-09-27 NOTE — Assessment & Plan Note (Signed)
History of PAF which happen several times a week with heart rates up to 150.  During these episodes he does get chest pain.  He is on Eliquis oral anticoagulation.  He saw Dr. Rayann Heman recently on 09/19/2019 who discussed A. fib ablation which the patient now wishes to pursue.

## 2019-09-27 NOTE — Progress Notes (Signed)
09/27/2019 Derrick Compton   1934-08-19  [id number]  Primary Physician Derrick Buckler, MD Primary Cardiologist: Derrick Harp MD Derrick Compton  HPI:  Derrick Compton is a 85 y.o.  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 He was referred by the [facility] clinic for cardiovascular evaluation because of proximity and progressive chest pain. I last saw him in the office 08/17/2019. His only cardiovascular risk factor is hyperlipidemia. He did have a coronary calcium score performed 10/04/15 which was 181 a subsequent GXT which was normal. He exercises frequently has noticed progressive substernal chest pain with running. I perform Myoview stress testing on 11/11/16 which was entirely normal 2-D echo performed 12/09/16 showed mild aortic stenosis. He studies continued symptoms I ordered a coronary CTA/FFR on 12/24/16 showed a significant lesion in a diagonal branch.  He underwent cardiac catheterization by myself 02/05/17 revealing a 90% ostial high first diagonal branch stenosis which came off orthogonal to the LAD making percutaneous intervention high risk. I do not think that the risk of intervention would be worth potential benefit of revascularization. The patient, because of his relative hypotension and bradycardia is not a good candidate for anginal medications.   He has developed A. fib seen on an event monitor 06/23/2019.  He did see Dr. Rayann Compton in the clinic for evaluation of this and was placed on Eliquis.  Over the last several months he is noticed increasing dyspnea on exertion as well as substernal chest pressure on exertion .  He also gets episodes related to fib several times a week with heart rates up to 150 which he can tell on his apple watch.  Due to these episodes he does get some substernal chest pain.  Recent coronary CTA performed 09/12/2019  revealed a coronary calcium score of 217 without significant CAD.  He saw Dr. Rayann Compton in the office 09/19/2019 2 again discussed A. fib ablation which now he wishes to pursue.    Current Meds  Medication Sig  . ALPRAZolam (XANAX) 0.5 MG tablet Take 0.5 mg by mouth at bedtime as needed.   [name] apixaban (ELIQUIS) 5 MG TABS tablet Take 1 tablet (5 mg total) by mouth 2 (two) times daily.  . Cholecalciferol (VITAMIN D3) 25 MCG (1000 UT) CAPS Vitamin D3 25 mcg (1,000 unit) capsule  Take 1 capsule every day by oral route.  . diltiazem (CARDIZEM) 30 MG tablet Take 1 tablet every 4 hours AS NEEDED for AFIB heart rate >100  . MAGNESIUM OXIDE PO Take 1 tablet by mouth daily.  . metoprolol tartrate (LOPRESSOR) 50 MG tablet Take 1 tablet by mouth two hours prior to your Cardiac CTA.  . mirtazapine (REMERON) 30 MG tablet TAKE 1 TABLET BY MOUTH AT BEDTIME  . Omega-3 Fatty Acids (OMEGA-3 FISH OIL PO) Take 1 tablet by mouth daily.  . rosuvastatin (CRESTOR) 40 MG tablet Take by mouth daily.   . sertraline (ZOLOFT) 50 MG tablet TAKE 3 TABLETS BY MOUTH EVERY DAY (Patient taking differently: Take 50 mg by mouth daily. )     No Known Allergies  Social History   Socioeconomic History  . Marital status: Married    Spouse name: Not on file  . Number of children: Not on file  . Years of education: Not on file  . Highest education level: Not on file  Occupational History  . Not on file  Tobacco  Use  . Smoking status: Never Smoker  . Smokeless tobacco: Never Used  Substance and Sexual Activity  . Alcohol use: No  . Drug use: No  . Sexual activity: Not on file  Other Topics Concern  . Not on file  Social History Narrative   Lives in [place] with wife.      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   Social Determinants of Health   Financial Resource Strain:   . Difficulty of Paying Living Expenses:   Food Insecurity:   . Worried About Charity fundraiser in the Last Year:     . Arboriculturist in the Last Year:   Transportation Needs:   . Film/video editor (Medical):   [name] Lack of Transportation (Non-Medical):   Physical Activity:   . Days of Exercise per Week:   . Minutes of Exercise per Session:   Stress:   . Feeling of Stress :   Social Connections:   . Frequency of Communication with Friends and Family:   . Frequency of Social Gatherings with Friends and Family:   . Attends Religious Services:   . Active Member of Clubs or Organizations:   . Attends Archivist Meetings:   [name] Marital Status:   Intimate Partner Violence:   . Fear of Current or Ex-Partner:   . Emotionally Abused:   [name] Physically Abused:   . Sexually Abused:      Review of Systems: General: negative for chills, fever, night sweats or weight changes.  Cardiovascular: negative for chest pain, dyspnea on exertion, edema, orthopnea, palpitations, paroxysmal nocturnal dyspnea or shortness of breath Dermatological: negative for rash Respiratory: negative for cough or wheezing Urologic: negative for hematuria Abdominal: negative for nausea, vomiting, diarrhea, bright red blood per rectum, melena, or hematemesis Neurologic: negative for visual changes, syncope, or dizziness All other systems reviewed and are otherwise negative except as noted above.    Blood pressure 104/68, pulse (!) 56, height 5' 10.5" (1.791 m), weight 157 lb 12.8 oz (71.6 kg), SpO2 95 %.  General appearance: alert and no distress Neck: no adenopathy, no carotid bruit, no JVD, supple, symmetrical, trachea midline and thyroid not enlarged, symmetric, no tenderness/mass/nodules Lungs: clear to auscultation bilaterally Heart: regular rate and rhythm, S1, S2 normal, no murmur, click, rub or gallop Extremities: extremities normal, atraumatic, no cyanosis or edema Pulses: 2+ and symmetric Skin: Skin color, texture, turgor normal. No rashes or lesions Neurologic: Alert and oriented X 3, normal strength and  tone. Normal symmetric reflexes. Normal coordination and gait  EKG not performed today  ASSESSMENT AND PLAN:   Hyperlipidemia History of hyperlipidemia on statin therapy followed by his PCP  Aortic stenosis, mild History of mild aortic stenosis by recent 2D echo performed 07/19/2019 revealing a aortic valve area of 1.85 cm with a peak gradient of 14 mmHg.  We will follow this on annual basis.  Coronary artery disease History of CAD status post cardiac catheterization by myself 02/05/2017 revealing a high-grade ostial first diagonal branch stenosis which came off in a 90 degree angle making PCI somewhat high risk.  He had no other significant CAD.  He gets occasional chest pain when he becomes tachycardic during A. fib but otherwise is relatively asymptomatic.  Recent coronary CTA performed 09/12/2019 revealed coronary calcium score of 217 without significant CAD.  Paroxysmal atrial fibrillation ([facility]) History of PAF which happen several times a week with heart rates up to 150.  During these  episodes he does get chest pain.  He is on Eliquis oral anticoagulation.  He saw Dr. Rayann Compton recently on 09/19/2019 who discussed A. fib ablation which the patient now wishes to pursue.      Derrick Harp MD [place], [facility] 09/27/2019 10:23 AM

## 2019-09-27 NOTE — Telephone Encounter (Signed)
Per Dr. Hosie Poisson does not need repeat CT prior to ablation.  Spoke with Pt.  He states he does not need another appointment with Dr. Verlin Fester would like to go ahead and schedule procedure.  Advised would send him upcoming dates in June for procedures.  He will review and this nurse will call him tomorrow to schedule.

## 2019-09-27 NOTE — Patient Instructions (Signed)
Medication Instructions:  NO CHANGE *If you need a refill on your cardiac medications before your next appointment, please call your pharmacy*   Lab Work: If you have labs (blood work) drawn today and your tests are completely normal, you will receive your results only by: . MyChart Message (if you have MyChart) OR . A paper copy in the mail If you have any lab test that is abnormal or we need to change your treatment, we will call you to review the results   Follow-Up: At [facility], you and your health needs are our priority.  As part of our continuing mission to provide you with exceptional heart care, we have created designated Provider Care Teams.  These Care Teams include your primary Cardiologist (physician) and Advanced Practice Providers (APPs -  Physician Assistants and Nurse Practitioners) who all work together to provide you with the care you need, when you need it.  We recommend signing up for the patient portal called "MyChart".  Sign up information is provided on this After Visit Summary.  MyChart is used to connect with patients for Virtual Visits (Telemedicine).  Patients are able to view lab/test results, encounter notes, upcoming appointments, etc.  Non-urgent messages can be sent to your provider as well.   To learn more about what you can do with MyChart, go to [email or web address].    Your next appointment:   3 month(s)  The format for your next appointment:   In Person  Provider:   JONATHAN BERRY MD    

## 2019-09-28 NOTE — Telephone Encounter (Signed)
November 10, 2019

## 2019-09-29 ENCOUNTER — Other Ambulatory Visit: Payer: Self-pay

## 2019-09-29 DIAGNOSIS — Z01810 Encounter for preprocedural cardiovascular examination: Secondary | ICD-10-CM

## 2019-09-29 DIAGNOSIS — Z79899 Other long term (current) drug therapy: Secondary | ICD-10-CM

## 2019-09-29 DIAGNOSIS — I25118 Atherosclerotic heart disease of native coronary artery with other forms of angina pectoris: Secondary | ICD-10-CM

## 2019-09-29 DIAGNOSIS — E782 Mixed hyperlipidemia: Secondary | ICD-10-CM

## 2019-09-29 DIAGNOSIS — I480 Paroxysmal atrial fibrillation: Secondary | ICD-10-CM

## 2019-09-29 NOTE — Telephone Encounter (Addendum)
Pt agreed to Afib Ablation for 11/10/19 at 7:30 am with 5:30 am arrival. According to notes pt does not need CT prior.   Pt verbalized understanding of all of his procedure instructions.   Labs and COVID testing 11/07/19.

## 2019-10-04 ENCOUNTER — Ambulatory Visit (INDEPENDENT_AMBULATORY_CARE_PROVIDER_SITE_OTHER): Payer: Medicare Other | Admitting: Sports Medicine

## 2019-10-04 ENCOUNTER — Encounter: Payer: Self-pay | Admitting: Sports Medicine

## 2019-10-04 ENCOUNTER — Other Ambulatory Visit: Payer: Self-pay

## 2019-10-04 VITALS — Temp 98.1°F

## 2019-10-04 DIAGNOSIS — M79674 Pain in right toe(s): Secondary | ICD-10-CM

## 2019-10-04 DIAGNOSIS — I2510 Atherosclerotic heart disease of native coronary artery without angina pectoris: Secondary | ICD-10-CM | POA: Diagnosis not present

## 2019-10-04 DIAGNOSIS — B351 Tinea unguium: Secondary | ICD-10-CM | POA: Diagnosis not present

## 2019-10-04 DIAGNOSIS — M79675 Pain in left toe(s): Secondary | ICD-10-CM

## 2019-10-04 NOTE — Progress Notes (Signed)
Subjective: Derrick Compton is a 85 y.o. male patient seen today in office for fungal culture results. Patient has no other pedal complaints at this time.   Patient Active Problem List   Diagnosis Date Noted  . Paroxysmal atrial fibrillation ([place]) 08/17/2019  . Coronary artery disease 09/22/2017  . Aortic stenosis, mild 01/16/2017  . Chest pain 10/28/2016  . Colon cancer ([place]) 10/22/2016  . Hyperlipidemia 10/22/2016  . Sensorineural hearing loss (SNHL), bilateral 10/22/2016  . Anxiety 04/23/2016  . BPH (benign prostatic hyperplasia) 04/23/2016  . Hearing difficulty of right ear 04/23/2016  . Uses hearing aid 04/23/2016  . Benign non-nodular prostatic hyperplasia with lower urinary tract symptoms 11/28/2014  . Kidney stone 11/28/2014  . H/O hematuria 11/28/2014  . Recurrent nephrolithiasis 11/28/2014  . Status post corneal transplant 12/08/2011  . Fuchs' corneal dystrophy 05/07/2011  . Status post LASIK surgery 05/07/2011  . UNEQUAL LEG LENGTH 01/10/2010  . MUSCLE STRAIN, LEFT CALF 01/10/2010    Current Outpatient Medications on File Prior to Visit  Medication Sig Dispense Refill  . ALPRAZolam (XANAX) 0.5 MG tablet Take 0.5 mg by mouth at bedtime as needed.     [name] apixaban (ELIQUIS) 5 MG TABS tablet Take 1 tablet (5 mg total) by mouth 2 (two) times daily. 60 tablet 11  . Cholecalciferol (VITAMIN D3) 25 MCG (1000 UT) CAPS Vitamin D3 25 mcg (1,000 unit) capsule  Take 1 capsule every day by oral route.    . diltiazem (CARDIZEM) 30 MG tablet Take 1 tablet every 4 hours AS NEEDED for AFIB heart rate >100 45 tablet 1  . MAGNESIUM OXIDE PO Take 1 tablet by mouth daily.    . metoprolol tartrate (LOPRESSOR) 50 MG tablet Take 1 tablet by mouth two hours prior to your Cardiac CTA. 1 tablet 0  . mirtazapine (REMERON) 30 MG tablet TAKE 1 TABLET BY MOUTH AT BEDTIME 30 tablet 1  . Omega-3 Fatty Acids (OMEGA-3 FISH OIL PO) Take 1 tablet by mouth daily.    . rosuvastatin (CRESTOR) 40 MG tablet  Take by mouth daily.   3  . sertraline (ZOLOFT) 50 MG tablet TAKE 3 TABLETS BY MOUTH EVERY DAY (Patient taking differently: Take 50 mg by mouth daily. ) 270 tablet 1   No current facility-administered medications on file prior to visit.    No Known Allergies  Objective: Physical Exam  General: Well developed, nourished, no acute distress, awake, alert and oriented x 3  Vascular: Dorsalis pedis artery 2/4 bilateral, Posterior tibial artery 2/4 bilateral, skin temperature warm to warm proximal to distal bilateral lower extremities, no varicosities, pedal hair present bilateral.  Neurological: Gross sensation present via light touch bilateral.   Dermatological: Skin is warm, dry, and supple bilateral, Nails 1-10 are tender, short thick, and discolored with mild subungal debris, no webspace macerations present bilateral, no open lesions present bilateral, Minimal callus tissue present bilateral heels. No signs of infection bilateral.  Musculoskeletal: Asymptomatic hammertoe boney deformities noted bilateral. Muscular strength within normal limits without painon range of motion. No pain with calf compression bilateral.  Fungal culture + T rubrum  Assessment and Plan:  Problem List Items Addressed This Visit    None    Visit Diagnoses    Nail fungus    -  Primary   Pain in toes of both feet          -Examined patient -Discussed treatment options for painful mycotic nails -Patient wants to think about treatment options 1) PO Lamisil, 2)  Laser, 3)Topical Antifungal compound; Patient will call office once he has decided which treatment options he wants to try -Advised good hygiene habits meanwhile -Patient to return in 6 weeks for follow up evaluation or sooner if symptoms worsen.  Landis Martins, DPM

## 2019-11-03 ENCOUNTER — Other Ambulatory Visit: Payer: Self-pay | Admitting: Psychiatry

## 2019-11-07 ENCOUNTER — Other Ambulatory Visit: Payer: Self-pay

## 2019-11-07 ENCOUNTER — Other Ambulatory Visit: Payer: Medicare Other | Admitting: *Deleted

## 2019-11-07 ENCOUNTER — Other Ambulatory Visit (HOSPITAL_COMMUNITY)
Admission: RE | Admit: 2019-11-07 | Discharge: 2019-11-07 | Disposition: A | Discharge status: Home / Self Care | Payer: Medicare Other | Source: Ambulatory Visit | Attending: Internal Medicine | Admitting: Internal Medicine

## 2019-11-07 DIAGNOSIS — I25118 Atherosclerotic heart disease of native coronary artery with other forms of angina pectoris: Secondary | ICD-10-CM

## 2019-11-07 DIAGNOSIS — Z01812 Encounter for preprocedural laboratory examination: Secondary | ICD-10-CM | POA: Insufficient documentation

## 2019-11-07 DIAGNOSIS — Z20822 Contact with and (suspected) exposure to covid-19: Secondary | ICD-10-CM | POA: Diagnosis not present

## 2019-11-07 DIAGNOSIS — E782 Mixed hyperlipidemia: Secondary | ICD-10-CM

## 2019-11-07 DIAGNOSIS — Z79899 Other long term (current) drug therapy: Secondary | ICD-10-CM

## 2019-11-07 DIAGNOSIS — Z01810 Encounter for preprocedural cardiovascular examination: Secondary | ICD-10-CM

## 2019-11-07 DIAGNOSIS — I480 Paroxysmal atrial fibrillation: Secondary | ICD-10-CM

## 2019-11-07 LAB — CBC WITH DIFFERENTIAL/PLATELET
Basophils Absolute: 0 10*3/uL (ref 0.0–0.2)
Basos: 1 %
EOS (ABSOLUTE): 0.2 10*3/uL (ref 0.0–0.4)
Eos: 3 %
Hematocrit: 39.8 % (ref 37.5–51.0)
Hemoglobin: 13.4 g/dL (ref 13.0–17.7)
Lymphocytes Absolute: 1.2 10*3/uL (ref 0.7–3.1)
Lymphs: 20 %
MCH: 30.7 pg (ref 26.6–33.0)
MCHC: 33.7 g/dL (ref 31.5–35.7)
MCV: 91 fL (ref 79–97)
Monocytes Absolute: 0.5 10*3/uL (ref 0.1–0.9)
Monocytes: 8 %
Neutrophils Absolute: 4.1 10*3/uL (ref 1.4–7.0)
Neutrophils: 68 %
Platelets: 237 10*3/uL (ref 150–450)
RBC: 4.36 x10E6/uL (ref 4.14–5.80)
RDW: 14.3 % (ref 11.6–15.4)
WBC: 6 10*3/uL (ref 3.4–10.8)

## 2019-11-07 LAB — BASIC METABOLIC PANEL
BUN/Creatinine Ratio: 18 (ref 10–24)
BUN: 16 mg/dL (ref 8–27)
CO2: 29 mmol/L (ref 20–29)
Calcium: 9.4 mg/dL (ref 8.6–10.2)
Chloride: 104 mmol/L (ref 96–106)
Creatinine, Ser: 0.88 mg/dL (ref 0.76–1.27)
GFR calc Af Amer: 92 mL/min/{1.73_m2} (ref >59)
GFR calc non Af Amer: 80 mL/min/{1.73_m2} (ref >59)
Glucose: 136 mg/dL — ABNORMAL HIGH (ref 65–99)
Potassium: 4.1 mmol/L (ref 3.5–5.2)
Sodium: 140 mmol/L (ref 134–144)

## 2019-11-07 LAB — SARS CORONAVIRUS 2 (TAT 6-24 HRS): SARS Coronavirus 2: NEGATIVE

## 2019-11-09 NOTE — Anesthesia Preprocedure Evaluation (Addendum)
Anesthesia Evaluation  Patient identified by MRN, date of birth, ID band Patient awake    Reviewed: Allergy & Precautions, NPO status , Patient's Chart, lab work & pertinent test results, reviewed documented beta blocker date and time   Airway Mallampati: II  TM Distance: >3 FB Neck ROM: Full    Dental  (+) Teeth Intact, Dental Advisory Given, Caps,    Pulmonary neg pulmonary ROS,    Pulmonary exam normal breath sounds clear to auscultation       Cardiovascular hypertension, Pt. on medications and Pt. on home beta blockers + CAD  + dysrhythmias Atrial Fibrillation  Rhythm:Irregular Rate:Abnormal     Neuro/Psych PSYCHIATRIC DISORDERS Anxiety negative neurological ROS     GI/Hepatic negative GI ROS, Neg liver ROS,   Endo/Other  negative endocrine ROS  Renal/GU negative Renal ROS     Musculoskeletal negative musculoskeletal ROS (+)   Abdominal   Peds  Hematology  (+) Blood dyscrasia (Eliquis), ,   Anesthesia Other Findings   Reproductive/Obstetrics                            Anesthesia Physical Anesthesia Plan  ASA: III  Anesthesia Plan: General   Post-op Pain Management:    Induction: Intravenous  PONV Risk Score and Plan: 2 and Dexamethasone and Ondansetron  Airway Management Planned: Oral ETT  Additional Equipment:   Intra-op Plan:   Post-operative Plan: Extubation in OR  Informed Consent: I have reviewed the patients History and Physical, chart, labs and discussed the procedure including the risks, benefits and alternatives for the proposed anesthesia with the patient or authorized representative who has indicated his/her understanding and acceptance.     Dental advisory given  Plan Discussed with: CRNA  Anesthesia Plan Comments:        Anesthesia Quick Evaluation

## 2019-11-10 ENCOUNTER — Encounter (HOSPITAL_COMMUNITY)
Admission: RE | Disposition: A | Discharge status: Home / Self Care | Payer: Self-pay | Source: Ambulatory Visit | Attending: Internal Medicine

## 2019-11-10 ENCOUNTER — Other Ambulatory Visit: Payer: Self-pay

## 2019-11-10 ENCOUNTER — Ambulatory Visit (HOSPITAL_COMMUNITY)
Admission: RE | Admit: 2019-11-10 | Discharge: 2019-11-10 | Disposition: A | Discharge status: Home / Self Care | Payer: Medicare Other | Source: Ambulatory Visit | Attending: Internal Medicine | Admitting: Internal Medicine

## 2019-11-10 ENCOUNTER — Ambulatory Visit (HOSPITAL_COMMUNITY): Payer: Medicare Other | Admitting: Certified Registered Nurse Anesthetist

## 2019-11-10 DIAGNOSIS — I480 Paroxysmal atrial fibrillation: Secondary | ICD-10-CM | POA: Diagnosis present

## 2019-11-10 DIAGNOSIS — Z7901 Long term (current) use of anticoagulants: Secondary | ICD-10-CM | POA: Diagnosis not present

## 2019-11-10 DIAGNOSIS — F419 Anxiety disorder, unspecified: Secondary | ICD-10-CM | POA: Diagnosis not present

## 2019-11-10 DIAGNOSIS — N400 Enlarged prostate without lower urinary tract symptoms: Secondary | ICD-10-CM | POA: Diagnosis not present

## 2019-11-10 DIAGNOSIS — Z79899 Other long term (current) drug therapy: Secondary | ICD-10-CM | POA: Diagnosis not present

## 2019-11-10 DIAGNOSIS — R001 Bradycardia, unspecified: Secondary | ICD-10-CM | POA: Insufficient documentation

## 2019-11-10 DIAGNOSIS — I2510 Atherosclerotic heart disease of native coronary artery without angina pectoris: Secondary | ICD-10-CM | POA: Insufficient documentation

## 2019-11-10 DIAGNOSIS — E785 Hyperlipidemia, unspecified: Secondary | ICD-10-CM | POA: Diagnosis not present

## 2019-11-10 HISTORY — PX: ATRIAL FIBRILLATION ABLATION: EP1191

## 2019-11-10 LAB — POCT ACTIVATED CLOTTING TIME: Activated Clotting Time: 252 seconds

## 2019-11-10 SURGERY — ATRIAL FIBRILLATION ABLATION
Anesthesia: General

## 2019-11-10 MED ORDER — SUGAMMADEX SODIUM 200 MG/2ML IV SOLN
INTRAVENOUS | Status: DC | PRN
  Administered 2019-11-10: 200 mg via INTRAVENOUS

## 2019-11-10 MED ORDER — ACETAMINOPHEN 500 MG PO TABS
1000.0000 mg | ORAL_TABLET | Freq: Once | ORAL | Status: AC
  Filled 2019-11-10: qty 2

## 2019-11-10 MED ORDER — ONDANSETRON HCL 4 MG/2ML IJ SOLN
INTRAMUSCULAR | Status: DC | PRN
  Administered 2019-11-10: 4 mg via INTRAVENOUS

## 2019-11-10 MED ORDER — ISOPROTERENOL HCL 0.2 MG/ML IJ SOLN
INTRAVENOUS | Status: DC | PRN
  Administered 2019-11-10: 20 ug/min via INTRAVENOUS

## 2019-11-10 MED ORDER — LIDOCAINE 2% (20 MG/ML) 5 ML SYRINGE
INTRAMUSCULAR | Status: DC | PRN
  Administered 2019-11-10: 20 mg via INTRAVENOUS
  Administered 2019-11-10: 80 mg via INTRAVENOUS

## 2019-11-10 MED ORDER — PROPOFOL 10 MG/ML IV BOLUS
INTRAVENOUS | Status: DC | PRN
  Administered 2019-11-10 (×2): 50 mg via INTRAVENOUS

## 2019-11-10 MED ORDER — HEPARIN SODIUM (PORCINE) 1000 UNIT/ML IJ SOLN
INTRAMUSCULAR | Status: AC
  Filled 2019-11-10: qty 1

## 2019-11-10 MED ORDER — PANTOPRAZOLE SODIUM 40 MG PO TBEC
40.0000 mg | DELAYED_RELEASE_TABLET | Freq: Every day | ORAL | 0 refills | Status: DC
Start: 2019-11-10 — End: 2019-11-18

## 2019-11-10 MED ORDER — ROCURONIUM BROMIDE 10 MG/ML (PF) SYRINGE
PREFILLED_SYRINGE | INTRAVENOUS | Status: DC | PRN
  Administered 2019-11-10: 30 mg via INTRAVENOUS
  Administered 2019-11-10: 50 mg via INTRAVENOUS

## 2019-11-10 MED ORDER — SODIUM CHLORIDE 0.9 % IV SOLN: 250.0000 mL | INTRAVENOUS | Status: DC | PRN

## 2019-11-10 MED ORDER — HEPARIN SODIUM (PORCINE) 1000 UNIT/ML IJ SOLN
INTRAMUSCULAR | Status: DC | PRN
  Administered 2019-11-10: 5000 [IU] via INTRAVENOUS

## 2019-11-10 MED ORDER — HEPARIN SODIUM (PORCINE) 1000 UNIT/ML IJ SOLN
INTRAMUSCULAR | Status: DC | PRN
  Administered 2019-11-10: 14000 [IU] via INTRAVENOUS

## 2019-11-10 MED ORDER — HEPARIN (PORCINE) IN NACL 1000-0.9 UT/500ML-% IV SOLN
INTRAVENOUS | Status: AC
  Filled 2019-11-10: qty 500

## 2019-11-10 MED ORDER — APIXABAN 5 MG PO TABS
5.0000 mg | ORAL_TABLET | Freq: Once | ORAL | Status: AC
  Administered 2019-11-10: 5 mg via ORAL
  Filled 2019-11-10: qty 1

## 2019-11-10 MED ORDER — ISOPROTERENOL HCL 0.2 MG/ML IJ SOLN
INTRAMUSCULAR | Status: AC
  Filled 2019-11-10: qty 5

## 2019-11-10 MED ORDER — DEXAMETHASONE SODIUM PHOSPHATE 10 MG/ML IJ SOLN
INTRAMUSCULAR | Status: DC | PRN
  Administered 2019-11-10: 5 mg via INTRAVENOUS

## 2019-11-10 MED ORDER — SODIUM CHLORIDE 0.9% FLUSH: 3.0000 mL | INTRAVENOUS | Status: DC | PRN

## 2019-11-10 MED ORDER — PHENYLEPHRINE 40 MCG/ML (10ML) SYRINGE FOR IV PUSH (FOR BLOOD PRESSURE SUPPORT)
PREFILLED_SYRINGE | INTRAVENOUS | Status: DC | PRN
  Administered 2019-11-10: 120 ug via INTRAVENOUS
  Administered 2019-11-10: 80 ug via INTRAVENOUS
  Administered 2019-11-10: 120 ug via INTRAVENOUS

## 2019-11-10 MED ORDER — FENTANYL CITRATE (PF) 250 MCG/5ML IJ SOLN
INTRAMUSCULAR | Status: DC | PRN
  Administered 2019-11-10: 50 ug via INTRAVENOUS

## 2019-11-10 MED ORDER — PHENYLEPHRINE HCL-NACL 10-0.9 MG/250ML-% IV SOLN
INTRAVENOUS | Status: DC | PRN
  Administered 2019-11-10: 30 ug/min via INTRAVENOUS

## 2019-11-10 MED ORDER — ACETAMINOPHEN 500 MG PO TABS
ORAL_TABLET | ORAL | Status: AC
  Administered 2019-11-10: 1000 mg via ORAL
  Filled 2019-11-10: qty 2

## 2019-11-10 MED ORDER — HEPARIN (PORCINE) IN NACL 1000-0.9 UT/500ML-% IV SOLN
INTRAVENOUS | Status: DC | PRN
  Administered 2019-11-10: 500 mL

## 2019-11-10 MED ORDER — PROTAMINE SULFATE 10 MG/ML IV SOLN
INTRAVENOUS | Status: DC | PRN
Start: 2019-11-10 — End: 2019-11-10
  Administered 2019-11-10: 40 mg via INTRAVENOUS

## 2019-11-10 MED ORDER — SODIUM CHLORIDE 0.9% FLUSH: 3.0000 mL | Freq: Two times a day (BID) | INTRAVENOUS | Status: DC

## 2019-11-10 MED ORDER — SODIUM CHLORIDE 0.9 % IV SOLN: INTRAVENOUS | Status: DC

## 2019-11-10 MED ORDER — EPHEDRINE SULFATE-NACL 50-0.9 MG/10ML-% IV SOSY
PREFILLED_SYRINGE | INTRAVENOUS | Status: DC | PRN
  Administered 2019-11-10 (×8): 5 mg via INTRAVENOUS

## 2019-11-10 MED ORDER — HEPARIN SODIUM (PORCINE) 1000 UNIT/ML IJ SOLN
INTRAMUSCULAR | Status: DC | PRN
  Administered 2019-11-10: 1000 [IU] via INTRAVENOUS

## 2019-11-10 SURGICAL SUPPLY — 19 items
BLANKET WARM UNDERBOD FULL ACC (MISCELLANEOUS) ×3 IMPLANT
CATH MAPPNG PENTARAY F 2-6-2MM (CATHETERS) ×1 IMPLANT
CATH SMTCH THERMOCOOL SF FJ (CATHETERS) ×3 IMPLANT
CATH SOUNDSTAR ECO 8FR (CATHETERS) ×3 IMPLANT
CATH WEBSTER BI DIR CS D-F CRV (CATHETERS) ×3 IMPLANT
COVER SWIFTLINK CONNECTOR (BAG) ×3 IMPLANT
DEVICE CLOSURE PERCLS PRGLD 6F (VASCULAR PRODUCTS) ×3 IMPLANT
NEEDLE BAYLIS TRANSSEPTAL 71CM (NEEDLE) ×3 IMPLANT
PACK EP LATEX FREE (CUSTOM PROCEDURE TRAY) ×3
PACK EP LF (CUSTOM PROCEDURE TRAY) ×1 IMPLANT
PAD PRO RADIOLUCENT 2001M-C (PAD) ×3 IMPLANT
PATCH CARTO3 (PAD) ×3 IMPLANT
PENTARAY F 2-6-2MM (CATHETERS) ×3
PERCLOSE PROGLIDE 6F (VASCULAR PRODUCTS) ×9
SHEATH PINNACLE 7F 10CM (SHEATH) ×6 IMPLANT
SHEATH PINNACLE 9F 10CM (SHEATH) ×3 IMPLANT
SHEATH PROBE COVER 6X72 (BAG) ×3 IMPLANT
SHEATH SWARTZ TS SL2 63CM 8.5F (SHEATH) ×3 IMPLANT
TUBING SMART ABLATE COOLFLOW (TUBING) ×3 IMPLANT

## 2019-11-10 NOTE — Anesthesia Procedure Notes (Signed)
Procedure Name: Intubation Performed by: Milford Cage, CRNA Pre-anesthesia Checklist: Patient identified, Emergency Drugs available, Suction available and Patient being monitored Patient Re-evaluated:Patient Re-evaluated prior to induction Oxygen Delivery Method: Circle System Utilized Preoxygenation: Pre-oxygenation with 100% oxygen Induction Type: IV induction Ventilation: Mask ventilation without difficulty Laryngoscope Size: Glidescope and 4 Grade View: Grade I Tube type: Oral Tube size: 7.0 mm Number of attempts: 2 Airway Equipment and Method: Stylet Placement Confirmation: ETT inserted through vocal cords under direct vision,  positive ETCO2 and breath sounds checked- equal and bilateral Secured at: 23 cm Tube secured with: Tape Dental Injury: Teeth and Oropharynx as per pre-operative assessment  Difficulty Due To: Difficulty was unanticipated and Difficult Airway- due to anterior larynx Future Recommendations: Recommend- induction with short-acting agent, and alternative techniques readily available Comments: 1st Mil2 with G4 view. 2nd Glidescope with G1 view and successful intubation. Recommend GS in future

## 2019-11-10 NOTE — Progress Notes (Addendum)
Up and walked and tolerated well; groin stable, no bleeding or hematoma; Jonni Sanger PA in to see client and ok to d/c home

## 2019-11-10 NOTE — H&P (Signed)
Chief Complaint:  afib  History of Present Illness:    Derrick Compton is a 85 y.o. male who presents today for afib ablation.  He was initially diagnosed with afib 05/2019 on ZIo monitor for palpitations.  During afib, he has symptoms of palpitations/ tachycardia.  Overall burden is low.  He is very active for his age but feels that he has chest pain when he exercises while in afib.  He is anticoagulated with eliquis.  He has been [place]   Today, he denies symptoms of shortness of breath, orthopnea, PND, lower extremity edema, claudication, dizziness, presyncope, syncope, bleeding, or neurologic sequela. The patient is tolerating medications without difficulties and is otherwise without complaint today.         Past Medical History:  Diagnosis Date  . Anxiety   . BPH (benign prostatic hyperplasia)   . CAD (coronary artery disease)   . Chest pain   . Hyperlipidemia   . Osteopenia   . Paroxysmal atrial fibrillation [facility])          Past Surgical History:  Procedure Laterality Date  . LEFT HEART CATH AND CORONARY ANGIOGRAPHY N/A 02/05/2017   Procedure: LEFT HEART CATH AND CORONARY ANGIOGRAPHY;  Surgeon: Lorretta Harp, MD;  Location: [place] [facility];  Service: Cardiovascular;  Laterality: N/A;          Current Outpatient Medications  Medication Sig Dispense Refill  . ALPRAZolam (XANAX) 0.5 MG tablet Take 0.5 mg by mouth at bedtime as needed.     [name] apixaban (ELIQUIS) 5 MG TABS tablet Take 1 tablet (5 mg total) by mouth 2 (two) times daily. 60 tablet 11  . Cholecalciferol (VITAMIN D3) 25 MCG (1000 UT) CAPS Vitamin D3 25 mcg (1,000 unit) capsule  Take 1 capsule every day by oral route.    . diltiazem (CARDIZEM) 30 MG tablet Take 1 tablet every 4 hours AS NEEDED for AFIB heart rate >100 45 tablet 1  . MAGNESIUM OXIDE PO Take 1 tablet by mouth daily.    . metoprolol tartrate (LOPRESSOR) 50 MG tablet Take 1 tablet by mouth two  hours prior to your Cardiac CTA. 1 tablet 0  . mirtazapine (REMERON) 30 MG tablet TAKE 1 TABLET BY MOUTH AT BEDTIME 30 tablet 1  . Omega-3 Fatty Acids (OMEGA-3 FISH OIL PO) Take 1 tablet by mouth daily.    . rosuvastatin (CRESTOR) 40 MG tablet Take by mouth daily.   3  . sertraline (ZOLOFT) 50 MG tablet TAKE 3 TABLETS BY MOUTH EVERY DAY (Patient taking differently: Take 50 mg by mouth daily. ) 270 tablet 1   No current facility-administered medications for this visit.    Allergies:   Patient has no known allergies.   Social History:  The patient  reports that he has never smoked. He has never used smokeless tobacco. He reports that he does not drink alcohol or use drugs.   Family History:  + HTN   ROS:  Please see the history of present illness.   All other systems are personally reviewed and negative.   Physical Exam: Vitals:   11/10/19 0610  BP: 118/76  Pulse: 60  Resp: 14  Temp: 98.2 F (36.8 C)  TempSrc: Skin  SpO2: 98%  Weight: 69.4 kg  Height: 5\' 10"  (1.778 m)    Derrick- The patient is well appearing, alert and oriented x 3 today.   Head- normocephalic, atraumatic Eyes-  Sclera clear, conjunctiva pink Ears- hearing intact  Oropharynx- clear Neck- supple, Lungs-   normal work of breathing Heart- Regular rate and rhythm  GI- soft,  Extremities- no clubbing, cyanosis, or edema, groin is without    Labs/Other Tests and Data Reviewed:    Recent Labs: 06/15/2019: Hemoglobin 14.7; Magnesium 2.3; Platelets 178; TSH 2.200 09/05/2019: BUN 15; Creatinine, Ser 0.89; Potassium 4.3; Sodium 142      Wt Readings from Last 3 Encounters:  09/19/19 155 lb (70.3 kg)  08/17/19 149 lb (67.6 kg)  08/01/19 159 lb 12.8 oz (72.5 kg)    ekg 08/01/19- sinus bradycardia, LAHB   Other studies personally reviewed: Additional studies/ records that were reviewed today include: AF clinic notes, prior echo, recent cardiac CT  Review of the above records today demonstrates:  As  above   ASSESSMENT & PLAN:    1.  paroxysmal atrial fibrillation The patient has symptomatic, recurrent paroxysmal atrial fibrillation. Medical therapy is limted by bradycardia Chads2vasc score is 3.  he is anticoagulated with eliquis .  Risk, benefits, and alternatives to EP study and radiofrequency ablation for afib were also discussed in detail today. These risks include but are not limited to stroke, bleeding, vascular damage, tamponade, perforation, damage to the esophagus, lungs, and other structures, pulmonary vein stenosis, worsening renal function, and death. The patient understands these risk and wishes to proceed.    Cardiac CT reviewed with patient today.  He reports compliance with eliquis without interruption  [name] MD, [facility] 11/10/2019 7:21 AM

## 2019-11-10 NOTE — Transfer of Care (Signed)
Immediate Anesthesia Transfer of Care Note  Patient: Derrick Compton  Procedure(s) Performed: ATRIAL FIBRILLATION ABLATION (N/A )  Patient Location: PACU  Anesthesia Type:General  Level of Consciousness: awake  Airway & Oxygen Therapy: Patient Spontanous Breathing  Post-op Assessment: Report given to RN and Post -op Vital signs reviewed and stable  Post vital signs: Reviewed and stable  Last Vitals:  Vitals Value Taken Time  BP 120/49 11/10/19 0957  Temp 36.1 C 11/10/19 0956  Pulse 92 11/10/19 0958  Resp 21 11/10/19 0958  SpO2 94 % 11/10/19 0958  Vitals shown include unvalidated device data.  Last Pain:  Vitals:   11/10/19 0956  TempSrc: Temporal  PainSc: 0-No pain         Complications: No complications documented.

## 2019-11-10 NOTE — Discharge Instructions (Signed)
Cardiac Ablation, Care After This sheet gives you information about how to care for yourself after your procedure. Your health care provider may also give you more specific instructions. If you have problems or questions, contact your health care provider. What can I expect after the procedure? After the procedure, it is common to have:  Bruising around your puncture site.  Tenderness around your puncture site.  Skipped heartbeats.  Tiredness (fatigue). Follow these instructions at home: Puncture site care   Follow instructions from your health care provider about how to take care of your puncture site. Make sure you: ? Wash your hands with soap and water before you change your bandage (dressing). If soap and water are not available, use hand sanitizer. ? Change your dressing as told by your health care provider. ? Leave stitches (sutures), skin glue, or adhesive strips in place. These skin closures may need to stay in place for up to 2 weeks. If adhesive strip edges start to loosen and curl up, you may trim the loose edges. Do not remove adhesive strips completely unless your health care provider tells you to do that.  Check your puncture site every day for signs of infection. Check for: ? Redness, swelling, or pain. ? Fluid or blood. If your puncture site starts to bleed, lie down on your back, apply firm pressure to the area, and contact your health care provider. ? Warmth. ? Pus or a bad smell. Driving  Ask your health care provider when it is safe for you to drive again after the procedure.  Do not drive or use heavy machinery while taking prescription pain medicine.  Do not drive for 24 hours if you were given a medicine to help you relax (sedative) during your procedure. Activity  Avoid activities that take a lot of effort for at least 3 days after your procedure.  Do not lift anything that is heavier than 10 lb (4.5 kg), or the limit that you are told, until your health  care provider says that it is safe.  Return to your normal activities as told by your health care provider. Ask your health care provider what activities are safe for you. General instructions  Take over-the-counter and prescription medicines only as told by your health care provider.  Do not use any products that contain nicotine or tobacco, such as cigarettes and e-cigarettes. If you need help quitting, ask your health care provider.  Do not take baths, swim, or use a hot tub until your health care provider approves.  Do not drink alcohol for 24 hours after your procedure.  Keep all follow-up visits as told by your health care provider. This is important. Contact a health care provider if:  You have redness, mild swelling, or pain around your puncture site.  You have fluid or blood coming from your puncture site that stops after applying firm pressure to the area.  Your puncture site feels warm to the touch.  You have pus or a bad smell coming from your puncture site.  You have a fever.  You have chest pain or discomfort that spreads to your neck, jaw, or arm.  You are sweating a lot.  You feel nauseous.  You have a fast or irregular heartbeat.  You have shortness of breath.  You are dizzy or light-headed and feel the need to lie down.  You have pain or numbness in the arm or leg closest to your puncture site. Get help right away if:  Your puncture   site suddenly swells.  Your puncture site is bleeding and the bleeding does not stop after applying firm pressure to the area. These symptoms may represent a serious problem that is an emergency. Do not wait to see if the symptoms will go away. Get medical help right away. Call your local emergency services (911 in the [place] Do not drive yourself to the hospital. Summary  After the procedure, it is normal to have bruising and tenderness at the puncture site in your groin, neck, or forearm.  Check your puncture site every  day for signs of infection.  Get help right away if your puncture site is bleeding and the bleeding does not stop after applying firm pressure to the area. This is a medical emergency. This information is not intended to replace advice given to you by your health care provider. Make sure you discuss any questions you have with your health care provider. Document Revised: 04/24/2017 Document Reviewed: 08/21/2016 Elsevier Patient Education  2020 [employer]  

## 2019-11-10 NOTE — Anesthesia Postprocedure Evaluation (Signed)
Anesthesia Post Note  Patient: Derrick Compton  Procedure(s) Performed: ATRIAL FIBRILLATION ABLATION (N/A )     Patient location during evaluation: PACU Anesthesia Type: General Level of consciousness: awake and alert, oriented and awake Pain management: pain level controlled Vital Signs Assessment: post-procedure vital signs reviewed and stable Respiratory status: spontaneous breathing, nonlabored ventilation, respiratory function stable and patient connected to nasal cannula oxygen Cardiovascular status: blood pressure returned to baseline and stable Postop Assessment: no apparent nausea or vomiting Anesthetic complications: no   No complications documented.  Last Vitals:  Vitals:   11/10/19 1302 11/10/19 1332  BP: 113/73 (!) 107/52  Pulse:    Resp: 15 18  Temp:    SpO2: 96% 95%    Last Pain:  Vitals:   11/10/19 1028  TempSrc: Temporal  PainSc:                  Catalina Gravel

## 2019-11-11 ENCOUNTER — Encounter (HOSPITAL_COMMUNITY): Payer: Self-pay | Admitting: Internal Medicine

## 2019-11-17 ENCOUNTER — Telehealth: Payer: Self-pay | Admitting: Internal Medicine

## 2019-11-17 MED ORDER — DILTIAZEM HCL ER COATED BEADS 180 MG PO CP24
180.0000 mg | ORAL_CAPSULE | Freq: Every day | ORAL | 11 refills | Status: DC
Start: 2019-11-17 — End: 2019-12-08

## 2019-11-17 NOTE — Telephone Encounter (Signed)
STAT if HR is under 50 or over 120 (normal HR is 60-100 beats per minute)  1) What is your heart rate? 77  2) Do you have a log of your heart rate readings (document readings)?   3) Do you have any other symptoms? Chest feels tight; While driving and HR was up to 186....about 3 or 4 times, it went up to the 180's. Chest pain feels like a nervous type pain

## 2019-11-17 NOTE — Telephone Encounter (Signed)
Pt calling today with c/o 3 elevated HR in the 180's in the past 4 hours. He states his heart rate will increase and decrease with no trigger. His BP currently is 124/70 and he is not symptomatic with his elevated HR. He has taken his PRN 30mg  Cardizem at 1:30pm today, but continues to have an elevated HR of 113 at rest.   Per Dr Curt Bears, DOD, pt should begin 180mg  Diltiazem CD, po qd and follow up with the AF clinic tomorrow for evaluation. Pt was educated he can take his PRN if needed q4h after he has taken his CD diltiazem if needed for HR>100. He understands to monitor his BP if he must take another PRN dose with the CD dose.   Appt made for AF clinic tomorrow AM. Pt was given directions to office and garage code.   He had no additional needs at this time.

## 2019-11-18 ENCOUNTER — Ambulatory Visit (HOSPITAL_COMMUNITY)
Admission: RE | Admit: 2019-11-18 | Discharge: 2019-11-18 | Disposition: A | Discharge status: Home / Self Care | Payer: Medicare Other | Source: Ambulatory Visit | Attending: Nurse Practitioner | Admitting: Nurse Practitioner

## 2019-11-18 ENCOUNTER — Other Ambulatory Visit: Payer: Self-pay

## 2019-11-18 ENCOUNTER — Encounter (HOSPITAL_COMMUNITY): Payer: Self-pay | Admitting: Nurse Practitioner

## 2019-11-18 VITALS — BP 100/52 | HR 60 | Ht 70.0 in | Wt 155.6 lb

## 2019-11-18 DIAGNOSIS — I2510 Atherosclerotic heart disease of native coronary artery without angina pectoris: Secondary | ICD-10-CM | POA: Diagnosis not present

## 2019-11-18 DIAGNOSIS — M8580 Other specified disorders of bone density and structure, unspecified site: Secondary | ICD-10-CM | POA: Insufficient documentation

## 2019-11-18 DIAGNOSIS — Z7901 Long term (current) use of anticoagulants: Secondary | ICD-10-CM | POA: Insufficient documentation

## 2019-11-18 DIAGNOSIS — F419 Anxiety disorder, unspecified: Secondary | ICD-10-CM | POA: Diagnosis not present

## 2019-11-18 DIAGNOSIS — I480 Paroxysmal atrial fibrillation: Secondary | ICD-10-CM

## 2019-11-18 DIAGNOSIS — I350 Nonrheumatic aortic (valve) stenosis: Secondary | ICD-10-CM | POA: Diagnosis not present

## 2019-11-18 DIAGNOSIS — Z79899 Other long term (current) drug therapy: Secondary | ICD-10-CM | POA: Diagnosis not present

## 2019-11-18 DIAGNOSIS — N400 Enlarged prostate without lower urinary tract symptoms: Secondary | ICD-10-CM | POA: Diagnosis not present

## 2019-11-18 DIAGNOSIS — E785 Hyperlipidemia, unspecified: Secondary | ICD-10-CM | POA: Diagnosis not present

## 2019-11-18 DIAGNOSIS — Z87442 Personal history of urinary calculi: Secondary | ICD-10-CM | POA: Insufficient documentation

## 2019-11-18 DIAGNOSIS — D6869 Other thrombophilia: Secondary | ICD-10-CM

## 2019-11-18 NOTE — Progress Notes (Signed)
Primary Care Physician: Alonna Buckler, MD Referring Physician: Dr. Rayann Heman Cardiologist: Dr. Lyman Speller Derrick Compton is a 85 y.o. male with a h/o CAD,mild aortic stenosis, kidney stones, that is in the afib clinic for a Zio patch noting new onset afib. The monitor was placed after pt noted palpitations, more notable at the gym, swinging the kettle bell, where he will often drink a Red Bull before his work out.  He does not smoke, minimal alcohol, no snoring/apnea. He is slim and works out on a regular basis.   He was placed on eliquis 5 mg bid for a CHA2DS2VASc 3. Dr. Rayann Heman wanted to  start daily Cardizem but his BP is soft at times  plus monitor did also show some  Bradycardia.   He is in SR today with PAC's in a bigeminal rhythm.     F/u in afib clinic, 08/01/19. Since I saw pt, he was seen in the [facility] in [place] as he has property there, and wanted a second opinion. He was seen by EP, who discussed with general cardiology as well 2/2 his exertional chest pain and his prior known 90%  blockage. It was suggested by EP to start metoprolol xl 50 mg qd and it also states that pt is on ranexa 1000 mg bid which our med list does not substantiate. Pt did take 2 BB doses and it made him feel worse and he stopped it. He would also like to stop eliquis but his  CHA2DS2VASc is 3 so by guidelines this would not be recommended.EP   also discussed antiarrythmic's or ablation may be indicated. Again with an ablation, pt thought he may be able to get rid of anticoagulation but again this is not guidelines. His afib burden overall is low, having 2 fifteen min episodes in the last month, resolved without intervention. His biggest concern, as well as the MD's that discussed his case at [place],  was his complaints of increase in exertional chest pain and shortness of breath that has escalated in the last several months.  He did have a spell of this recently playing golf. The exertional chest pain is not  associated with afib episodes. His resting HR of 51 and soft BP make it difficult to use daily rate control meds.   F/u in afib clinic 6/25. He had ablation 6/17 and had paroxysmal afib yesterday that he treated with Cardizem 30 mg. He is in SR with PAC's today. He took one dose of PPI and has not taken any more. States no indigestion and he does not want to take. Reminded pt that afib after ablation is not uncommon and it does not mean a failure of the procedure.  He has soft blood pressures and does not take the daily Cardizem. No groin issues. He ahs been walking up to 2 miles for the last couple of days.  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 Will add his name to list.   Today, he denies symptoms of palpitations, chest pain, shortness of breath, orthopnea, PND, lower extremity edema, dizziness, presyncope, syncope, or neurologic sequela. The patient is tolerating medications without difficulties and is otherwise without complaint today.   Past Medical History:  Diagnosis Date  . Anxiety   . BPH (benign prostatic hyperplasia)   . CAD (coronary artery disease)   . Chest pain   . Hyperlipidemia   . Osteopenia   . Paroxysmal  atrial fibrillation [facility])    Past Surgical History:  Procedure Laterality Date  . ATRIAL FIBRILLATION ABLATION N/A 11/10/2019   Procedure: ATRIAL FIBRILLATION ABLATION;  Surgeon: [name], MD;  Location: [place] [facility];  Service: Cardiovascular;  Laterality: N/A;  . LEFT HEART CATH AND CORONARY ANGIOGRAPHY N/A 02/05/2017   Procedure: LEFT HEART CATH AND CORONARY ANGIOGRAPHY;  Surgeon: Lorretta Harp, MD;  Location: [place] [facility];  Service: Cardiovascular;  Laterality: N/A;    Current Outpatient Medications  Medication Sig Dispense Refill  . ALPRAZolam (XANAX) 0.5 MG tablet Take 0.5 mg by mouth at bedtime as needed for sleep.     [name] apixaban (ELIQUIS) 5 MG TABS tablet Take 1 tablet (5 mg total) by  mouth 2 (two) times daily. 60 tablet 11  . diltiazem (CARDIZEM CD) 180 MG 24 hr capsule Take 1 capsule (180 mg total) by mouth daily. 30 capsule 11  . diltiazem (CARDIZEM) 30 MG tablet Take 1 tablet every 4 hours AS NEEDED for AFIB heart rate >100 (Patient taking differently: Take 30 mg by mouth See admin instructions. Take 1 tablet every 4 hours AS NEEDED for AFIB heart rate >100) 45 tablet 1  . mirtazapine (REMERON) 30 MG tablet TAKE 1 TABLET BY MOUTH EVERYDAY AT BEDTIME 30 tablet 1  . Protein POWD Take 1 Scoop by mouth See admin instructions. Take monday- Friday vega essentials shake    . rosuvastatin (CRESTOR) 40 MG tablet Take 40 mg by mouth daily.   3  . sertraline (ZOLOFT) 50 MG tablet TAKE 3 TABLETS BY MOUTH EVERY DAY (Patient taking differently: Take 50 mg by mouth daily. ) 270 tablet 1  . tamsulosin (FLOMAX) 0.4 MG CAPS capsule Take 0.4 mg by mouth daily.     No current facility-administered medications for this encounter.    No Known Allergies  Social History   Socioeconomic History  . Marital status: Married    Spouse name: Not on file  . Number of children: Not on file  . Years of education: Not on file  . Highest education level: Not on file  Occupational History  . Not on file  Tobacco Use  . Smoking status: Never Smoker  . Smokeless tobacco: Never Used  Substance and Sexual Activity  . Alcohol use: No  . Drug use: No  . Sexual activity: Not on file  Other Topics Concern  . Not on file  Social History Narrative   Lives in [place] with wife.      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   Social Determinants of Health   Financial Resource Strain:   . Difficulty of Paying Living Expenses:   Food Insecurity:   . Worried About Charity fundraiser in the Last Year:   . Arboriculturist in the Last Year:   Transportation Needs:   . Film/video editor (Medical):   [name] Lack of Transportation (Non-Medical):   Physical Activity:   .  Days of Exercise per Week:   . Minutes of Exercise per Session:   Stress:   . Feeling of Stress :   Social Connections:   . Frequency of Communication with Friends and Family:   . Frequency of Social Gatherings with Friends and Family:   . Attends Religious Services:   . Active Member of Clubs or Organizations:   . Attends Archivist Meetings:   [name] Marital Status:   Intimate Partner Violence:   . Fear of  Current or Ex-Partner:   . Emotionally Abused:   [name] Physically Abused:   . Sexually Abused:     No family history on file.  ROS- All systems are reviewed and negative except as per the HPI above  Physical Exam: Vitals:   11/18/19 0923  BP: (!) 100/52  Pulse: 60  Weight: 70.6 kg  Height: 5\' 10"  (1.778 m)   Wt Readings from Last 3 Encounters:  11/18/19 70.6 kg  11/10/19 69.4 kg  09/27/19 71.6 kg    Labs: Lab Results  Component Value Date   NA 140 11/07/2019   K 4.1 11/07/2019   CL 104 11/07/2019   CO2 29 11/07/2019   GLUCOSE 136 (H) 11/07/2019   BUN 16 11/07/2019   CREATININE 0.88 11/07/2019   CALCIUM 9.4 11/07/2019   MG 2.3 06/15/2019   Lab Results  Component Value Date   INR 1.1 01/27/2017   Lab Results  Component Value Date   CHOL 196 02/04/2011   HDL 63 02/04/2011   LDLCALC 113 (H) 02/04/2011   TRIG 99 02/04/2011     GEN- The patient is well appearing, alert and oriented x 3 today.   Head- normocephalic, atraumatic Eyes-  Sclera clear, conjunctiva pink Ears- hearing intact Oropharynx- clear Neck- supple, no JVP Lymph- no cervical lymphadenopathy Lungs- Clear to ausculation bilaterally, normal work of breathing Heart- Regular rate and rhythm, no murmurs, rubs or gallops, PMI not laterally displaced GI- soft, NT, ND, + BS Extremities- no clubbing, cyanosis, or edema MS- no significant deformity or atrophy Skin- no rash or lesion Psych- euthymic mood, full affect Neuro- strength and sensation are intact  EKG- SR with pac's and  junctional escape beats at 60 bpm  Echo-1. The left ventricle has normal systolic function, with an ejection  fraction of 55-60%. The cavity size was normal. Left ventricular diastolic  Doppler parameters are consistent with impaired relaxation. Indeterminate  filling pressures.  2. The right ventricle has normal systolic function. The cavity was  normal. There is no increase in right ventricular wall thickness. Right  ventricular systolic pressure could not be assessed.  3. The mitral valve is abnormal. Mild thickening of the mitral valve  leaflet. There is mild mitral annular calcification present.  4. The aortic valve is abnormal. Mild calcification of the aortic valve.  Aortic valve regurgitation is trivial by color flow Doppler. Mild stenosis  of the aortic valve. Maximum velocity obtained in the apical view.  5. AV Mean Sys Grad:9.0 mmHg.  6. There is mild dilatation of the ascending aorta measuring 41 mm.   Zio patch-Preliminary Findings Patient had a min HR of 42 bpm, max HR of 139 bpm, and avg HR of 61 bpm. Predominant underlying rhythm was Sinus Rhythm. Bundle Branch Block/IVCD was present. 45 Supraventricular Tachycardia runs occurred, the run with the fastest interval lasting 16 beats with a max rate of 139 bpm, the longest lasting 16 beats with an avg rate of 98 bpm. Atrial Fibrillation occurred (2% burden), ranging from 58-137 bpm (avg of 99 bpm), the longest lasting 1 hour 24 mins with an avg rate of 99 bpm. Atrial Fibrillation was detected within +/- 45 seconds of symptomatic patient event(s). Isolated SVEs were frequent (11.1%, [id number]), SVE Couplets were rare (<1.0%, 3318), and SVE Triplets were rare (<1.0%, 145). Isolated VEs were rare (<1.0%), and no VE Couplets or VE Triplets were present.  Assessment and Plan: 1. Paroxysmal afib  Afib ablation one week ago afib episodes noted yesterday He was  assured that this can be common after ablation  He uses 30 mg  Cardizem as needed  Back in rhythm today   2. CHA2DS2VASc of 3  Continue eliquis 5 mg bid  He would like to be considered  for Watchman when available   2. CAD No anginal symptoms   afib clinic as scheduled 7/15 for one month ablation f/u, sooner if needed   Butch Penny C. Chanceler Pullin, [facility] [address] [place], [place] [address] [phone]

## 2019-11-22 ENCOUNTER — Other Ambulatory Visit: Payer: Self-pay

## 2019-11-22 ENCOUNTER — Encounter: Payer: Self-pay | Admitting: Psychiatry

## 2019-11-22 ENCOUNTER — Ambulatory Visit (INDEPENDENT_AMBULATORY_CARE_PROVIDER_SITE_OTHER): Payer: Medicare Other | Admitting: Psychiatry

## 2019-11-22 DIAGNOSIS — F4001 Agoraphobia with panic disorder: Secondary | ICD-10-CM | POA: Diagnosis not present

## 2019-11-22 DIAGNOSIS — F331 Major depressive disorder, recurrent, moderate: Secondary | ICD-10-CM | POA: Diagnosis not present

## 2019-11-22 DIAGNOSIS — F411 Generalized anxiety disorder: Secondary | ICD-10-CM | POA: Diagnosis not present

## 2019-11-22 DIAGNOSIS — F5105 Insomnia due to other mental disorder: Secondary | ICD-10-CM

## 2019-11-22 DIAGNOSIS — I2510 Atherosclerotic heart disease of native coronary artery without angina pectoris: Secondary | ICD-10-CM | POA: Diagnosis not present

## 2019-11-22 MED ORDER — SERTRALINE HCL 100 MG PO TABS: 100.0000 mg | ORAL_TABLET | Freq: Every day | ORAL | 1 refills | Status: DC

## 2019-11-22 MED ORDER — LORAZEPAM 0.5 MG PO TABS: 0.5000 mg | ORAL_TABLET | Freq: Three times a day (TID) | ORAL | 1 refills | Status: DC

## 2019-11-22 MED ORDER — LITHIUM CARBONATE 150 MG PO CAPS: 150.0000 mg | ORAL_CAPSULE | Freq: Every evening | ORAL | 1 refills | Status: DC

## 2019-11-22 NOTE — Progress Notes (Signed)
Derrick Compton [id number] [birth year] 85 y.o.  Subjective:   Patient ID:  Derrick Compton is a 85 y.o. (DOB 01/15/1935) male.  Chief Complaint:  Chief Complaint  Patient presents with  . Follow-up  . Anxiety    Depression        Associated symptoms include no decreased concentration and no suicidal ideas.  Derrick Compton presents to the office today for follow-up of TRD  And panic and GAD.   At  visit in December he was depressed.  Started mirtazapine and very low dose lithium.  Awakens with a little depression.  Sleep is pretty good and rarely needs bz.  Depression better after a week or so.  Tolerating meds ok.  Not gaining weight like when on mirtazapine before.   When seen July 28, 2018.  No meds were changed. However at some point he discontinued lithium 150 mg daily.  He stopped bc didn't seem to be doing anything and read about all the side effects.  When seen October 2020 the following observations were made: He has relapsed again and the only change is he stopped lithium.  He doesn't think it helped but I question that. He prefers to increase sertraline over restarting lithium.  Tried to address his fears about lithium. Option increase mirtazapine but may lose sleep benefit. Increase sertraline to 50 mg tabs 3 daily for total of 150 mg daily.  December 2020 appointment the following is noted: More  Groggy with increase sertraline and reduced it back to the original 50 mg daily for about 6 weeks. Went into Mind Stress Management and meditation and it seems to help a good bit.  Thinks a lot of his depression is anxiety over his health fears and the meditation helps to deal with it.  covid didn't help his mood.  Worries over his heart bc he has some issues with it. Can enjoy watching a ball game and function is better.  Doesn't have to stay so busy to be OK now.  Recognizes he needs to change the way he thinks to live with more peace. Rare alprazolam about 2 times monthly.   Business doing well. Notices calming effect from sertraline.  Fewer speeding tickets. Patient reports stable mood and denies depressed or irritable moods.  Less difficulty with anxiety.  Patient denies difficulty with sleep initiation or maintenance. NO appetite disturbance.  Patient reports that energy and motivation have been good.  Patient denies any difficulty with concentration.  Patient denies any suicidal ideation.  Plan:Continue sertraline 50 Continue mirtazapine 30 HS.  11/22/2019 appointment the following is noted: Not so well.  Afib ablation last Thursday and dosen't feel better. Anxiety is worse esp AM.  Like 2 different people.  Later in day is better. AM don't want to do anything and scared.  Normal by lunch usually. Not taking BZ.  Tried it and gets wiped out. If takes both alprazolam and mirtazapine at night then gets agitated sleep. Sleep good and no panic. Taking mirtazapine 30 and sertraline 100 daily. He doesn't like taking multiple meds.  Past psychiatric medications Wellbutrin 150, citalopram, paroxetine, Deplin,  fluoxetine side effects, sertraline 150  Groggy buspirone, Abilify 2 mg, Rexulti 2 mg, lithium 150 Lorazepam, alprazolam 0.5  Review of Systems:  Review of Systems  Constitutional: Negative for activity change and unexpected weight change.  Cardiovascular: Positive for palpitations.  Neurological: Negative for tremors and weakness.  Psychiatric/Behavioral: Positive for depression and dysphoric mood. Negative for agitation, behavioral problems, confusion, decreased  concentration, hallucinations, self-injury, sleep disturbance and suicidal ideas. The patient is nervous/anxious. The patient is not hyperactive.     Medications: I have reviewed the patient's current medications.  Current Outpatient Medications  Medication Sig Dispense Refill  . apixaban (ELIQUIS) 5 MG TABS tablet Take 1 tablet (5 mg total) by mouth 2 (two) times daily. 60 tablet 11  .  diltiazem (CARDIZEM CD) 180 MG 24 hr capsule Take 1 capsule (180 mg total) by mouth daily. 30 capsule 11  . diltiazem (CARDIZEM) 30 MG tablet Take 1 tablet every 4 hours AS NEEDED for AFIB heart rate >100 (Patient taking differently: Take 30 mg by mouth See admin instructions. Take 1 tablet every 4 hours AS NEEDED for AFIB heart rate >100) 45 tablet 1  . mirtazapine (REMERON) 30 MG tablet TAKE 1 TABLET BY MOUTH EVERYDAY AT BEDTIME 30 tablet 1  . Protein POWD Take 1 Scoop by mouth See admin instructions. Take monday- Friday vega essentials shake    . rosuvastatin (CRESTOR) 40 MG tablet Take 40 mg by mouth daily.   3  . sertraline (ZOLOFT) 100 MG tablet Take 1 tablet (100 mg total) by mouth daily. 90 tablet 1  . tamsulosin (FLOMAX) 0.4 MG CAPS capsule Take 0.4 mg by mouth daily.    [name] lithium carbonate 150 MG capsule Take 1 capsule (150 mg total) by mouth at bedtime. 90 capsule 1  . LORazepam (ATIVAN) 0.5 MG tablet Take 1 tablet (0.5 mg total) by mouth every 8 (eight) hours. 30 tablet 1   No current facility-administered medications for this visit.    Medication Side Effects: None  Allergies: No Known Allergies  Past Medical History:  Diagnosis Date  . Anxiety   . BPH (benign prostatic hyperplasia)   . CAD (coronary artery disease)   . Chest pain   . Hyperlipidemia   . Osteopenia   . Paroxysmal atrial fibrillation ([place])     History reviewed. No pertinent family history.  Social History   Socioeconomic History  . Marital status: Married    Spouse name: Not on file  . Number of children: Not on file  . Years of education: Not on file  . Highest education level: Not on file  Occupational History  . Not on file  Tobacco Use  . Smoking status: Never Smoker  . Smokeless tobacco: Never Used  Substance and Sexual Activity  . Alcohol use: No  . Drug use: No  . Sexual activity: Not on file  Other Topics Concern  . Not on file  Social History Narrative   Lives in [place] with  wife.      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   Social Determinants of Health   Financial Resource Strain:   . Difficulty of Paying Living Expenses:   Food Insecurity:   . Worried About Charity fundraiser in the Last Year:   . Arboriculturist in the Last Year:   Transportation Needs:   . Film/video editor (Medical):   [name] Lack of Transportation (Non-Medical):   Physical Activity:   . Days of Exercise per Week:   . Minutes of Exercise per Session:   Stress:   . Feeling of Stress :   Social Connections:   . Frequency of Communication with Friends and Family:   . Frequency of Social Gatherings with Friends and Family:   . Attends Religious Services:   . Active Member of Clubs or Organizations:   .  Attends Archivist Meetings:   [name] Marital Status:   Intimate Partner Violence:   . Fear of Current or Ex-Partner:   . Emotionally Abused:   [name] Physically Abused:   . Sexually Abused:     Past Medical History, Surgical history, Social history, and Family history were reviewed and updated as appropriate.   Please see review of systems for further details on the patient's review from today.   Objective:   Physical Exam:  There were no vitals taken for this visit.  Physical Exam Constitutional:      General: He is not in acute distress.    Appearance: He is well-developed.  Musculoskeletal:        General: No deformity.  Neurological:     Mental Status: He is alert and oriented to person, place, and time.     Motor: No tremor.     Coordination: Coordination normal.     Gait: Gait normal.  Psychiatric:        Attention and Perception: He is attentive. He does not perceive auditory hallucinations.        Mood and Affect: Mood is anxious and depressed. Affect is not labile, blunt, angry or inappropriate.        Speech: Speech normal.        Behavior: Behavior normal.        Thought Content: Thought content normal. Thought content does  not include homicidal or suicidal ideation. Thought content does not include homicidal or suicidal plan.        Cognition and Memory: Cognition normal.        Judgment: Judgment normal.     Comments: Insight and judgment fair. No auditory or visual hallucinations. No delusions.  Ruminating stopped.     Lab Review:     Component Value Date/Time   NA 140 11/07/2019 0000   K 4.1 11/07/2019 0000   CL 104 11/07/2019 0000   CO2 29 11/07/2019 0000   GLUCOSE 136 (H) 11/07/2019 0000   GLUCOSE 108 (H) 02/11/2011 0533   BUN 16 11/07/2019 0000   CREATININE 0.88 11/07/2019 0000   CALCIUM 9.4 11/07/2019 0000   PROT 6.9 02/04/2011 0840   ALBUMIN 3.8 02/04/2011 0840   AST 18 02/04/2011 0840   ALT 18 02/04/2011 0840   ALKPHOS 75 02/04/2011 0840   BILITOT 0.6 02/04/2011 0840   GFRNONAA 80 11/07/2019 0000   GFRAA 92 11/07/2019 0000       Component Value Date/Time   WBC 6.0 11/07/2019 0000   WBC 5.6 02/09/2011 0510   RBC 4.36 11/07/2019 0000   RBC 4.34 02/09/2011 0510   HGB 13.4 11/07/2019 0000   HCT 39.8 11/07/2019 0000   PLT 237 11/07/2019 0000   MCV 91 11/07/2019 0000   MCH 30.7 11/07/2019 0000   MCH 31.3 02/09/2011 0510   MCHC 33.7 11/07/2019 0000   MCHC 35.1 02/09/2011 0510   RDW 14.3 11/07/2019 0000   LYMPHSABS 1.2 11/07/2019 0000   MONOABS 0.8 02/05/2011 0340   EOSABS 0.2 11/07/2019 0000   BASOSABS 0.0 11/07/2019 0000    No results found for: POCLITH, LITHIUM   No results found for: PHENYTOIN, PHENOBARB, VALPROATE, CBMZ   .res Assessment: Plan:    Major depressive disorder, recurrent episode, moderate (HCC) - Plan: lithium carbonate 150 MG capsule, sertraline (ZOLOFT) 100 MG tablet  Generalized anxiety disorder - Plan: LORazepam (ATIVAN) 0.5 MG tablet  Panic disorder with agoraphobia - Plan: LORazepam (ATIVAN) 0.5 MG tablet  Insomnia due  to mental condition   Hx multiple recurrences of depression some of which were due to to noncompliance with medication..  Many  of the recurrences have been due to noncompliance for various reasons.  He is medication sensitive which complicates things.  Failed multiple meds and types of meds.  He has relapsed again with sx in the morning resolved by evening. Depression and anxiety is worse. Claims compliance with sertraline.  Do not change AMA. Continue sertraline 100 Continue mirtazapine 30 HS. Restart lithium 150 which helped before. Change alprazolam 0.5 to lorazepam 0.5 prn for better tolerability.  FU 3-4  mos  Lynder Parents, MD, DFAPA   Please see After Visit Summary for patient specific instructions.  Future Appointments  Date Time Provider Rooks  12/08/2019 11:30 AM Sherran Needs, NP [facility] None  01/03/2020 10:15 AM Lorretta Harp, MD [facility]  02/13/2020 11:30 AM Allred, Jeneen Rinks, MD [facility]    No orders of the defined types were placed in this encounter.     -------------------------------

## 2019-12-08 ENCOUNTER — Ambulatory Visit (HOSPITAL_COMMUNITY)
Admission: RE | Admit: 2019-12-08 | Discharge: 2019-12-08 | Disposition: A | Discharge status: Home / Self Care | Payer: Medicare Other | Source: Ambulatory Visit | Attending: Nurse Practitioner | Admitting: Nurse Practitioner

## 2019-12-08 ENCOUNTER — Encounter (HOSPITAL_COMMUNITY): Payer: Self-pay | Admitting: Nurse Practitioner

## 2019-12-08 ENCOUNTER — Other Ambulatory Visit: Payer: Self-pay

## 2019-12-08 ENCOUNTER — Ambulatory Visit (HOSPITAL_COMMUNITY): Payer: Medicare Other | Admitting: Physician Assistant

## 2019-12-08 VITALS — BP 122/70 | HR 63 | Ht 70.0 in | Wt 157.2 lb

## 2019-12-08 DIAGNOSIS — I480 Paroxysmal atrial fibrillation: Secondary | ICD-10-CM | POA: Diagnosis present

## 2019-12-08 DIAGNOSIS — Z87442 Personal history of urinary calculi: Secondary | ICD-10-CM | POA: Insufficient documentation

## 2019-12-08 DIAGNOSIS — E785 Hyperlipidemia, unspecified: Secondary | ICD-10-CM | POA: Insufficient documentation

## 2019-12-08 DIAGNOSIS — Z7901 Long term (current) use of anticoagulants: Secondary | ICD-10-CM | POA: Diagnosis not present

## 2019-12-08 DIAGNOSIS — D6869 Other thrombophilia: Secondary | ICD-10-CM | POA: Diagnosis not present

## 2019-12-08 DIAGNOSIS — Z79899 Other long term (current) drug therapy: Secondary | ICD-10-CM | POA: Insufficient documentation

## 2019-12-08 DIAGNOSIS — I2510 Atherosclerotic heart disease of native coronary artery without angina pectoris: Secondary | ICD-10-CM | POA: Diagnosis not present

## 2019-12-08 NOTE — Progress Notes (Signed)
Primary Care Physician: Alonna Buckler, MD Referring Physician: Dr. Rayann Heman Cardiologist: Dr. Lyman Speller Derrick Compton is a 85 y.o. male with a h/o CAD,mild aortic stenosis, kidney stones, that is in the afib clinic for a Zio patch noting new onset afib. The monitor was placed after pt noted palpitations, more notable at the gym, swinging the kettle bell, where he will often drink a Red Bull before his work out.  He does not smoke, minimal alcohol, no snoring/apnea. He is slim and works out on a regular basis.   He was placed on eliquis 5 mg bid for a CHA2DS2VASc 3. Dr. Rayann Heman wanted to  start daily Cardizem but his BP is soft at times  plus monitor did also show some  Bradycardia.   He is in SR today with PAC's in a bigeminal rhythm.     F/u in afib clinic, 08/01/19. Since I saw pt, he was seen in the [facility] in [place] as he has property there, and wanted a second opinion. He was seen by EP, who discussed with general cardiology as well 2/2 his exertional chest pain and his prior known 90%  blockage. It was suggested by EP to start metoprolol xl 50 mg qd and it also states that pt is on ranexa 1000 mg bid which our med list does not substantiate. Pt did take 2 BB doses and it made him feel worse and he stopped it. He would also like to stop eliquis but his  CHA2DS2VASc is 3 so by guidelines this would not be recommended.EP   also discussed antiarrythmic's or ablation may be indicated. Again with an ablation, pt thought he may be able to get rid of anticoagulation but again this is not guidelines. His afib burden overall is low, having 2 fifteen min episodes in the last month, resolved without intervention. His biggest concern, as well as the MD's that discussed his case at [facility],  was his complaints of increase in exertional chest pain and shortness of breath that has escalated in the last several months.  He did have a spell of this recently playing golf. The exertional chest pain is not  associated with afib episodes. His resting HR of 51 and soft BP make it difficult to use daily rate control meds.   F/u in afib clinic 6/25. He had ablation 6/17 and had paroxysmal afib yesterday that he treated with Cardizem 30 mg. He is in SR with PAC's today. He took one dose of PPI and has not taken any more. States no indigestion and he does not want to take. Reminded pt that afib after ablation is not uncommon and it does not mean a failure of the procedure.  He has soft blood pressures and does not take the daily Cardizem. No groin issues. He ahs been walking up to 2 miles for the last couple of days.  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 Will add his name to list.   F/u  in afib clinic one month s/p ablation. He states that he feels worse than  prior to ablation.  He is in SR but tracks his HR by his watch and will occasionally  see that it hit 200 bpm but is not sustained. He does not have the capacity to run ekg's, He feels he may be in afib every day but less than  15 mins. He will also see his HR hit  30 bpm by his watch but again not sustained. His biggest concern is that of anterior chest tightness or pressure. He notices this with exertional activity. He gave the example  that he got on the elliptical machine the other day and had anterior chest pain, He rested around 15 mins and the pain resolved. He then got on the treadmill and was able to walk without any further chest discomfort. Most of the time it is not associated with irregular heart beat. He had a cardiac CT with Dr. Gwenlyn Found in April and had mild CAD with 25-49 % stenosis in the ostial D1, minimal disease elsewhere. He has also noted that he is short of breath going up stairs which is new for him. Pt has had chest pain  in the past  worked up by Dr. Gwenlyn Found and the ostial first diagonal blockage was thought to be high risk to treat with intervention.  Today, he denies symptoms of  palpitations, chest pain, shortness of breath, orthopnea, PND, lower extremity edema, dizziness, presyncope, syncope, or neurologic sequela. The patient is tolerating medications without difficulties and is otherwise without complaint today.   Past Medical History:  Diagnosis Date  . Anxiety   . BPH (benign prostatic hyperplasia)   . CAD (coronary artery disease)   . Chest pain   . Hyperlipidemia   . Osteopenia   . Paroxysmal atrial fibrillation [facility])    Past Surgical History:  Procedure Laterality Date  . ATRIAL FIBRILLATION ABLATION N/A 11/10/2019   Procedure: ATRIAL FIBRILLATION ABLATION;  Surgeon: [name], MD;  Location: [place] [facility];  Service: Cardiovascular;  Laterality: N/A;  . LEFT HEART CATH AND CORONARY ANGIOGRAPHY N/A 02/05/2017   Procedure: LEFT HEART CATH AND CORONARY ANGIOGRAPHY;  Surgeon: Lorretta Harp, MD;  Location: [place] [facility];  Service: Cardiovascular;  Laterality: N/A;    Current Outpatient Medications  Medication Sig Dispense Refill  . apixaban (ELIQUIS) 5 MG TABS tablet Take 1 tablet (5 mg total) by mouth 2 (two) times daily. 60 tablet 11  . ciprofloxacin (CIPRO) 500 MG tablet Take 500 mg by mouth 2 (two) times daily.    [name] diltiazem (CARDIZEM) 30 MG tablet Take 1 tablet every 4 hours AS NEEDED for AFIB heart rate >100 45 tablet 1  . lithium carbonate 150 MG capsule Take 1 capsule (150 mg total) by mouth at bedtime. 90 capsule 1  . LORazepam (ATIVAN) 0.5 MG tablet Take 1 tablet (0.5 mg total) by mouth every 8 (eight) hours. 30 tablet 1  . mirtazapine (REMERON) 30 MG tablet TAKE 1 TABLET BY MOUTH EVERYDAY AT BEDTIME 30 tablet 1  . Protein POWD Take 1 Scoop by mouth See admin instructions. Take monday- Friday vega essentials shake    . rosuvastatin (CRESTOR) 40 MG tablet Take 40 mg by mouth daily.   3  . sertraline (ZOLOFT) 100 MG tablet Take 1 tablet (100 mg total) by mouth daily. 90 tablet 1  . tamsulosin (FLOMAX) 0.4 MG CAPS capsule Take 0.4  mg by mouth daily.     No current facility-administered medications for this encounter.    No Known Allergies  Social History   Socioeconomic History  . Marital status: Married    Spouse name: Not on file  . Number of children: Not on file  . Years of education: Not on file  . Highest education level: Not on file  Occupational History  . Not on file  Tobacco Use  . Smoking status: Never Smoker  .  Smokeless tobacco: Never Used  Substance and Sexual Activity  . Alcohol use: Yes    Alcohol/week: 2.0 standard drinks    Types: 2 Standard drinks or equivalent per week  . Drug use: No  . Sexual activity: Not on file  Other Topics Concern  . Not on file  Social History Narrative   Lives in [place] with wife.      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   Social Determinants of Health   Financial Resource Strain:   . Difficulty of Paying Living Expenses:   Food Insecurity:   . Worried About Charity fundraiser in the Last Year:   . Arboriculturist in the Last Year:   Transportation Needs:   . Film/video editor (Medical):   [name] Lack of Transportation (Non-Medical):   Physical Activity:   . Days of Exercise per Week:   . Minutes of Exercise per Session:   Stress:   . Feeling of Stress :   Social Connections:   . Frequency of Communication with Friends and Family:   . Frequency of Social Gatherings with Friends and Family:   . Attends Religious Services:   . Active Member of Clubs or Organizations:   . Attends Archivist Meetings:   [name] Marital Status:   Intimate Partner Violence:   . Fear of Current or Ex-Partner:   . Emotionally Abused:   [name] Physically Abused:   . Sexually Abused:     No family history on file.  ROS- All systems are reviewed and negative except as per the HPI above  Physical Exam: Vitals:   12/08/19 1140  BP: 122/70  Pulse: 63  Weight: 71.3 kg  Height: 5\' 10"  (1.778 m)   Wt Readings from Last 3  Encounters:  12/08/19 71.3 kg  11/18/19 70.6 kg  11/10/19 69.4 kg    Labs: Lab Results  Component Value Date   NA 140 11/07/2019   K 4.1 11/07/2019   CL 104 11/07/2019   CO2 29 11/07/2019   GLUCOSE 136 (H) 11/07/2019   BUN 16 11/07/2019   CREATININE 0.88 11/07/2019   CALCIUM 9.4 11/07/2019   MG 2.3 06/15/2019   Lab Results  Component Value Date   INR 1.1 01/27/2017   Lab Results  Component Value Date   CHOL 196 02/04/2011   HDL 63 02/04/2011   LDLCALC 113 (H) 02/04/2011   TRIG 99 02/04/2011     GEN- The patient is well appearing, alert and oriented x 3 today.   Head- normocephalic, atraumatic Eyes-  Sclera clear, conjunctiva pink Ears- hearing intact Oropharynx- clear Neck- supple, no JVP Lymph- no cervical lymphadenopathy Lungs- Clear to ausculation bilaterally, normal work of breathing Heart- Regular rate and rhythm, no murmurs, rubs or gallops, PMI not laterally displaced GI- soft, NT, ND, + BS Extremities- no clubbing, cyanosis, or edema MS- no significant deformity or atrophy Skin- no rash or lesion Psych- euthymic mood, full affect Neuro- strength and sensation are intact  EKG- SR with pac's, LAFB at 63 bpm  Echo-1. The left ventricle has normal systolic function, with an ejection  fraction of 55-60%. The cavity size was normal. Left ventricular diastolic  Doppler parameters are consistent with impaired relaxation. Indeterminate  filling pressures.  2. The right ventricle has normal systolic function. The cavity was  normal. There is no increase in right ventricular wall thickness. Right  ventricular systolic pressure could not be assessed.  3. The mitral valve  is abnormal. Mild thickening of the mitral valve  leaflet. There is mild mitral annular calcification present.  4. The aortic valve is abnormal. Mild calcification of the aortic valve.  Aortic valve regurgitation is trivial by color flow Doppler. Mild stenosis  of the aortic valve. Maximum  velocity obtained in the apical view.  5. AV Mean Sys Grad:9.0 mmHg.  6. There is mild dilatation of the ascending aorta measuring 41 mm.   Zio patch-Preliminary Findings  Patient had a min HR of 42 bpm, max HR of 139 bpm, and avg HR of 61 bpm. Predominant underlying rhythm was Sinus Rhythm. Bundle Branch Block/IVCD was present. 45 Supraventricular Tachycardia runs occurred, the run with the fastest interval lasting 16 beats with a max rate of 139 bpm, the longest lasting 16 beats with an avg rate of 98 bpm. Atrial Fibrillation occurred (2% burden), ranging from 58-137 bpm (avg of 99 bpm), the longest lasting 1 hour 24 mins with an avg rate of 99 bpm. Atrial Fibrillation was detected within +/- 45 seconds of symptomatic patient event(s). Isolated SVEs were frequent (11.1%, [id number]), SVE Couplets were rare (<1.0%, 3318), and SVE Triplets were rare (<1.0%, 145). Isolated VEs were rare (<1.0%), and no VE Couplets or VE Triplets were present.  Assessment and Plan: 1. Paroxysmal afib  Afib ablation one month ago Feels he is having some afib every day( less than 15 mins)  but does not have ability  to run EKG's for me to confirm He was assured that this can be common after ablation  He uses 30 mg Cardizem as needed  Does not want daily rate control  In rhythm today  No swallowing or groin issues since procedure   2. CHA2DS2VASc of 3  Continue eliquis 5 mg bid  He would like to be considered  for Watchman when available   2. CAD C/o of exertional chest pain Has c/o of  this before to Dr. Gwenlyn Found He feels it is progressive and is very concerned re this  Cardiac CT just done in April with mild CAD He feels the exertional chest pain is limiting his exercise capacity  He had a f/u with Dr. Gwenlyn Found 8/19 and I will forward my note today    F/u with Dr. Rayann Heman  9/20   Geroge Baseman. Martyn Timme, [facility] [address] [place], [place]  [address] [phone]      Primary Care Physician: Alonna Buckler, MD Referring Physician: Dr. Rayann Heman Cardiologist: Dr. Lyman Speller Derrick Compton is a 85 y.o. male with a h/o CAD,mild aortic stenosis, kidney stones, that is in the afib clinic for a Zio patch noting new onset afib. The monitor was placed after pt noted palpitations, more notable at the gym, swinging the kettle bell, where he will often drink a Red Bull before his work out.  He does not smoke, minimal alcohol, no snoring/apnea. He is slim and works out on a regular basis.   He was placed on eliquis 5 mg bid for a CHA2DS2VASc 3. Dr. Rayann Heman wanted to  start daily Cardizem but his BP is soft at times  plus monitor did also show some  Bradycardia.   He is in SR today with PAC's in a bigeminal rhythm.     F/u in afib clinic, 08/01/19. Since I saw pt, he was seen in the [facility] in [place] as he has property there, and wanted a second opinion. He was seen by EP, who discussed  with general cardiology as well 2/2 his exertional chest pain and his prior known 90%  blockage. It was suggested by EP to start metoprolol xl 50 mg qd and it also states that pt is on ranexa 1000 mg bid which our med list does not substantiate. Pt did take 2 BB doses and it made him feel worse and he stopped it. He would also like to stop eliquis but his  CHA2DS2VASc is 3 so by guidelines this would not be recommended.EP   also discussed antiarrythmic's or ablation may be indicated. Again with an ablation, pt thought he may be able to get rid of anticoagulation but again this is not guidelines. His afib burden overall is low, having 2 fifteen min episodes in the last month, resolved without intervention. His biggest concern, as well as the MD's that discussed his case at [facility],  was his complaints of increase in exertional chest pain and shortness of breath that has escalated in the last several months.  He did have a spell of this recently playing golf. The  exertional chest pain is not associated with afib episodes. His resting HR of 51 and soft BP make it difficult to use daily rate control meds.   F/u in afib clinic 6/25. He had ablation 6/17 and had paroxysmal afib yesterday that he treated with Cardizem 30 mg. He is in SR with PAC's today. He took one dose of PPI and has not taken any more. States no indigestion and he does not want to take. Reminded pt that afib after ablation is not uncommon and it does not mean a failure of the procedure.  He has soft blood pressures and does not take the daily Cardizem. No groin issues. He ahs been walking up to 2 miles for the last couple of days.  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 Will add his name to list.   Today, he denies symptoms of palpitations, chest pain, shortness of breath, orthopnea, PND, lower extremity edema, dizziness, presyncope, syncope, or neurologic sequela. The patient is tolerating medications without difficulties and is otherwise without complaint today.   Past Medical History:  Diagnosis Date  . Anxiety   . BPH (benign prostatic hyperplasia)   . CAD (coronary artery disease)   . Chest pain   . Hyperlipidemia   . Osteopenia   . Paroxysmal atrial fibrillation [facility])    Past Surgical History:  Procedure Laterality Date  . ATRIAL FIBRILLATION ABLATION N/A 11/10/2019   Procedure: ATRIAL FIBRILLATION ABLATION;  Surgeon: [name], MD;  Location: [place] [facility];  Service: Cardiovascular;  Laterality: N/A;  . LEFT HEART CATH AND CORONARY ANGIOGRAPHY N/A 02/05/2017   Procedure: LEFT HEART CATH AND CORONARY ANGIOGRAPHY;  Surgeon: Lorretta Harp, MD;  Location: [place] [facility];  Service: Cardiovascular;  Laterality: N/A;    Current Outpatient Medications  Medication Sig Dispense Refill  . apixaban (ELIQUIS) 5 MG TABS tablet Take 1 tablet (5 mg total) by mouth 2 (two) times daily. 60 tablet 11  . ciprofloxacin  (CIPRO) 500 MG tablet Take 500 mg by mouth 2 (two) times daily.    [name] diltiazem (CARDIZEM) 30 MG tablet Take 1 tablet every 4 hours AS NEEDED for AFIB heart rate >100 45 tablet 1  . lithium carbonate 150 MG capsule Take 1 capsule (150 mg total) by mouth at bedtime. 90 capsule 1  . LORazepam (ATIVAN) 0.5 MG tablet Take 1  tablet (0.5 mg total) by mouth every 8 (eight) hours. 30 tablet 1  . mirtazapine (REMERON) 30 MG tablet TAKE 1 TABLET BY MOUTH EVERYDAY AT BEDTIME 30 tablet 1  . Protein POWD Take 1 Scoop by mouth See admin instructions. Take monday- Friday vega essentials shake    . rosuvastatin (CRESTOR) 40 MG tablet Take 40 mg by mouth daily.   3  . sertraline (ZOLOFT) 100 MG tablet Take 1 tablet (100 mg total) by mouth daily. 90 tablet 1  . tamsulosin (FLOMAX) 0.4 MG CAPS capsule Take 0.4 mg by mouth daily.     No current facility-administered medications for this encounter.    No Known Allergies  Social History   Socioeconomic History  . Marital status: Married    Spouse name: Not on file  . Number of children: Not on file  . Years of education: Not on file  . Highest education level: Not on file  Occupational History  . Not on file  Tobacco Use  . Smoking status: Never Smoker  . Smokeless tobacco: Never Used  Substance and Sexual Activity  . Alcohol use: Yes    Alcohol/week: 2.0 standard drinks    Types: 2 Standard drinks or equivalent per week  . Drug use: No  . Sexual activity: Not on file  Other Topics Concern  . Not on file  Social History Narrative   Lives in [place] with wife.      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   Social Determinants of Health   Financial Resource Strain:   . Difficulty of Paying Living Expenses:   Food Insecurity:   . Worried About Charity fundraiser in the Last Year:   . Arboriculturist in the Last Year:   Transportation Needs:   . Film/video editor (Medical):   [name] Lack of Transportation  (Non-Medical):   Physical Activity:   . Days of Exercise per Week:   . Minutes of Exercise per Session:   Stress:   . Feeling of Stress :   Social Connections:   . Frequency of Communication with Friends and Family:   . Frequency of Social Gatherings with Friends and Family:   . Attends Religious Services:   . Active Member of Clubs or Organizations:   . Attends Archivist Meetings:   [name] Marital Status:   Intimate Partner Violence:   . Fear of Current or Ex-Partner:   . Emotionally Abused:   [name] Physically Abused:   . Sexually Abused:     No family history on file.  ROS- All systems are reviewed and negative except as per the HPI above  Physical Exam: Vitals:   12/08/19 1140  BP: 122/70  Pulse: 63  Weight: 71.3 kg  Height: 5\' 10"  (1.778 m)   Wt Readings from Last 3 Encounters:  12/08/19 71.3 kg  11/18/19 70.6 kg  11/10/19 69.4 kg    Labs: Lab Results  Component Value Date   NA 140 11/07/2019   K 4.1 11/07/2019   CL 104 11/07/2019   CO2 29 11/07/2019   GLUCOSE 136 (H) 11/07/2019   BUN 16 11/07/2019   CREATININE 0.88 11/07/2019   CALCIUM 9.4 11/07/2019   MG 2.3 06/15/2019   Lab Results  Component Value Date   INR 1.1 01/27/2017   Lab Results  Component Value Date   CHOL 196 02/04/2011   HDL 63 02/04/2011   LDLCALC 113 (H) 02/04/2011   TRIG 99  02/04/2011     GEN- The patient is well appearing, alert and oriented x 3 today.   Head- normocephalic, atraumatic Eyes-  Sclera clear, conjunctiva pink Ears- hearing intact Oropharynx- clear Neck- supple, no JVP Lymph- no cervical lymphadenopathy Lungs- Clear to ausculation bilaterally, normal work of breathing Heart- Regular rate and rhythm, no murmurs, rubs or gallops, PMI not laterally displaced GI- soft, NT, ND, + BS Extremities- no clubbing, cyanosis, or edema MS- no significant deformity or atrophy Skin- no rash or lesion Psych- euthymic mood, full affect Neuro- strength and sensation are  intact  EKG- SR with pac's and junctional escape beats at 60 bpm  Echo-1. The left ventricle has normal systolic function, with an ejection  fraction of 55-60%. The cavity size was normal. Left ventricular diastolic  Doppler parameters are consistent with impaired relaxation. Indeterminate  filling pressures.  2. The right ventricle has normal systolic function. The cavity was  normal. There is no increase in right ventricular wall thickness. Right  ventricular systolic pressure could not be assessed.  3. The mitral valve is abnormal. Mild thickening of the mitral valve  leaflet. There is mild mitral annular calcification present.  4. The aortic valve is abnormal. Mild calcification of the aortic valve.  Aortic valve regurgitation is trivial by color flow Doppler. Mild stenosis  of the aortic valve. Maximum velocity obtained in the apical view.  5. AV Mean Sys Grad:9.0 mmHg.  6. There is mild dilatation of the ascending aorta measuring 41 mm.   Zio patch-Preliminary Findings Patient had a min HR of 42 bpm, max HR of 139 bpm, and avg HR of 61 bpm. Predominant underlying rhythm was Sinus Rhythm. Bundle Branch Block/IVCD was present. 45 Supraventricular Tachycardia runs occurred, the run with the fastest interval lasting 16 beats with a max rate of 139 bpm, the longest lasting 16 beats with an avg rate of 98 bpm. Atrial Fibrillation occurred (2% burden), ranging from 58-137 bpm (avg of 99 bpm), the longest lasting 1 hour 24 mins with an avg rate of 99 bpm. Atrial Fibrillation was detected within +/- 45 seconds of symptomatic patient event(s). Isolated SVEs were frequent (11.1%, [id number]), SVE Couplets were rare (<1.0%, 3318), and SVE Triplets were rare (<1.0%, 145). Isolated VEs were rare (<1.0%), and no VE Couplets or VE Triplets were present.  Assessment and Plan: 1. Paroxysmal afib  Afib ablation one week ago afib episodes noted yesterday He was assured that this can be common  after ablation  He uses 30 mg Cardizem as needed  Back in rhythm today   2. CHA2DS2VASc of 3  Continue eliquis 5 mg bid  He would like to be considered  for Watchman when available   2. CAD   afib clinic as scheduled 7/15 for one month ablation f/u, sooner if needed   Butch Penny C. Phat Dalton, [facility] [address] [place], [place] [address] [phone]

## 2019-12-18 ENCOUNTER — Other Ambulatory Visit: Payer: Self-pay | Admitting: Internal Medicine

## 2019-12-20 NOTE — Telephone Encounter (Signed)
This is a A-Fib clinic pt and one of the CMA there D/C this medication by error. Does pt still suppose to be taking this medication pantoprazole 40 mg tablet? Please address

## 2019-12-22 ENCOUNTER — Encounter (HOSPITAL_COMMUNITY): Payer: Self-pay | Admitting: Nurse Practitioner

## 2019-12-22 NOTE — Progress Notes (Unsigned)
Watchman Referral Patient Data   Derrick Compton 84 y.o.    Referring Physician: Allred/Ayane Delancey/J. Gwenlyn Found  Referring Contact Information: [facility]     Type of AF:  Paroxysmal  First diagnosis of AF if known:  Prior AF ablation? Yes     CHA2DS2-VASc Score = 3  The patient's score is based upon: CHF History: 0 HTN History: 0 Age : 2 Diabetes History: 0 Stroke History: 0 Vascular Disease History: 1 Gender: 0       HAS-BLED Score HTN      = 0 Abnormal renal/liver    = 0  Stroke     = 0 Bleeding history / disposition  = 0 Labile INR    = 0  Elderly (>65)    = 1 Drugs or EtOH   = 0  HAS-BLED Score = 1  Yearly bleeding risk: 0=1.13, 1=1.02, 2=1.88, 3=3.74, 4=8.70, 5+=12.5    Prior blood thinner history with any adverse event included: On eliquis 5 mg bid, wants  to race cars and come off anticoagulation   Lifestyle/occupation (racecar driver, climber, etc)  If other, please describe:  none    Prior Axial Chest Imaging (CT/MRI)? (If yes, type and when?) Yes 09/12/19 and one is pending  Prior TEE? (If yes, when was most recent?) No    Most recent TTE 07/19/19 1. Left ventricular ejection fraction, by estimation, is 60 to 65%. The left ventricle has normal function. The left ventricle has no regional wall motion abnormalities. Left ventricular diastolic parameters are consistent with Grade I diastolic dysfunction (impaired relaxation). The average left ventricular global longitudinal strain is -20.8 %. 2. Right ventricular systolic function is normal. The right ventricular size is mildly enlarged. 3. The mitral valve is normal in structure and function. Trivial mitral valve regurgitation. No evidence of mitral stenosis. 4. The aortic valve has an indeterminant number of cusps. Aortic valve regurgitation is not visualized. No aortic stenosis is present. There is moderate thickening of the aortic valve. There is moderate calcification of the aortic  valve. Aortic valve mean gradient measures 7.8 mmHg. Aortic valve peak gradient measures 14.7 mmHg. Aortic valve area, by VTI measures 1.85 cm. 5. The inferior vena cava is normal in size with greater than 50% respiratory variability, suggesting right atrial pressure of 3 mmHg.   Patient's most recent Creatinine? Lab Results  Component Value Date   CREATININE 0.88 11/07/2019     Hx of ESRD on dialysis? No       [name] 12/22/19 11:10 AM

## 2019-12-26 ENCOUNTER — Other Ambulatory Visit: Payer: Self-pay | Admitting: Physician Assistant

## 2019-12-26 ENCOUNTER — Other Ambulatory Visit: Payer: Self-pay

## 2019-12-26 ENCOUNTER — Ambulatory Visit
Admission: RE | Admit: 2019-12-26 | Discharge: 2019-12-26 | Disposition: A | Discharge status: Home / Self Care | Payer: Medicare Other | Source: Ambulatory Visit | Attending: Physician Assistant | Admitting: Physician Assistant

## 2019-12-26 DIAGNOSIS — M25551 Pain in right hip: Secondary | ICD-10-CM

## 2020-01-02 DIAGNOSIS — N210 Calculus in bladder: Secondary | ICD-10-CM | POA: Insufficient documentation

## 2020-01-03 ENCOUNTER — Encounter: Payer: Self-pay | Admitting: Cardiovascular Disease

## 2020-01-03 ENCOUNTER — Other Ambulatory Visit: Payer: Self-pay

## 2020-01-03 ENCOUNTER — Ambulatory Visit (INDEPENDENT_AMBULATORY_CARE_PROVIDER_SITE_OTHER): Payer: Medicare Other | Admitting: Cardiovascular Disease

## 2020-01-03 VITALS — BP 116/60 | HR 71 | Ht 70.0 in | Wt 156.2 lb

## 2020-01-03 DIAGNOSIS — I2510 Atherosclerotic heart disease of native coronary artery without angina pectoris: Secondary | ICD-10-CM

## 2020-01-03 DIAGNOSIS — E782 Mixed hyperlipidemia: Secondary | ICD-10-CM | POA: Diagnosis not present

## 2020-01-03 DIAGNOSIS — I208 Other forms of angina pectoris: Secondary | ICD-10-CM | POA: Diagnosis not present

## 2020-01-03 DIAGNOSIS — I480 Paroxysmal atrial fibrillation: Secondary | ICD-10-CM

## 2020-01-03 DIAGNOSIS — I350 Nonrheumatic aortic (valve) stenosis: Secondary | ICD-10-CM

## 2020-01-03 NOTE — Progress Notes (Signed)
01/03/2020 Derrick Compton   [birth year]  [id number]  Primary Physician Alonna Buckler, MD Primary Cardiologist: Lorretta Harp MD Renae Gloss  HPI:  Derrick Compton is a 85 y.o.  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 He was referred by the [facility] clinic for cardiovascular evaluation because of proximity and progressive chest pain. I last saw him in the office 09/27/2019. His only cardiovascular risk factor is hyperlipidemia. He did have a coronary calcium score performed 10/04/15 which was 181 a subsequent GXT which was normal. He exercises frequently has noticed progressive substernal chest pain with running. I perform Myoview stress testing on 11/11/16 which was entirely normal 2-D echo performed 12/09/16 showed mild aortic stenosis. He studies continued symptoms I ordered a coronary CTA/FFR on 12/24/16 showed a significant lesion in a diagonal branch.  He underwent cardiac catheterization by myself 02/05/17 revealing a 90% ostial high first diagonal branch stenosis which came off orthogonal to the LAD making percutaneous intervention high risk. I do not think that the risk of intervention would be worth potential benefit of revascularization. The patient, because of his relative hypotension and bradycardia is not a good candidate for anginal medications.   He has developed A. fib seen on an event monitor 06/23/2019. He did see Dr. Rayann Heman in the clinic for evaluation of this and was placed on Eliquis. Over the last several months he is noticed increasing dyspnea on exertion as well as substernal chest pressure on exertion .  He also gets episodes related to fib several times a week with heart rates up to 150 which he can tell on his apple watch.  Due to these episodes he does get some substernal chest pain.  Recent coronary CTA performed 09/12/2019  revealed a coronary calcium score of 217 without significant CAD.  He saw Dr. Rayann Heman in the office 09/19/2019 2 again discussed A. fib ablation which now he wishes to pursue.  He underwent successful A. fib ablation by Dr. Rayann Heman 11/10/2019.  He remains on Eliquis oral anticoagulation and continues to maintain sinus rhythm.  He does get some effort angina when exercising since that time.  He did have a coronary CTA performed 09/12/2019 that only showed first diagonal branch ostial disease and otherwise no evidence of CAD, consistent with his heart cath performed 02/05/2017.   Current Meds  Medication Sig  . apixaban (ELIQUIS) 5 MG TABS tablet Take 1 tablet (5 mg total) by mouth 2 (two) times daily.  . ciprofloxacin (CIPRO) 500 MG tablet Take 500 mg by mouth 2 (two) times daily.  [name] diltiazem (CARDIZEM) 30 MG tablet Take 1 tablet every 4 hours AS NEEDED for AFIB heart rate >100  . LORazepam (ATIVAN) 0.5 MG tablet Take 1 tablet (0.5 mg total) by mouth every 8 (eight) hours.  . mirtazapine (REMERON) 30 MG tablet TAKE 1 TABLET BY MOUTH EVERYDAY AT BEDTIME  . Protein POWD Take 1 Scoop by mouth See admin instructions. Take monday- Friday vega essentials shake  . rosuvastatin (CRESTOR) 40 MG tablet Take 40 mg by mouth daily.      No Known Allergies  Social History   Socioeconomic History  . Marital status: Married    Spouse name: Not on file  . Number of children: Not on file  . Years of education: Not on file  . Highest education level: Not on file  Occupational History  .  Not on file  Tobacco Use  . Smoking status: Never Smoker  . Smokeless tobacco: Never Used  Substance and Sexual Activity  . Alcohol use: Yes    Alcohol/week: 2.0 standard drinks    Types: 2 Standard drinks or equivalent per week  . Drug use: No  . Sexual activity: Not on file  Other Topics Concern  . Not on file  Social History Narrative   Lives in [place] with wife.      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   Social Determinants of Health   Financial Resource Strain:   . Difficulty of Paying Living Expenses:   Food Insecurity:   . Worried About Charity fundraiser in the Last Year:   . Arboriculturist in the Last Year:   Transportation Needs:   . Film/video editor (Medical):   [name] Lack of Transportation (Non-Medical):   Physical Activity:   . Days of Exercise per Week:   . Minutes of Exercise per Session:   Stress:   . Feeling of Stress :   Social Connections:   . Frequency of Communication with Friends and Family:   . Frequency of Social Gatherings with Friends and Family:   . Attends Religious Services:   . Active Member of Clubs or Organizations:   . Attends Archivist Meetings:   [name] Marital Status:   Intimate Partner Violence:   . Fear of Current or Ex-Partner:   . Emotionally Abused:   [name] Physically Abused:   . Sexually Abused:      Review of Systems: General: negative for chills, fever, night sweats or weight changes.  Cardiovascular: negative for chest pain, dyspnea on exertion, edema, orthopnea, palpitations, paroxysmal nocturnal dyspnea or shortness of breath Dermatological: negative for rash Respiratory: negative for cough or wheezing Urologic: negative for hematuria Abdominal: negative for nausea, vomiting, diarrhea, bright red blood per rectum, melena, or hematemesis Neurologic: negative for visual changes, syncope, or dizziness All other systems reviewed and are otherwise negative except as noted above.    Blood pressure 116/60, pulse 71, height 5\' 10"  (1.778 m), weight 156 lb 3.2 oz (70.9 kg), SpO2 97 %.  General appearance: alert and no distress Neck: no adenopathy, no carotid bruit, no JVD, supple, symmetrical, trachea midline and thyroid not enlarged, symmetric, no tenderness/mass/nodules Lungs: clear to auscultation bilaterally Heart: regular rate and rhythm, S1, S2 normal, no murmur, click, rub or gallop Extremities:  extremities normal, atraumatic, no cyanosis or edema Pulses: 2+ and symmetric Skin: Skin color, texture, turgor normal. No rashes or lesions Neurologic: Alert and oriented X 3, normal strength and tone. Normal symmetric reflexes. Normal coordination and gait  EKG sinus rhythm at 71 with septal Q waves, left anterior fascicular block and occasional PVCs with sinus arrhythmia.  I personally reviewed this EKG.  ASSESSMENT AND PLAN:   Hyperlipidemia History of hyperlipidemia on statin therapy.  We will recheck a fasting lipid liver profile  Chest pain History of mild effort angina.  Patient is very active.  I did perform coronary angiography on him 02/05/2017 that showed calcified ostial first diagonal branch stenosis which I elected to treat medically.  He also had a coronary CTA performed 09/12/2019 which showed exactly the same anatomy.  Aortic stenosis, mild 2D echo performed 07/19/2019 showed normal LV systolic function with grade 1 diastolic dysfunction and mild aortic stenosis with a valve area of 1.85 cm and a peak gradient of 15 mmHg.  We  will repeat this in February of next year.  Paroxysmal atrial fibrillation (HCC) History of paroxysmal A. fib status post A. fib ablation by Dr. Rayann Heman 11/10/2019.  Remains in sinus rhythm on Eliquis oral anticoagulation which he strongly desires to get off of.  We did discuss discuss the watchman procedure and apparently he has an appointment with Dr. Quentin Ore in September to discuss this.      Lorretta Harp MD FACP,FACC,FAHA, [facility] 01/03/2020 10:40 AM

## 2020-01-03 NOTE — Assessment & Plan Note (Signed)
History of mild effort angina.  Patient is very active.  I did perform coronary angiography on him 02/05/2017 that showed calcified ostial first diagonal branch stenosis which I elected to treat medically.  He also had a coronary CTA performed 09/12/2019 which showed exactly the same anatomy.

## 2020-01-03 NOTE — Patient Instructions (Signed)
Medication Instructions:  Your physician recommends that you continue on your current medications as directed. Please refer to the Current Medication list given to you today.  *If you need a refill on your cardiac medications before your next appointment, please call your pharmacy*   Lab Work: Return for [place] (Lipid/Hepatic)-no appointment needed  If you have labs (blood work) drawn today and your tests are completely normal, you will receive your results only by: [name] MyChart Message (if you have MyChart) OR . A paper copy in the mail If you have any lab test that is abnormal or we need to change your treatment, we will call you to review the results.   Testing/Procedures: Echocardiogram due in February 2022   Follow-Up: At [facility], you and your health needs are our priority.  As part of our continuing mission to provide you with exceptional heart care, we have created designated Provider Care Teams.  These Care Teams include your primary Cardiologist (physician) and Advanced Practice Providers (APPs -  Physician Assistants and Nurse Practitioners) who all work together to provide you with the care you need, when you need it.  We recommend signing up for the patient portal called "MyChart".  Sign up information is provided on this After Visit Summary.  MyChart is used to connect with patients for Virtual Visits (Telemedicine).  Patients are able to view lab/test results, encounter notes, upcoming appointments, etc.  Non-urgent messages can be sent to your provider as well.   To learn more about what you can do with MyChart, go to [email or web address].    Your next appointment:   6 month(s)  The format for your next appointment:   In Person  Provider:   Quay Burow, MD

## 2020-01-03 NOTE — Assessment & Plan Note (Signed)
History of hyperlipidemia on statin therapy.  We will recheck a fasting lipid liver profile. 

## 2020-01-03 NOTE — Assessment & Plan Note (Signed)
History of paroxysmal A. fib status post A. fib ablation by Dr. Rayann Heman 11/10/2019.  Remains in sinus rhythm on Eliquis oral anticoagulation which he strongly desires to get off of.  We did discuss discuss the watchman procedure and apparently he has an appointment with Dr. Quentin Ore in September to discuss this.

## 2020-01-03 NOTE — Assessment & Plan Note (Signed)
2D echo performed 07/19/2019 showed normal LV systolic function with grade 1 diastolic dysfunction and mild aortic stenosis with a valve area of 1.85 cm and a peak gradient of 15 mmHg.  We will repeat this in February of next year.

## 2020-01-04 ENCOUNTER — Other Ambulatory Visit: Payer: Self-pay | Admitting: Psychiatry

## 2020-01-04 LAB — HEPATIC FUNCTION PANEL
ALT: 31 IU/L (ref 0–44)
AST: 21 IU/L (ref 0–40)
Albumin: 4.3 g/dL (ref 3.6–4.6)
Alkaline Phosphatase: 101 IU/L (ref 48–121)
Bilirubin Total: 0.5 mg/dL (ref 0.0–1.2)
Bilirubin, Direct: 0.13 mg/dL (ref 0.00–0.40)
Total Protein: 7 g/dL (ref 6.0–8.5)

## 2020-01-04 LAB — LIPID PANEL
Chol/HDL Ratio: 4.2 ratio (ref 0.0–5.0)
Cholesterol, Total: 187 mg/dL (ref 100–199)
HDL: 45 mg/dL (ref >39)
LDL Chol Calc (NIH): 123 mg/dL — ABNORMAL HIGH (ref 0–99)
Triglycerides: 105 mg/dL (ref 0–149)
VLDL Cholesterol Cal: 19 mg/dL (ref 5–40)

## 2020-01-09 ENCOUNTER — Ambulatory Visit (INDEPENDENT_AMBULATORY_CARE_PROVIDER_SITE_OTHER): Payer: Medicare Other | Admitting: Pharmacist

## 2020-01-09 ENCOUNTER — Other Ambulatory Visit: Payer: Self-pay

## 2020-01-09 DIAGNOSIS — I2510 Atherosclerotic heart disease of native coronary artery without angina pectoris: Secondary | ICD-10-CM | POA: Diagnosis not present

## 2020-01-09 DIAGNOSIS — E782 Mixed hyperlipidemia: Secondary | ICD-10-CM | POA: Diagnosis not present

## 2020-01-09 NOTE — Progress Notes (Signed)
Patient ID: Derrick Compton                 DOB: [birth year]                    MRN: [id number]     HPI: Derrick Compton is a 85 y.o. male patient referred to lipid clinic by Dr. Gwenlyn Found. PMH is significant for HLD, CAD with mild aortic stenosis, and atrial fibrillation s/p ablation on 11/10/19. Left heart catheterization on 02/05/17 showed 90% stenosis which came off orthogonal to the LAD - no intervention made given high risk. Coronary CTA on 09/12/19 was consistent with heart cath in 2018 and revealed coronary calcium score of 217. Dr. Gwenlyn Found referred him to lipid clinic for initiation of PCSK9 injections.  Patient arrives today to learn more about PCSK9 injections. He states that he lives a fairly healthy lifestyle. He eats a low-fat diet and exercises regularly by walking 5-6 miles when able and weightlifting three times weekly. He does not have a family history of cardiac disease that he is aware of. He has been tolerating his rosuvastatin for many years. He states that he tried another statin in the past but switched to rosuvastatin due to myalgias.  Current Medications: rosuvastatin 40mg  daily Intolerances: another statin that he cannot remember the name of (myalgias) Risk Factors: HLD LDL goal: < 70 mg/dL  Diet: Eats cheerios and bananas for breakfast, protein/blueberry smoothie daily, and [place] or PB&J sandwich with yogurt for dinner. Eats a lot of cashews. Does not eat out often.   Exercise: Former runner but atrial fibrillation has limited his running. Used to walk 5-6 miles daily before hurting his hip 2 weeks ago. Weightlifts 3 times weekly.  Family History: No family history of cardiac disease that he knows of  Social History: Never smoker  Labs: 01/04/20: TC 187, TG 105, HDL 45, LDL 123 (rosuvastatin 40mg  daily)  Past Medical History:  Diagnosis Date  . Anxiety   . BPH (benign prostatic hyperplasia)   . CAD (coronary artery disease)   . Chest pain   . Hyperlipidemia   .  Osteopenia   . Paroxysmal atrial fibrillation (HCC)     Current Outpatient Medications on File Prior to Visit  Medication Sig Dispense Refill  . apixaban (ELIQUIS) 5 MG TABS tablet Take 1 tablet (5 mg total) by mouth 2 (two) times daily. 60 tablet 11  . ciprofloxacin (CIPRO) 500 MG tablet Take 500 mg by mouth 2 (two) times daily.    [name] diltiazem (CARDIZEM) 30 MG tablet Take 1 tablet every 4 hours AS NEEDED for AFIB heart rate >100 45 tablet 1  . LORazepam (ATIVAN) 0.5 MG tablet Take 1 tablet (0.5 mg total) by mouth every 8 (eight) hours. 30 tablet 1  . mirtazapine (REMERON) 30 MG tablet TAKE 1 TABLET BY MOUTH EVERYDAY AT BEDTIME 30 tablet 2  . Protein POWD Take 1 Scoop by mouth See admin instructions. Take monday- Friday vega essentials shake    . rosuvastatin (CRESTOR) 40 MG tablet Take 40 mg by mouth daily.   3   No current facility-administered medications on file prior to visit.    No Known Allergies  Assessment/Plan:  1. Hyperlipidemia - LDL is elevated and above goal of <70 mg/dL on rosuvastatin 40mg  daily. Will submit prior authorization for Repatha 140 mg injections every 2 weeks. Discussed other cholesterol-lowering medications such as ezetimibe and bempedoic acid (Nexletol), however these medication will likely not get his LDL to  goal. Recommended patient to continue his regular exercise and to restart walking 5-6 miles daily as his hip pain allows. Encouraged patient to continue his heart-healthy, low-fat diet. Patient aware of $34 monthly copay. Will send in [place] Rx to [facility] and schedule fasting labs once prior authorization is approved.   Richardine Service, PharmD PGY2 Cardiology Pharmacy Resident

## 2020-01-09 NOTE — Patient Instructions (Addendum)
It was nice to meet you today!   Your LDL is 123 and your goal is < 70   I will submit information to your insurance to see if they will cover Repatha injections. This medication is stored in the fridge, is given every 2 weeks into the fatty tissue of your stomach, and lowers your LDL cholesterol by 60%   We will plan to recheck your cholesterol 6-8 weeks after starting the injections  Please give [place] a call at [phone] if you have any questions.

## 2020-01-12 ENCOUNTER — Telehealth: Payer: Self-pay | Admitting: Pharmacist

## 2020-01-12 MED ORDER — REPATHA SURECLICK 140 MG/ML ~~LOC~~ SOAJ: 1.0000 "pen " | SUBCUTANEOUS | 11 refills | Status: DC

## 2020-01-12 NOTE — Telephone Encounter (Signed)
Repatha approved through 07/11/20. Rx sent to [facility]. I called patient and made him aware that it was approved and Rx sent. He would like to scheduled labs closer to the time. Advised to call us back if he has any issues.

## 2020-01-30 NOTE — Progress Notes (Signed)
Electrophysiology Office Note:    Date:  01/31/2020   ID:  HALLIE ISHIDA, DOB 04/04/1935, MRN [id number]  PCP:  Alonna Buckler, MD  [facility] Cardiologist:  Quay Burow, MD  [facility] Electrophysiologist: [name], MD  Referring MD: Alonna Buckler, MD   Chief Complaint: Watchman evaluatoin  History of Present Illness:    Derrick Compton is a 85 y.o. male with a hx of coronary artery disease with a first diagonal 90% obstruction not amenable to revascularization, atrial fibrillation diagnosed in January 2021 status post A. fib ablation on November 10, 2019 by Dr. Rayann Heman.  He is maintained on Eliquis for this.  He has a strong desire to get off of the anticoagulant given that he participates in racecar driving. He also hikes/climbs and had a recent fall with injury to his right hip. He receives some of his care at [place].  Past Medical History:  Diagnosis Date  . Anxiety   . BPH (benign prostatic hyperplasia)   . CAD (coronary artery disease)   . Chest pain   . Hyperlipidemia   . Osteopenia   . Paroxysmal atrial fibrillation [facility])     Past Surgical History:  Procedure Laterality Date  . ATRIAL FIBRILLATION ABLATION N/A 11/10/2019   Procedure: ATRIAL FIBRILLATION ABLATION;  Surgeon: [name], MD;  Location: [place] [facility];  Service: Cardiovascular;  Laterality: N/A;  . LEFT HEART CATH AND CORONARY ANGIOGRAPHY N/A 02/05/2017   Procedure: LEFT HEART CATH AND CORONARY ANGIOGRAPHY;  Surgeon: Lorretta Harp, MD;  Location: [place] [facility];  Service: Cardiovascular;  Laterality: N/A;    Current Medications: Current Meds  Medication Sig  . apixaban (ELIQUIS) 5 MG TABS tablet Take 1 tablet (5 mg total) by mouth 2 (two) times daily.  . ciprofloxacin (CIPRO) 500 MG tablet Take 500 mg by mouth 2 (two) times daily.  [name] diltiazem (CARDIZEM) 30 MG tablet Take 1 tablet every 4 hours AS NEEDED for AFIB heart rate >100  . Evolocumab (REPATHA SURECLICK) 627 MG/ML  SOAJ Inject 1 pen into the skin every 14 (fourteen) days.  [name] LORazepam (ATIVAN) 0.5 MG tablet Take 1 tablet (0.5 mg total) by mouth every 8 (eight) hours.  . mirtazapine (REMERON) 30 MG tablet TAKE 1 TABLET BY MOUTH EVERYDAY AT BEDTIME  . Protein POWD Take 1 Scoop by mouth See admin instructions. Take monday- Friday vega essentials shake  . rosuvastatin (CRESTOR) 40 MG tablet Take 40 mg by mouth daily.      Allergies:   Patient has no known allergies.   Social History   Socioeconomic History  . Marital status: Married    Spouse name: Not on file  . Number of children: Not on file  . Years of education: Not on file  . Highest education level: Not on file  Occupational History  . Not on file  Tobacco Use  . Smoking status: Never Smoker  . Smokeless tobacco: Never Used  Substance and Sexual Activity  . Alcohol use: Yes    Alcohol/week: 2.0 standard drinks    Types: 2 Standard drinks or equivalent per week  . Drug use: No  . Sexual activity: Not on file  Other Topics Concern  . Not on file  Social History Narrative   Lives in [place] with wife.      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   Social Determinants of Health   Financial Resource Strain:   . Difficulty of Paying Living  Expenses: Not on file  Food Insecurity:   . Worried About Charity fundraiser in the Last Year: Not on file  . Ran Out of Food in the Last Year: Not on file  Transportation Needs:   . Lack of Transportation (Medical): Not on file  . Lack of Transportation (Non-Medical): Not on file  Physical Activity:   . Days of Exercise per Week: Not on file  . Minutes of Exercise per Session: Not on file  Stress:   . Feeling of Stress : Not on file  Social Connections:   . Frequency of Communication with Friends and Family: Not on file  . Frequency of Social Gatherings with Friends and Family: Not on file  . Attends Religious Services: Not on file  . Active Member of Clubs or  Organizations: Not on file  . Attends Archivist Meetings: Not on file  . Marital Status: Not on file     Family History: The patient's family history is not on file.  ROS:   Please see the history of present illness.    All other systems reviewed and are negative.  EKGs/Labs/Other Studies Reviewed:    The following studies were reviewed today: Echo, ECG  07/19/2019 TTE 1. Left ventricular ejection fraction, by estimation, is 60 to 65%. The  left ventricle has normal function. The left ventricle has no regional  wall motion abnormalities. Left ventricular diastolic parameters are  consistent with Grade I diastolic  dysfunction (impaired relaxation). The average left ventricular global  longitudinal strain is -20.8 %.  2. Right ventricular systolic function is normal. The right ventricular  size is mildly enlarged.  3. The mitral valve is normal in structure and function. Trivial mitral  valve regurgitation. No evidence of mitral stenosis.  4. The aortic valve has an indeterminant number of cusps. Aortic valve  regurgitation is not visualized. No aortic stenosis is present. There is  moderate thickening of the aortic valve. There is moderate calcification  of the aortic valve. Aortic valve  mean gradient measures 7.8 mmHg. Aortic valve peak gradient measures 14.7  mmHg. Aortic valve area, by VTI measures 1.85 cm.  5. The inferior vena cava is normal in size with greater than 50%  respiratory variability, suggesting right atrial pressure of 3 mmHg.   09/12/2019 CT Coronary personally reviewed by me Left atrial appendage is large and has a windsock appearance.  There are pectinate present with a somewhat large pectinate/ridge at the level of the circumflex. 1. Coronary calcium score of 217. This was 65 percentile for age and sex matched control. 3. Mild CAD with 25-49% stenosis in the ostial D1; mimimal disease elsewhere; CADRADS-2.  EKG:  The ekg ordered today  demonstrates sinus with bigeminal PACs (slight different in P wave morphology but very close to sinus origin).  Recent Labs: 06/15/2019: Magnesium 2.3; TSH 2.200 11/07/2019: BUN 16; Creatinine, Ser 0.88; Hemoglobin 13.4; Platelets 237; Potassium 4.1; Sodium 140 01/04/2020: ALT 31  Recent Lipid Panel    Component Value Date/Time   CHOL 187 01/04/2020 0903   TRIG 105 01/04/2020 0903   HDL 45 01/04/2020 0903   CHOLHDL 4.2 01/04/2020 0903   CHOLHDL 3.1 02/04/2011 1509   VLDL 20 02/04/2011 1509   LDLCALC 123 (H) 01/04/2020 0903    Physical Exam:    VS:  BP 108/60   Pulse 68   Ht 5\' 10"  (1.778 m)   Wt 156 lb (70.8 kg)   SpO2 95%   BMI 22.38 kg/m  Wt Readings from Last 3 Encounters:  01/31/20 156 lb (70.8 kg)  01/03/20 156 lb 3.2 oz (70.9 kg)  12/08/19 157 lb 3.2 oz (71.3 kg)     GEN: Well nourished, well developed in no acute distress HEENT: Normal NECK: No JVD; No carotid bruits LYMPHATICS: No lymphadenopathy CARDIAC: RRR, no murmurs, rubs, gallops RESPIRATORY:  Clear to auscultation without rales, wheezing or rhonchi  ABDOMEN: Soft, non-tender, non-distended MUSCULOSKELETAL:  No edema; No deformity  SKIN: Warm and dry NEUROLOGIC:  Alert and oriented x 3 PSYCHIATRIC:  Normal affect   ASSESSMENT:    1. Paroxysmal atrial fibrillation (HCC)   2. Coronary artery disease involving native coronary artery of native heart with other form of angina pectoris ([place])    PLAN:    In order of problems listed above:  1. Paroxysmal Atrial Fibrillation The patient has paroxysmal atrial fibrillation maintained on Eliquis.  He is in sinus today with bigeminal PACs.  His CHA2DS2-VASc is 4.  He has tolerated the Eliquis but he has a strong desire to be off of the Eliquis given his participation in racecar driving, hiking, etc. I think he is an excellent candidate for a Watchman implant. He will touch base with [place] near the end of 2021 or beginning 2022 with scheduling, etc. He wants to  touch base with Dr Rayann Heman first re: results of his ablation.   2. CAD First diagonal 90% obstruction not amenable to revascularization given its takeoff from the LAD.  Continue rosuvastatin.  Medication Adjustments/Labs and Tests Ordered: Current medicines are reviewed at length with the patient today.  Concerns regarding medicines are outlined above.  Orders Placed This Encounter  Procedures  . EKG 12-Lead   No orders of the defined types were placed in this encounter.   Patient Instructions  Medication Instructions:  Your physician recommends that you continue on your current medications as directed. Please refer to the Current Medication list given to you today.  Labwork: None ordered.  Testing/Procedures: None ordered.  Follow-Up: Your physician wants you to follow-up in: as needed with Dr. Quentin Ore.     Any Other Special Instructions Will Be Listed Below (If Applicable).  If you need a refill on your cardiac medications before your next appointment, please call your pharmacy.    Left Atrial Appendage Closure Device Implantation  Left atrial appendage (LAA) closure device implantation is a procedure that is done to place a small device in the LAA of the heart. The left atrium is one of the heart's two upper chambers, and the LAA is a small sac in the wall of the left atrium. The device closes the LAA. This procedure can help prevent a stroke caused by atrial fibrillation. Atrial fibrillation is a type of irregular or rapid heartbeat (arrhythmia). When the heart does not beat normally and the heartbeat is irregular, there is an increased risk of blood clots and stroke. A blood clot can form in the LAA.      Signed, Lars Mage, MD, [facility]  01/31/2020 9:48 AM    Electrophysiology [facility]

## 2020-01-31 ENCOUNTER — Ambulatory Visit (INDEPENDENT_AMBULATORY_CARE_PROVIDER_SITE_OTHER): Payer: Medicare Other | Admitting: Cardiology

## 2020-01-31 ENCOUNTER — Encounter: Payer: Self-pay | Admitting: Cardiology

## 2020-01-31 ENCOUNTER — Other Ambulatory Visit: Payer: Self-pay

## 2020-01-31 VITALS — BP 108/60 | HR 68 | Ht 70.0 in | Wt 156.0 lb

## 2020-01-31 DIAGNOSIS — I480 Paroxysmal atrial fibrillation: Secondary | ICD-10-CM

## 2020-01-31 DIAGNOSIS — I25118 Atherosclerotic heart disease of native coronary artery with other forms of angina pectoris: Secondary | ICD-10-CM | POA: Diagnosis not present

## 2020-01-31 NOTE — Addendum Note (Signed)
Addended by: Lars Mage T on: 01/31/2020 10:24 AM   Modules accepted: Level of Service

## 2020-01-31 NOTE — Patient Instructions (Addendum)
Medication Instructions:  Your physician recommends that you continue on your current medications as directed. Please refer to the Current Medication list given to you today.  Labwork: None ordered.  Testing/Procedures: None ordered.  Follow-Up: Your physician wants you to follow-up in: as needed with Dr. Quentin Ore.     Any Other Special Instructions Will Be Listed Below (If Applicable).  If you need a refill on your cardiac medications before your next appointment, please call your pharmacy.    Left Atrial Appendage Closure Device Implantation  Left atrial appendage (LAA) closure device implantation is a procedure that is done to place a small device in the LAA of the heart. The left atrium is one of the heart's two upper chambers, and the LAA is a small sac in the wall of the left atrium. The device closes the LAA. This procedure can help prevent a stroke caused by atrial fibrillation. Atrial fibrillation is a type of irregular or rapid heartbeat (arrhythmia). When the heart does not beat normally and the heartbeat is irregular, there is an increased risk of blood clots and stroke. A blood clot can form in the LAA.

## 2020-02-10 ENCOUNTER — Telehealth: Payer: Self-pay

## 2020-02-10 NOTE — Telephone Encounter (Signed)
Spoke with Derrick Compton regarding virtual appt on 02/13/20. Derrick Compton stated he would rather Dr. Rayann Heman call him on the phone for his appt. Derrick Compton agreed and confirmed virtual appt.

## 2020-02-13 ENCOUNTER — Other Ambulatory Visit: Payer: Self-pay

## 2020-02-13 ENCOUNTER — Telehealth (INDEPENDENT_AMBULATORY_CARE_PROVIDER_SITE_OTHER): Payer: Medicare Other | Admitting: Internal Medicine

## 2020-02-13 DIAGNOSIS — I2510 Atherosclerotic heart disease of native coronary artery without angina pectoris: Secondary | ICD-10-CM | POA: Diagnosis not present

## 2020-02-13 DIAGNOSIS — D6869 Other thrombophilia: Secondary | ICD-10-CM

## 2020-02-13 DIAGNOSIS — I480 Paroxysmal atrial fibrillation: Secondary | ICD-10-CM

## 2020-02-13 NOTE — Progress Notes (Signed)
Electrophysiology TeleHealth Note  Due to national recommendations of social distancing due to [place] 19, an audio telehealth visit is felt to be most appropriate for this patient at this time.  Verbal consent was obtained by me for the telehealth visit today.  The patient does not have capability for a virtual visit.  A phone visit is therefore required today.   Date:  02/13/2020   ID:  Derrick Compton, DOB December 17, 1934, MRN [id number]  Location: patient's home  Provider location:  [place]  Evaluation Performed: Follow-up visit  PCP:  Alonna Buckler, MD   Electrophysiologist:  Dr Rayann Heman  Chief Complaint:  palpitations  History of Present Illness:    Derrick Compton is a 85 y.o. male who presents via telehealth conferencing today.  Since his ablation, the patient reports doing very well.  His afib has improved.  He has very rare palpitations.   Denies procedure related comlications. Today, he denies symptoms of chest pain, shortness of breath,  lower extremity edema, dizziness, presyncope, or syncope.  The patient is otherwise without complaint today.     Past Medical History:  Diagnosis Date  . Anxiety   . BPH (benign prostatic hyperplasia)   . CAD (coronary artery disease)   . Chest pain   . Hyperlipidemia   . Osteopenia   . Paroxysmal atrial fibrillation [facility])     Past Surgical History:  Procedure Laterality Date  . ATRIAL FIBRILLATION ABLATION N/A 11/10/2019   Procedure: ATRIAL FIBRILLATION ABLATION;  Surgeon: [name], MD;  Location: [place] [facility];  Service: Cardiovascular;  Laterality: N/A;  . LEFT HEART CATH AND CORONARY ANGIOGRAPHY N/A 02/05/2017   Procedure: LEFT HEART CATH AND CORONARY ANGIOGRAPHY;  Surgeon: Lorretta Harp, MD;  Location: [place] [facility];  Service: Cardiovascular;  Laterality: N/A;    Current Outpatient Medications  Medication Sig Dispense Refill  . apixaban (ELIQUIS) 5 MG TABS tablet Take 1 tablet (5 mg total) by mouth 2  (two) times daily. 60 tablet 11  . ciprofloxacin (CIPRO) 500 MG tablet Take 500 mg by mouth 2 (two) times daily.    [name] diltiazem (CARDIZEM) 30 MG tablet Take 1 tablet every 4 hours AS NEEDED for AFIB heart rate >100 45 tablet 1  . Evolocumab (REPATHA SURECLICK) 449 MG/ML SOAJ Inject 1 pen into the skin every 14 (fourteen) days. 2 mL 11  . LORazepam (ATIVAN) 0.5 MG tablet Take 1 tablet (0.5 mg total) by mouth every 8 (eight) hours. 30 tablet 1  . mirtazapine (REMERON) 30 MG tablet TAKE 1 TABLET BY MOUTH EVERYDAY AT BEDTIME 30 tablet 2  . Protein POWD Take 1 Scoop by mouth See admin instructions. Take monday- Friday vega essentials shake    . rosuvastatin (CRESTOR) 40 MG tablet Take 40 mg by mouth daily.   3   No current facility-administered medications for this visit.    Allergies:   Patient has no known allergies.   Social History:  The patient  reports that he has never smoked. He has never used smokeless tobacco. He reports current alcohol use of about 2.0 standard drinks of alcohol per week. He reports that he does not use drugs.   ROS:  Please see the history of present illness.   All other systems are personally reviewed and negative.    Exam:    Vital Signs:  There were no vitals taken for this visit.  Well sounding, alert and conversant   Labs/Other Tests and Data Reviewed:  Recent Labs: 06/15/2019: Magnesium 2.3; TSH 2.200 11/07/2019: BUN 16; Creatinine, Ser 0.88; Hemoglobin 13.4; Platelets 237; Potassium 4.1; Sodium 140 01/04/2020: ALT 31   Wt Readings from Last 3 Encounters:  01/31/20 156 lb (70.8 kg)  01/03/20 156 lb 3.2 oz (70.9 kg)  12/08/19 157 lb 3.2 oz (71.3 kg)       ASSESSMENT & PLAN:    1.  Paroxysmal atrial fibrillation Doing very well post ablation off AAD therapy chads2vasc score is 3.  He has been evaluated for Watchman.  The patient would prefer a more conservative approach. We discussed other options for anticoagulation monitoring, including ILR  for long term monitoring with consideration of stopping [place] if he did not have afib detected as an option.  We also discussed REACT AF trial as a consideration should this study become available to [place] in the next few months.  He would prefer these options over Watchman. Given low chads2vasc score and overall risk/benefit ratio, I would advise that we continue our current strategy for now and continue to reassess depending on his response to ablation. No changes for now  2. CAD No ischemic symptoms  Risks, benefits and potential toxicities for medications prescribed and/or refilled reviewed with patient today.   Follow-up:  3 months with me   Patient Risk:  after full review of this patients clinical status, I feel that they are at moderate risk at this time.  Today, I have spent 15 minutes with the patient with telehealth technology discussing arrhythmia management .    Army Fossa, MD  02/13/2020 11:39 AM     [facility] [address] [place] [address] [phone] (office) [phone] (fax)

## 2020-03-16 ENCOUNTER — Telehealth: Payer: Self-pay | Admitting: *Deleted

## 2020-03-16 NOTE — Telephone Encounter (Signed)
Patient left a message on my voicemail that he wanted to try a monitor for his heart. He states his heart rate has been going up and down from 150- 170 depending on his activity. Please call patient.

## 2020-03-19 ENCOUNTER — Other Ambulatory Visit (HOSPITAL_COMMUNITY): Payer: Self-pay | Admitting: Nurse Practitioner

## 2020-03-19 ENCOUNTER — Telehealth: Payer: Self-pay | Admitting: *Deleted

## 2020-03-19 NOTE — Telephone Encounter (Signed)
Left message to call back or mychart.

## 2020-03-19 NOTE — Telephone Encounter (Signed)
-----   Message from [name], MD sent at 03/18/2020 11:02 AM EDT ----- Regarding: FW: MONITOR I have spoken with him previously about loop recorder implant.  Please call and see if this is what he has in mind.  If so, ok to schedule in office ILR implantation.  ----- Message ----- From: Freada Bergeron, CMA Sent: 03/16/2020   3:08 PM EDT To: [name], MD, Damian Leavell, RN Subject: MONITOR                                        Patient called today says he wants to try a monitor for his heart. Please call patient. Thanks, Gae Bon

## 2020-03-19 NOTE — Telephone Encounter (Signed)
Patient called back in. Spoke about the implanted loop recorder. Patient wishes to be set up with an appointment with Dr. Rayann Heman to discuss.

## 2020-03-28 ENCOUNTER — Encounter: Payer: Self-pay | Admitting: Internal Medicine

## 2020-03-28 ENCOUNTER — Ambulatory Visit (INDEPENDENT_AMBULATORY_CARE_PROVIDER_SITE_OTHER): Payer: Medicare Other | Admitting: Internal Medicine

## 2020-03-28 ENCOUNTER — Other Ambulatory Visit: Payer: Self-pay

## 2020-03-28 VITALS — BP 102/60 | HR 58 | Ht 70.0 in | Wt 154.2 lb

## 2020-03-28 DIAGNOSIS — I480 Paroxysmal atrial fibrillation: Secondary | ICD-10-CM

## 2020-03-28 DIAGNOSIS — I2510 Atherosclerotic heart disease of native coronary artery without angina pectoris: Secondary | ICD-10-CM

## 2020-03-28 HISTORY — PX: OTHER SURGICAL HISTORY: SHX169

## 2020-03-28 NOTE — Progress Notes (Signed)
PCP: Alonna Buckler, MD   Primary EP: Dr Gigi Gin MAHDI FRYE is a 85 y.o. male who presents today for routine electrophysiology followup.  Since last being seen in our clinic, the patient reports doing very well.  He has occasional palpitations and worries about possible afib recurrence. Today, he denies symptoms of  chest pain, shortness of breath,  lower extremity edema, dizziness, presyncope, or syncope.  The patient is otherwise without complaint today.   Past Medical History:  Diagnosis Date  . Anxiety   . BPH (benign prostatic hyperplasia)   . CAD (coronary artery disease)   . Chest pain   . Hyperlipidemia   . Osteopenia   . Paroxysmal atrial fibrillation [facility])    Past Surgical History:  Procedure Laterality Date  . ATRIAL FIBRILLATION ABLATION N/A 11/10/2019   Procedure: ATRIAL FIBRILLATION ABLATION;  Surgeon: [name], MD;  Location: [place] [facility] LAB;  Service: Cardiovascular;  Laterality: N/A;  . LEFT HEART CATH AND CORONARY ANGIOGRAPHY N/A 02/05/2017   Procedure: LEFT HEART CATH AND CORONARY ANGIOGRAPHY;  Surgeon: Lorretta Harp, MD;  Location: [place] [facility];  Service: Cardiovascular;  Laterality: N/A;    ROS- all systems are reviewed and negatives except as per HPI above  Current Outpatient Medications  Medication Sig Dispense Refill  . apixaban (ELIQUIS) 5 MG TABS tablet Take 1 tablet (5 mg total) by mouth 2 (two) times daily. 60 tablet 11  . diltiazem (CARDIZEM) 30 MG tablet TAKE 1 TABLET EVERY 4 HOURS AS NEEDED FOR AFIB HEART RATE >100 45 tablet 3  . Evolocumab (REPATHA SURECLICK) 979 MG/ML SOAJ Inject 1 pen into the skin every 14 (fourteen) days. 2 mL 11  . LORazepam (ATIVAN) 0.5 MG tablet Take 1 tablet (0.5 mg total) by mouth every 8 (eight) hours. 30 tablet 1  . mirtazapine (REMERON) 30 MG tablet TAKE 1 TABLET BY MOUTH EVERYDAY AT BEDTIME 30 tablet 2  . Protein POWD Take 1 Scoop by mouth See admin instructions. Take monday- Friday vega essentials  shake    . rosuvastatin (CRESTOR) 40 MG tablet Take 40 mg by mouth daily.   3  . sertraline (ZOLOFT) 100 MG tablet Take 100 mg by mouth daily.     No current facility-administered medications for this visit.    Physical Exam: Vitals:   03/28/20 1138  BP: 102/60  Pulse: (!) 58  SpO2: 98%  Weight: 154 lb 3.2 oz (69.9 kg)  Height: 5\' 10"  (1.778 m)    GEN- The patient is well appearing, alert and oriented x 3 today.   Head- normocephalic, atraumatic Eyes-  Sclera clear, conjunctiva pink Ears- hearing intact Oropharynx- clear Lungs- Clear to ausculation bilaterally, normal work of breathing Heart- Regular rate and rhythm, no murmurs, rubs or gallops, PMI not laterally displaced GI- soft, NT, ND, + BS Extremities- no clubbing, cyanosis, or edema  Wt Readings from Last 3 Encounters:  03/28/20 154 lb 3.2 oz (69.9 kg)  01/31/20 156 lb (70.8 kg)  01/03/20 156 lb 3.2 oz (70.9 kg)    Assessment and Plan:  1. Paroxysmal atrial fibrillation He has done well post ablation off AAD therapy.  He has palpitations of unclear etiology and worries about possible AF recurrence.  I would advise ILR to further evaluate his palpitations.  His chads2vasc score is 3.  If he has no afib, we may be able to discontinue [place] [employer] therapy.  I would therefore advise implantation of an implantable loop recorder for long term arrhythmia  monitoring.  Risks and benefits to ILR were discussed at length with the patient today, including but not limited to risks of bleeding and infection.  Extensive device education was performed.  Remote monitoring was also discussed at length today.  The patient understands and wishes to proceed.  We will proceed at this time with ILR implantation.  2. CAD No ischemic symptoms No changes  [name] MD, [facility] 03/28/2020 12:04 PM      DESCRIPTION OF PROCEDURE:  Informed written consent was obtained.  The patient required no sedation for the procedure today.  The patients left  chest was prepped and draped. Mapping over the patient's chest was performed to identify the appropriate ILR site.  This area was found to be the left parasternal region over the 3rd-4th intercostal space.  The skin overlying this region was infiltrated with lidocaine for local analgesia.  A 0.5-cm incision was made at the implant site.  A subcutaneous ILR pocket was fashioned using a combination of sharp and blunt dissection.  A Medtronic Reveal Linq model LNQ 22 (SN [id number] ) implantable loop recorder was then placed into the pocket R waves were very prominent and measured > 0.2 mV. EBL<1 ml.  Steri- Strips and a sterile dressing were then applied.  There were no early apparent complications.     CONCLUSIONS:   1. Successful implantation of a Medtronic Reveal LINQ implantable loop recorder for evaluation of palpitations and afib management post ablation  2. No early apparent complications.   Return in 3 months  [name] MD, [facility] 03/28/2020 12:04 PM

## 2020-03-28 NOTE — Patient Instructions (Addendum)
Medication Instructions:  Your physician recommends that you continue on your current medications as directed. Please refer to the Current Medication list given to you today.  Labwork: None ordered.  Testing/Procedures: None ordered.  Your physician wants you to follow-up in: 06/27/20 at 2:15 with Dr. Rayann Heman.    Implantable Loop Recorder Placement, Care After This sheet gives you information about how to care for yourself after your procedure. Your health care provider may also give you more specific instructions. If you have problems or questions, contact your health care provider. What can I expect after the procedure? After the procedure, it is common to have:  Soreness or discomfort near the incision.  Some swelling or bruising near the incision.  Follow these instructions at home: Incision care  1.  Leave your outer dressing on for 24 hours.  After 24 hours you can remove your outer dressing and shower. 2. Leave adhesive strips in place. These skin closures may need to stay in place for 1-2 weeks. If adhesive strip edges start to loosen and curl up, you may trim the loose edges.  You may remove the strips if they have not fallen off after 2 weeks. 3. Check your incision area every day for signs of infection. Check for: a. Redness, swelling, or pain. b. Fluid or blood. c. Warmth. d. Pus or a bad smell. 4. Do not take baths, swim, or use a hot tub until your incision is completely healed. 5. If your wound site starts to bleed apply pressure.      If you have any questions/concerns please call the device clinic at [phone].  Activity  Return to your normal activities.  General instructions  Follow instructions from your health care provider about how to manage your implantable loop recorder and transmit the information. Learn how to activate a recording if this is necessary for your type of device.  Do not go through a metal detection gate, and do not let someone hold  a metal detector over your chest. Show your ID card.  Do not have an MRI unless you check with your health care provider first.  Take over-the-counter and prescription medicines only as told by your health care provider.  Keep all follow-up visits as told by your health care provider. This is important. Contact a health care provider if:  You have redness, swelling, or pain around your incision.  You have a fever.  You have pain that is not relieved by your pain medicine.  You have triggered your device because of fainting (syncope) or because of a heartbeat that feels like it is racing, slow, fluttering, or skipping (palpitations). Get help right away if you have:  Chest pain.  Difficulty breathing. Summary  After the procedure, it is common to have soreness or discomfort near the incision.  Change your dressing as told by your health care provider.  Follow instructions from your health care provider about how to manage your implantable loop recorder and transmit the information.  Keep all follow-up visits as told by your health care provider. This is important. This information is not intended to replace advice given to you by your health care provider. Make sure you discuss any questions you have with your health care provider. Document Released: 04/23/2015 Document Revised: 06/27/2017 Document Reviewed: 06/27/2017 Elsevier Patient Education  2020 [employer].

## 2020-03-29 ENCOUNTER — Telehealth: Payer: Self-pay

## 2020-03-29 DIAGNOSIS — E782 Mixed hyperlipidemia: Secondary | ICD-10-CM

## 2020-03-29 NOTE — Telephone Encounter (Signed)
Called and scheduled lipid labs for tomorrow he stated that he would come to [facility] office

## 2020-03-29 NOTE — Telephone Encounter (Signed)
-----   Message from Ramond Dial, [place] sent at 03/29/2020  8:52 AM EDT ----- He only needed labs- not an appointment ----- Message ----- From: Allean Found, CMA Sent: 03/29/2020   8:30 AM EDT To: Ramond Dial, RPH-CPP  done ----- Message ----- From: Ramond Dial, RPH-CPP Sent: 03/29/2020   8:19 AM EDT To: Allean Found, CMA  Please set him up for lipids/lft. Pt uses both [facility] and [facility]- just ask which he prefers. thank ----- Message ----- From: Ramond Dial, RPH-CPP Sent: 03/29/2020 To: Ramond Dial, RPH-CPP  Set up labs- [place] pt

## 2020-03-30 LAB — LIPID PANEL
Chol/HDL Ratio: 2 ratio (ref 0.0–5.0)
Cholesterol, Total: 101 mg/dL (ref 100–199)
HDL: 50 mg/dL (ref >39)
LDL Chol Calc (NIH): 36 mg/dL (ref 0–99)
Triglycerides: 74 mg/dL (ref 0–149)
VLDL Cholesterol Cal: 15 mg/dL (ref 5–40)

## 2020-04-04 ENCOUNTER — Telehealth: Payer: Self-pay | Admitting: Internal Medicine

## 2020-04-04 NOTE — Telephone Encounter (Signed)
FYI - MR placed order on 05/18/19 for pt to have Chest CT in 1 year.  I called GI and scheduled CT.  I called pt to give him appt info and he stated he is in [place] and has already had CT at [facility].  I told him I would send a message to MR to make him aware.  I canceled CT I had scheduled for him.

## 2020-04-05 DIAGNOSIS — R918 Other nonspecific abnormal finding of lung field: Secondary | ICD-10-CM | POA: Insufficient documentation

## 2020-04-05 NOTE — Telephone Encounter (Signed)
Ok thanks. He can schedule followup with me when he come back to [facility]

## 2020-04-09 NOTE — Telephone Encounter (Signed)
Derrick Compton detection is off on ILR, therefore any low heart rate episodes have not been stored.  Pt has not had any episodes of any recorded yet.  I am including trend data as well which does show the Histogram with Hrs in the 30s.

## 2020-04-11 ENCOUNTER — Ambulatory Visit: Payer: Medicare Other

## 2020-04-13 DIAGNOSIS — R0602 Shortness of breath: Secondary | ICD-10-CM | POA: Insufficient documentation

## 2020-05-01 ENCOUNTER — Ambulatory Visit (INDEPENDENT_AMBULATORY_CARE_PROVIDER_SITE_OTHER): Payer: Medicare Other

## 2020-05-01 DIAGNOSIS — I480 Paroxysmal atrial fibrillation: Secondary | ICD-10-CM | POA: Diagnosis not present

## 2020-05-01 LAB — CUP PACEART REMOTE DEVICE CHECK
Date Time Interrogation Session: 20211207063439
Implantable Pulse Generator Implant Date: 20211103

## 2020-05-11 NOTE — Progress Notes (Signed)
Carelink Summary Report / Loop Recorder 

## 2020-05-14 ENCOUNTER — Other Ambulatory Visit: Payer: Medicare Other

## 2020-05-25 ENCOUNTER — Other Ambulatory Visit: Payer: Self-pay | Admitting: Psychiatry

## 2020-05-25 DIAGNOSIS — F331 Major depressive disorder, recurrent, moderate: Secondary | ICD-10-CM

## 2020-05-28 NOTE — Telephone Encounter (Signed)
Call to RS

## 2020-05-30 NOTE — Telephone Encounter (Signed)
Left message to call back  

## 2020-06-01 ENCOUNTER — Ambulatory Visit (INDEPENDENT_AMBULATORY_CARE_PROVIDER_SITE_OTHER): Payer: Medicare Other

## 2020-06-01 DIAGNOSIS — I480 Paroxysmal atrial fibrillation: Secondary | ICD-10-CM

## 2020-06-03 ENCOUNTER — Other Ambulatory Visit: Payer: Self-pay | Admitting: Psychiatry

## 2020-06-03 LAB — CUP PACEART REMOTE DEVICE CHECK
Date Time Interrogation Session: 20220109063601
Implantable Pulse Generator Implant Date: 20211103

## 2020-06-09 ENCOUNTER — Other Ambulatory Visit: Payer: Self-pay | Admitting: Internal Medicine

## 2020-06-11 NOTE — Telephone Encounter (Signed)
Prescription refill request for Eliquis received. Indication: a fib Last office visit: 03/28/20 Scr: 0.88 Age: 86 Weight: 69kg

## 2020-06-13 ENCOUNTER — Other Ambulatory Visit: Payer: Self-pay

## 2020-06-13 ENCOUNTER — Telehealth (INDEPENDENT_AMBULATORY_CARE_PROVIDER_SITE_OTHER): Payer: Medicare Other | Admitting: Psychiatry

## 2020-06-13 DIAGNOSIS — F332 Major depressive disorder, recurrent severe without psychotic features: Secondary | ICD-10-CM

## 2020-06-14 ENCOUNTER — Telehealth (HOSPITAL_COMMUNITY): Payer: Self-pay | Admitting: Psychiatry

## 2020-06-14 ENCOUNTER — Other Ambulatory Visit (HOSPITAL_COMMUNITY): Payer: Medicare Other | Attending: Psychiatry | Admitting: Psychiatry

## 2020-06-14 ENCOUNTER — Other Ambulatory Visit: Payer: Self-pay

## 2020-06-14 DIAGNOSIS — F332 Major depressive disorder, recurrent severe without psychotic features: Secondary | ICD-10-CM | POA: Insufficient documentation

## 2020-06-14 DIAGNOSIS — F411 Generalized anxiety disorder: Secondary | ICD-10-CM | POA: Insufficient documentation

## 2020-06-14 MED ORDER — ROSUVASTATIN CALCIUM 40 MG PO TABS: 40.0000 mg | ORAL_TABLET | Freq: Every day | ORAL | 6 refills | Status: DC

## 2020-06-14 NOTE — Progress Notes (Signed)
Psychiatric Initial Adult Assessment   Patient Identification: Derrick Compton MRN:  [id number] Date of Evaluation:  06/14/2020 Referral Source: self Chief Complaint:  I am struggling with depression, my current medications don't seem to help Visit Diagnosis:    ICD-10-CM   1. Severe episode of recurrent major depressive disorder, without psychotic features ([place])  F33.2     History of Present Illness:   Patient is a 86 year old male who presents today for an evaluation for [place].  Patient reports that he has been struggling with depression for couple of years now, and is that it initially started with having panic attacks, reports that he has anxiety now but not panic attacks.  Patient reports that he has been tried on various psychotropic medications, worries a lot about dying, and how that will happen.  On being questioned, patient states that he worries as he has a lot of medical problems, is afraid of death but has no plans of hurting himself or ending his life.  Patient reports that for the past 6 months, he has not been able to enjoy day-to-day activities, does not find pleasure in anything, reports he takes medications to sleep but that when he may wakes up in the morning, he is tired, has no motivation to do any activities and his sleep is not restful.  Patient also reports that he has problems with concentration at times he is not sure if it is because of his anxiety, adds that he feels hopeless and helpless with his current situation.  Patient states that he sees an outpatient psychiatrist but has not seen him for months but has continued his medications that were prescribed.  Patient reports that he wants to feel better, enjoy day-to-day activities, have energy, and not worry about everything.  I am being question if patient had tried therapy, intensive outpatient services, patient denied this.  Patient states that would be beneficial as it would help him with his coping mechanisms, help  him get his medications adjusted and if he does not do well, he would like to consider [place]  Associated Signs/Symptoms: Depression Symptoms:  depressed mood, fatigue, difficulty concentrating, hopelessness, loss of energy/fatigue, disturbed sleep, (Hypo) Manic Symptoms:  Distractibility, Anxiety Symptoms:  Excessive Worry, Psychotic Symptoms:  Hallucinations: None PTSD Symptoms: Negative  Past Psychiatric History: Patient follows up outpatient with Dr. Clovis Pu, reports that he is on Remeron and Zoloft.He also reports taking a low-dose of Xanax as needed.  Patient has been on Wellbutrin, citalopram, paroxetine, Deplin, BuSpar, Abilify, Rexulti, lithium in the past, patient reports that he has had side effects, has not been able to tolerate the above medications  Previous Psychotropic Medications: Yes   Substance Abuse History in the last 12 months:  No.  Consequences of Substance Abuse: Negative  Past Medical History:  Past Medical History:  Diagnosis Date  . Anxiety   . BPH (benign prostatic hyperplasia)   . CAD (coronary artery disease)   . Chest pain   . Hyperlipidemia   . Osteopenia   . Paroxysmal atrial fibrillation [facility])     Past Surgical History:  Procedure Laterality Date  . ATRIAL FIBRILLATION ABLATION N/A 11/10/2019   Procedure: ATRIAL FIBRILLATION ABLATION;  Surgeon: [name], MD;  Location: [place] [facility];  Service: Cardiovascular;  Laterality: N/A;  . implantable loop recorder placement  03/28/2020    Medtronic Reveal Linq model LNQ 22 (SN [id number] ) implantable loop recorder implanted for afib management  . LEFT HEART CATH AND CORONARY ANGIOGRAPHY  N/A 02/05/2017   Procedure: LEFT HEART CATH AND CORONARY ANGIOGRAPHY;  Surgeon: Lorretta Harp, MD;  Location: [place] [facility];  Service: Cardiovascular;  Laterality: N/A;    Family Psychiatric History: None reported  Family History: No family history on file.  Social History:   Social History    Socioeconomic History  . Marital status: Married    Spouse name: Not on file  . Number of children: Not on file  . Years of education: Not on file  . Highest education level: Not on file  Occupational History  . Not on file  Tobacco Use  . Smoking status: Never Smoker  . Smokeless tobacco: Never Used  Substance and Sexual Activity  . Alcohol use: Yes    Alcohol/week: 2.0 standard drinks    Types: 2 Standard drinks or equivalent per week  . Drug use: No  . Sexual activity: Not on file  Other Topics Concern  . Not on file  Social History Narrative   Lives in [place] with wife.      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   Social Determinants of Health   Financial Resource Strain: Not on file  Food Insecurity: Not on file  Transportation Needs: Not on file  Physical Activity: Not on file  Stress: Not on file  Social Connections: Not on file    Additional Social History: Patient reports that he lives with his wife, does not have any kids, is retired  Allergies:  No Known Allergies  Metabolic Disorder Labs: No results found for: HGBA1C, MPG No results found for: PROLACTIN Lab Results  Component Value Date   CHOL 101 03/30/2020   TRIG 74 03/30/2020   HDL 50 03/30/2020   CHOLHDL 2.0 03/30/2020   VLDL 20 02/04/2011   LDLCALC 36 03/30/2020   LDLCALC 123 (H) 01/04/2020   Lab Results  Component Value Date   TSH 2.200 06/15/2019    Therapeutic Level Labs: No results found for: LITHIUM No results found for: CBMZ No results found for: VALPROATE  Current Medications: Current Outpatient Medications  Medication Sig Dispense Refill  . diltiazem (CARDIZEM) 30 MG tablet TAKE 1 TABLET EVERY 4 HOURS AS NEEDED FOR AFIB HEART RATE >100 45 tablet 3  . ELIQUIS 5 MG TABS tablet TAKE 1 TABLET BY MOUTH TWICE A DAY 60 tablet 11  . Evolocumab (REPATHA SURECLICK) 017 MG/ML SOAJ Inject 1 pen into the skin every 14 (fourteen) days. 2 mL 11  . LORazepam  (ATIVAN) 0.5 MG tablet Take 1 tablet (0.5 mg total) by mouth every 8 (eight) hours. 30 tablet 1  . mirtazapine (REMERON) 30 MG tablet TAKE 1 TABLET BY MOUTH EVERYDAY AT BEDTIME 90 tablet 1  . Protein POWD Take 1 Scoop by mouth See admin instructions. Take monday- Friday vega essentials shake    . rosuvastatin (CRESTOR) 40 MG tablet Take 40 mg by mouth daily.   3  . sertraline (ZOLOFT) 100 MG tablet TAKE 1 TABLET BY MOUTH EVERY DAY 90 tablet 0   No current facility-administered medications for this visit.    Musculoskeletal: Strength & Muscle Tone: within normal limits Gait & Station: normal Patient leans: N/A  Psychiatric Specialty Exam: Review of Systems  There were no vitals taken for this visit.There is no height or weight on file to calculate BMI.  General Appearance: Casual  Eye Contact:  Good  Speech:  Clear and Coherent and Normal Rate  Volume:  Decreased  Mood:  Depressed, Dysphoric  and Hopeless  Affect:  Congruent and Depressed  Thought Process:  Coherent, Goal Directed and Descriptions of Associations: Intact  Orientation:  Full (Time, Place, and Person)  Thought Content:  Logical and Rumination  Suicidal Thoughts:  No  Homicidal Thoughts:  No  Memory:  Immediate;   Fair Recent;   Fair Remote;   Fair  Judgement:  Intact  Insight:  Present  Psychomotor Activity:  Normal  Concentration:  Concentration: Fair and Attention Span: Fair  Recall:  [employer] of Knowledge:Fair  Language: Fair  Akathisia:  No  Handed:  Right  AIMS (if indicated):  not done  Assets:  Desire for Improvement Financial Resources/Insurance Housing Intimacy Leisure Time Social Support Talents/Skills Transportation  ADL's:  Intact  Cognition: WNL  Sleep:  Poor   Screenings:   Assessment and Plan: Patient is an 86 year old male diagnosed with major depressive disorder and generalized anxiety disorder who presents today for a evaluation for [place].  Patient endorses symptoms of  depression, is unable to function in the mornings, does not have a restful sleep, is anxious a lot, worries about how he would die, reports he does not enjoy activities, has no pleasure in anything he does even though in the past he used to enjoy playing golf, still plays but does not feel any happiness.  Patient states that he has tried antidepressants, has never tried intensive outpatient services, would like to try that prior to trying [place]. Hampton Abbot, MD 1/20/20229:48 AM

## 2020-06-14 NOTE — Progress Notes (Signed)
Virtual Visit via Video Note  I connected with Derrick Compton on @TODAY @ at 12:00 PM EST by a video enabled telemedicine application and verified that I am speaking with the correct person using two identifiers.  Location: Patient: at home Provider: at home office   I discussed the limitations of evaluation and management by telemedicine and the availability of in person appointments. The patient expressed understanding and agreed to proceed. I discussed the assessment and treatment plan with the patient. The patient was provided an opportunity to ask questions and all were answered. The patient agreed with the plan and demonstrated an understanding of the instructions.   The patient was advised to call back or seek an in-person evaluation if the symptoms worsen or if the condition fails to improve as anticipated.  I provided 60 minutes of non-face-to-face time during this encounter.   Dellia Nims, M.Ed, CNA   Comprehensive Clinical Assessment (CCA) Note  06/14/2020 Derrick Compton [id number]  Chief Complaint:  Chief Complaint  Patient presents with  . Depression  . Anxiety   Visit Diagnosis: F 33.2   CCA Screening, Triage and Referral (STR)  Patient Reported Information How did you hear about [place]? Other (Comment)  Referral name: Dr. Hampton Abbot  Referral phone number: No data recorded  Whom do you see for routine medical problems? I don't have a doctor  Practice/Facility Name: No data recorded Practice/Facility Phone Number: No data recorded Name of Contact: No data recorded Contact Number: No data recorded Contact Fax Number: No data recorded Prescriber Name: No data recorded Prescriber Address (if known): No data recorded  What Is the Reason for Your Visit/Call Today? Worsening depressive and anxiety sx's  How Long Has This Been Causing You Problems? 1-6 months  What Do You Feel Would Help You the Most Today? Group Therapy; Medication   Have You Recently  Been in Any Inpatient Treatment (Hospital/Detox/Crisis Center/28-Day Program)? No  Name/Location of Program/Hospital:No data recorded How Long Were You There? No data recorded When Were You Discharged? No data recorded  Have You Ever Received Services From [facility] Before? No  Who Do You See at [facility]? No data recorded  Have You Recently Had Any Thoughts About Hurting Yourself? No  Are You Planning to Commit Suicide/Harm Yourself At This time? No   Have you Recently Had Thoughts About [place]? No  Explanation: No data recorded  Have You Used Any Alcohol or Drugs in the Past 24 Hours? No data recorded How Long Ago Did You Use Drugs or Alcohol? No data recorded What Did You Use and How Much? No data recorded  Do You Currently Have a Therapist/Psychiatrist? Yes  Name of Therapist/Psychiatrist: Dr. Clovis Pu   Have You Been Recently Discharged From Any Office Practice or Programs? No  Explanation of Discharge From Practice/Program: No data recorded    CCA Screening Triage Referral Assessment Type of Contact: Face-to-Face  Is this Initial or Reassessment? No data recorded Date Telepsych consult ordered in [facility]:  No data recorded Time Telepsych consult ordered in [facility]:  No data recorded  Patient Reported Information Reviewed? Yes  Patient Left Without Being Seen? No data recorded Reason for Not Completing Assessment: No data recorded  Collateral Involvement: No data recorded  Does Patient Have a [place]? No data recorded Name and Contact of Legal Guardian: No data recorded If Minor and Not Living with Parent(s), Who has Custody? No data recorded Is CPS involved or ever been involved? Never  Is APS involved or ever been involved? Never   Patient Determined To Be At Risk for Harm To Self or Others Based on Review of Patient Reported Information or Presenting Complaint? No  Method: No data recorded Availability of Means: No data  recorded Intent: No data recorded Notification Required: No data recorded Additional Information for Danger to Others Potential: No data recorded Additional Comments for Danger to Others Potential: No data recorded Are There Guns or Other Weapons in Your Home? No data recorded Types of Guns/Weapons: No data recorded Are These Weapons Safely Secured?                            No data recorded Who Could Verify You Are Able To Have These Secured: No data recorded Do You Have any Outstanding Charges, Pending Court Dates, Parole/Probation? No data recorded Contacted To Inform of Risk of Harm To Self or Others: No data recorded  Location of Assessment: No data recorded  Does Patient Present under Involuntary Commitment? No  IVC Papers Initial File Date: No data recorded  [place] of Residence: [place]   Patient Currently Receiving the Following Services: IOP (Intensive Outpatient Program)   Determination of Need: Routine (7 days)   Options For Referral: Intensive Outpatient Therapy     CCA Biopsychosocial Intake/Chief Complaint:  This is a 86 yr old, married, Caucasian male, who was referred per Dr. Hampton Abbot; after pt didn't meet the criteria for [place].  According to pt, he as been depressed and anxious (panic attacks) for the past three months, but the sx's has been worsening.  "I think my medications need to be changed. I just feel scared all the time and I don't enjoy things that I do anymore."  Pt reports he exercises at least six times a week and has to push himself to do it.  States wife of 21 yrs marriage is very supportive.  No apparent stressors.  Current Symptoms/Problems: Sadness, anxious (panic attacks), fatigue, disturbed sleep (nightmares), feelings of hopelessness and helplessness, anhedonia, poor concentration, ruminating thoughts, decreased motivation, denies SI/HI or A/V hallucinations.   Patient Reported Schizophrenia/Schizoaffective Diagnosis in Past:  No   Strengths: "I am easy to get along with."  Preferences: Medication Mgmt; states he wants to work on being patient.  Abilities: Pt is motivated to try group.   Type of Services Patient Feels are Needed: MH-IOP   Initial Clinical Notes/Concerns: No data recorded  Mental Health Symptoms Depression:  Change in energy/activity; Difficulty Concentrating; Fatigue; Hopelessness (disturbed sleep (nightmares))   Duration of Depressive symptoms: Greater than two weeks   Mania:  N/A   Anxiety:   Worrying; Difficulty concentrating   Psychosis:  None   Duration of Psychotic symptoms: No data recorded  Trauma:  N/A   Obsessions:  N/A   Compulsions:  N/A   Inattention:  N/A   Hyperactivity/Impulsivity:  N/A   Oppositional/Defiant Behaviors:  N/A   Emotional Irregularity:  N/A   Other Mood/Personality Symptoms:  No data recorded   Mental Status Exam Appearance and self-care  Stature:  Average   Weight:  Average weight   Clothing:  Casual   Grooming:  Normal   Cosmetic use:  None   Posture/gait:  Normal   Motor activity:  Not Remarkable   Sensorium  Attention:  Normal   Concentration:  Normal   Orientation:  X5   Recall/memory:  Normal   Affect and Mood  Affect:  Depressed  Mood:  Anxious   Relating  Eye contact:  Normal   Facial expression:  Responsive   Attitude toward examiner:  Cooperative   Thought and Language  Speech flow: Normal   Thought content:  Appropriate to Mood and Circumstances   Preoccupation:  None   Hallucinations:  None   Organization:  No data recorded  [employer] of Knowledge:  Fair   Intelligence:  Average   Abstraction:  Normal   Judgement:  Good   Reality Testing:  Adequate   Insight:  Good   Decision Making:  Only simple   Social Functioning  Social Maturity:  Responsible   Social Judgement:  Normal   Stress  Stressors:  -- (denies any stressors)   Coping Ability:  Resilient    Skill Deficits:  Activities of daily living   Supports:  Family; Friends/Service system     Religion: Religion/Spirituality Are You A Religious Person?: Yes (states he hasn't been to church in a couple of yrs; but he reads) What is Your Religious Affiliation?: Protestant  Leisure/Recreation: Leisure / Recreation Do You Have Hobbies?: Yes Leisure and Hobbies: playing golf and racing sports cars  Exercise/Diet: Exercise/Diet Do You Exercise?: Yes What Type of Exercise Do You Do?: Weight Training,Run/Walk,Stair Climbing How Many Times a Week Do You Exercise?: 6-7 times a week Have You Gained or Lost A Significant Amount of Weight in the Past Six Months?: No Do You Follow a Special Diet?: No Do You Have Any Trouble Sleeping?: Yes Explanation of Sleeping Difficulties: c/o having nightmares   CCA Employment/Education Employment/Work Situation: Employment / Work Situation Employment situation: Employed 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 How long has patient been employed?: 43 plus yrs Patient's job has been impacted by current illness: Yes Describe how patient's job has been impacted: not motivated to do anything Has patient ever been in the [employer]?: Yes (Describe in comment) (was in the [employer] yrs ago; honorable d/c)  Education: Education Is Patient Currently Attending School?: No Did Teacher, adult education From [employer]?: Yes Did Physicist, medical?: Yes What Type of College Degree Do you Have?: Accounting Did You Attend Graduate School?: No What Was Your Major?: Accounting Did You Have An Individualized Education Program (IIEP): No Did You Have Any Difficulty At School?: No (; states he was a little on the hyper side (never medicated).) Patient's Education Has Been Impacted by Current Illness: No   CCA Family/Childhood History Family and Relationship History: Family history Marital status: Married Number of Years  Married: 51 What types of issues is patient dealing with in the relationship?: N/A Are you sexually active?: No What is your sexual orientation?: Heterosexual Does patient have children?: No  Childhood History:  Childhood History By whom was/is the patient raised?: Mother/father and step-parent Additional childhood history information: Born in [place], [place].  Mother died when pt was age 89 d/t meningitis.  Pt was an only child.  Father's job moved pt and father to [place] at age 66.  Father remarried.  When father retired they moved to a farm.  Pt had some stepsiblings.  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  Denies any trauma or abuse.  Reports being an average student in school; a little on the hyper side according to pt. Does patient have siblings?: No (step siblings) Did patient suffer any verbal/emotional/physical/sexual abuse as a child?: No Did patient suffer from severe childhood neglect?: No Has patient ever been sexually abused/assaulted/raped as an adolescent  or adult?: No Was the patient ever a victim of a crime or a disaster?: No Witnessed domestic violence?: No Has patient been affected by domestic violence as an adult?: No  Child/Adolescent Assessment:     CCA Substance Use Alcohol/Drug Use: Alcohol / Drug Use Pain Medications: cc: MAR Prescriptions: cc: MAR Over the Counter: cc: MAR History of alcohol / drug use?: No history of alcohol / drug abuse                         ASAM's:  Six Dimensions of Multidimensional Assessment  Dimension 1:  Acute Intoxication and/or Withdrawal Potential:      Dimension 2:  Biomedical Conditions and Complications:      Dimension 3:  Emotional, Behavioral, or Cognitive Conditions and Complications:     Dimension 4:  Readiness to Change:     Dimension 5:  Relapse, Continued use, or Continued Problem Potential:     Dimension 6:  Recovery/Living Environment:     ASAM Severity Score:    ASAM Recommended Level of Treatment:      Substance use Disorder (SUD)    Recommendations for Services/Supports/Treatments: Recommendations for Services/Supports/Treatments Recommendations For Services/Supports/Treatments: IOP (Intensive Outpatient Program)  DSM5 Diagnoses: Patient Active Problem List   Diagnosis Date Noted  . Shortness of breath 04/13/2020  . Lung nodules 04/05/2020  . Bladder calculi 01/02/2020  . Paroxysmal atrial fibrillation ([place]) 08/17/2019  . Noise-induced hearing loss 02/08/2019  . Pneumonia of right lung due to infectious organism 08/17/2018  . Unspecified osteoarthritis, unspecified site 06/13/2018  . Bradycardia, sinus 11/13/2017  . Coronary artery disease 09/22/2017  . Aortic stenosis, mild 01/16/2017  . Chest pain 10/28/2016  . Colon cancer ([place]) 10/22/2016  . Hyperlipidemia 10/22/2016  . Sensorineural hearing loss (SNHL), bilateral 10/22/2016  . Anxiety 04/23/2016  . BPH (benign prostatic hyperplasia) 04/23/2016  . Hearing difficulty of right ear 04/23/2016  . Uses hearing aid 04/23/2016  . Benign non-nodular prostatic hyperplasia with lower urinary tract symptoms 11/28/2014  . Kidney stone 11/28/2014  . H/O hematuria 11/28/2014  . Recurrent nephrolithiasis 11/28/2014  . Status post corneal transplant 12/08/2011  . Fuchs' corneal dystrophy 05/07/2011  . Status post LASIK surgery 05/07/2011  . S/P ablation of atrial fibrillation 05/07/2011  . UNEQUAL LEG LENGTH 01/10/2010  . MUSCLE STRAIN, LEFT CALF 01/10/2010    Patient Centered Plan: Patient is on the following Treatment Plan(s):  Anxiety and Depression Oriented pt to virtual MH-IOP.  Pt gave verbal consent for treatment, to release chart information to referred providers and to complete any forms if needed.  Pt also gave consent for attending group virtually d/t COVID-19 social distancing restrictions.  Encouraged support groups.  Will refer pt to a therapist.  F/U with Dr. Lynder Parents.  Referrals to Alternative  Service(s): Referred to Alternative Service(s):   Place:   Date:   Time:    Referred to Alternative Service(s):   Place:   Date:   Time:    Referred to Alternative Service(s):   Place:   Date:   Time:    Referred to Alternative Service(s):   Place:   Date:   Time:     Dellia Nims, M.Ed,CNA

## 2020-06-14 NOTE — Telephone Encounter (Signed)
D:  Dr. Dwyane Dee referred pt to [facility].  A:  Oriented pt and answered his questions.  He requested to start [place] on 06-18-20.  Inform Beather Arbour, RN and Ricky Ala, NP.  R:  Pt receptive.

## 2020-06-15 NOTE — Progress Notes (Signed)
Carelink Summary Report / Loop Recorder 

## 2020-06-18 ENCOUNTER — Other Ambulatory Visit (HOSPITAL_COMMUNITY): Payer: Medicare Other | Admitting: Psychiatry

## 2020-06-18 ENCOUNTER — Other Ambulatory Visit: Payer: Self-pay

## 2020-06-18 ENCOUNTER — Encounter (HOSPITAL_COMMUNITY): Payer: Self-pay | Admitting: Psychiatry

## 2020-06-18 DIAGNOSIS — F411 Generalized anxiety disorder: Secondary | ICD-10-CM

## 2020-06-18 DIAGNOSIS — F332 Major depressive disorder, recurrent severe without psychotic features: Secondary | ICD-10-CM | POA: Diagnosis not present

## 2020-06-18 NOTE — Progress Notes (Signed)
Virtual Visit via Video Note  I connected with Derrick Compton on 06/18/20 at  9:00 AM EST by a video enabled telemedicine application and verified that I am speaking with the correct person using two identifiers.  At orientation to the IOP program, Case Manager discussed the limitations of evaluation and management by telemedicine and the availability of in person appointments. The patient expressed understanding and agreed to proceed with virtual visits throughout the duration of the program.   Location:  Patient: Patient Home Provider: Home Office   History of Present Illness: MDD and GAD  Observations/Objective: Check In: Case Manager checked in with all participants to review discharge dates, insurance authorizations, work-related documents and needs from the treatment team. Client stated needs and engaged in discussion. Case Manager introduced 2 new group members and prompted group to engage in the joining process.     Initial Therapeutic Activity: Counselor facilitated a group processing with group members to assess mood and current functioning. Counselor prompted group members to share about new life stressors, utilization of coping skills, and progress in treatment. Today is the Client's first group treatment session, with Counselor prompting to share what brought him to treatment, about his support system, life stressors and expectations for treatment. Client shared that he is experiencing depression and anxiety with no know triggers, onset 1 year ago. Client is working on finding right medications to stabilize to his baseline. Client would like to explore reasons for depression and anxiety symptoms in group setting. Client presents with moderate depression and moderate anxiety. Client denied any current SI/HI/psychosis.   Second Therapeutic Activity: Counselor prompted group members to share about what coping skills have been helpful over their course of treatment, benefits of group  treatment vs individual therapy, and topics they are interested in learning more about in the coming days. Group members engaged in discussion, sharing thoughts, experiences and ideas. Client disengaged in conversation, reaching out to case manager and nurse practitioner to request to be removed from the group. Client anxious about being in a group setting. Case Manager to follow up with discharge needs.   Assessment and Plan: Clinician recommends that Client remain in IOP treatment to better manage mental health symptoms, stabilization and to address treatment plan goals, however if uncomfortable in continuing, for Client to be stepped down to medication management and individual therapy, or seek support group. Clinician recommends adherence to crisis/safety plan, taking medications as prescribed, and following up with medical professionals if any issues arise.   Follow Up Instructions: Clinician will send Webex link for next session. The Client was advised to call back or seek an in-person evaluation if the symptoms worsen or if the condition fails to improve as anticipated.     I provided 180 minutes of non-face-to-face time during this encounter.     Lise Auer, LCSW

## 2020-06-19 ENCOUNTER — Other Ambulatory Visit: Payer: Self-pay

## 2020-06-19 ENCOUNTER — Other Ambulatory Visit (HOSPITAL_COMMUNITY): Payer: Medicare Other

## 2020-06-19 NOTE — Progress Notes (Signed)
Virtual Visit via Video Note  I connected with Derrick Compton on 06/19/20 at  9:00 AM EST by a video enabled telemedicine application and verified that I am speaking with the correct person using two identifiers.  Location: Patient: home Provider: office   I discussed the limitations of evaluation and management by telemedicine and the availability of in person appointments. The patient expressed understanding and agreed to proceed.   I discussed the assessment and treatment plan with the patient. The patient was provided an opportunity to ask questions and all were answered. The patient agreed with the plan and demonstrated an understanding of the instructions.   The patient was advised to call back or seek an in-person evaluation if the symptoms worsen or if the condition fails to improve as anticipated.  I provided 15 minutes of non-face-to-face time during this encounter.   Derrill Center, NP    Psychiatric Initial Adult Assessment   Patient Identification: Derrick Compton MRN:  [id number] Date of Evaluation:  06/19/2020 Referral Source: MD Derrick Compton Chief Complaint:   Chief Complaint    Depression     Visit Diagnosis:    ICD-10-CM   1. Severe episode of recurrent major depressive disorder, without psychotic features ([place])  F33.2   2. Generalized anxiety disorder  F41.1     History of Present Illness:  Derrick Compton 86 year old Caucasian male presents due to chronic depression reported symptoms. "  I do not know why I am depressed I just feel sad all the time."  Patient attributes his depression to aging.  He is denying suicidal or homicidal ideations.  Denies auditory or visual hallucinations.  Does report passive suicidal ideations, however denies plan or intent.  Reports he is currently followed by Dr. Clovis Compton for medication management, denied any symptom relief with medications.  Patient endorses decreased energy, worthlessness, fatigue and decreased concentration and symptoms  of worry.  Reports he was advised to follow-up with [facility] however he opted to try Intensive outpatient programming on 06/18/2020.  During this assessment patient declined to continue in IOP.  Per admission assessment note by MD on 06/13/2020 Patient is a 86 year old male who presents today for an evaluation for Derrick Compton.  Patient reports that he has been struggling with depression for couple of years now, and is that it initially started with having panic attacks, reports that he has anxiety now but not panic attacks.Patient reports that he has been tried on various psychotropic medications, worries a lot about dying, and how that will happen.  On being questioned, patient states that he worries as he has a lot of medical problems, is afraid of death but has no plans of hurting himself or ending his life.  Patient reports that for the past 6 months, he has not been able to enjoy day-to-day activities, does not find pleasure in anything, reports he takes medications to sleep but that when he may wakes up in the morning, he is tired, has no motivation to do any activities and his sleep is not restful.  Patient also reports that he has problems with concentration at times he is not sure if it is because of his anxiety, adds that he feels hopeless and helpless with his current situation.  Patient states that he sees an outpatient psychiatrist but has not seen him for months but has continued his medications that were prescribed.  Patient reports that he wants to feel better, enjoy day-to-day activities, have energy, and not worry about everything.  Associated Signs/Symptoms: Depression Symptoms:  depressed mood, anhedonia, feelings of worthlessness/guilt, difficulty concentrating, hopelessness, (Hypo) Manic Symptoms:  Distractibility, Irritable Mood, Anxiety Symptoms:  Excessive Worry, Social Anxiety, Psychotic Symptoms:  Hallucinations: None PTSD Symptoms: NA  Past Psychiatric History: Charted  history on 1/19/2022Remeron and Zoloft.He also reports taking a low-dose of Xanax as needed.  Patient has been on Wellbutrin, citalopram, paroxetine, Deplin, BuSpar, Abilify, Rexulti, lithium in the past, patient reports that he has had side effects, has not been able to tolerate the above medications   Previous Psychotropic Medications: Yes   Substance Abuse History in the last 12 months:  No.  Consequences of Substance Abuse: NA  Past Medical History:  Past Medical History:  Diagnosis Date  . Anxiety   . BPH (benign prostatic hyperplasia)   . CAD (coronary artery disease)   . Chest pain   . Hyperlipidemia   . Osteopenia   . Paroxysmal atrial fibrillation [facility])     Past Surgical History:  Procedure Laterality Date  . ATRIAL FIBRILLATION ABLATION N/A 11/10/2019   Procedure: ATRIAL FIBRILLATION ABLATION;  Surgeon: [name], MD;  Location: [facility];  Service: Cardiovascular;  Laterality: N/A;  . implantable loop recorder placement  03/28/2020    Medtronic Reveal Linq model LNQ 22 (SN [id number] ) implantable loop recorder implanted for afib management  . LEFT HEART CATH AND CORONARY ANGIOGRAPHY N/A 02/05/2017   Procedure: LEFT HEART CATH AND CORONARY ANGIOGRAPHY;  Surgeon: Lorretta Harp, MD;  Location: [facility];  Service: Cardiovascular;  Laterality: N/A;    Family Psychiatric History:   Family History: History reviewed. No pertinent family history.  Social History:   Social History   Socioeconomic History  . Marital status: Married    Spouse name: Not on file  . Number of children: Not on file  . Years of education: Not on file  . Highest education level: Not on file  Occupational History  . Not on file  Tobacco Use  . Smoking status: Never Smoker  . Smokeless tobacco: Never Used  Substance and Sexual Activity  . Alcohol use: Yes    Alcohol/week: 2.0 standard drinks    Types: 2 Standard drinks or equivalent per week  . Drug use: No  .  Sexual activity: Not on file  Other Topics Concern  . Not on file  Social History Narrative   Lives in [place] with wife.      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   Social Determinants of Health   Financial Resource Strain: Not on file  Food Insecurity: Not on file  Transportation Needs: Not on file  Physical Activity: Not on file  Stress: Not on file  Social Connections: Not on file    Additional Social History:   Allergies:  No Known Allergies  Metabolic Disorder Labs: No results found for: HGBA1C, MPG No results found for: PROLACTIN Lab Results  Component Value Date   CHOL 101 03/30/2020   TRIG 74 03/30/2020   HDL 50 03/30/2020   CHOLHDL 2.0 03/30/2020   VLDL 20 02/04/2011   LDLCALC 36 03/30/2020   LDLCALC 123 (H) 01/04/2020   Lab Results  Component Value Date   TSH 2.200 06/15/2019    Therapeutic Level Labs: No results found for: LITHIUM No results found for: CBMZ No results found for: VALPROATE  Current Medications: Current Outpatient Medications  Medication Sig Dispense Refill  . diltiazem (CARDIZEM) 30 MG tablet TAKE 1 TABLET EVERY 4  HOURS AS NEEDED FOR AFIB HEART RATE >100 45 tablet 3  . ELIQUIS 5 MG TABS tablet TAKE 1 TABLET BY MOUTH TWICE A DAY 60 tablet 11  . Evolocumab (REPATHA SURECLICK) [id number] MG/ML SOAJ Inject 1 pen into the skin every 14 (fourteen) days. 2 mL 11  . LORazepam (ATIVAN) 0.5 MG tablet Take 1 tablet (0.5 mg total) by mouth every 8 (eight) hours. 30 tablet 1  . mirtazapine (REMERON) 30 MG tablet TAKE 1 TABLET BY MOUTH EVERYDAY AT BEDTIME 90 tablet 1  . Protein POWD Take 1 Scoop by mouth See admin instructions. Take monday- Friday vega essentials shake    . rosuvastatin (CRESTOR) 40 MG tablet Take 1 tablet (40 mg total) by mouth daily. 30 tablet 6  . sertraline (ZOLOFT) 100 MG tablet TAKE 1 TABLET BY MOUTH EVERY DAY 90 tablet 0   No current facility-administered medications for this visit.     Musculoskeletal: Strength & Muscle Tone: within normal limits Gait & Station: normal Patient leans: N/A  Psychiatric Specialty Exam: Review of Systems  There were no vitals taken for this visit.There is no height or weight on file to calculate BMI.  General Appearance: Fairly Groomed  Eye Contact:  Good  Speech:  Clear and Coherent  Volume:  Normal  Mood:  Anxious and Depressed  Affect:  Congruent  Thought Process:  Coherent  Orientation:  Full (Time, Place, and Person)  Thought Content:  Logical  Suicidal Thoughts:  No  Homicidal Thoughts:  No  Memory:  Immediate;   Fair Recent;   Fair  Judgement:  Fair  Insight:  Good  Psychomotor Activity:  Normal  Concentration:  Concentration: Fair  Recall:  [employer] of Knowledge:Fair  Language: Good  Akathisia:  No  Handed:  Right  AIMS (if indicated):   Assets:  Communication Skills Desire for Improvement Social Support Talents/Skills  ADL's:  Intact  Cognition: WNL  Sleep:  Fair   Screenings: PHQ2-9   [place] from 06/13/2020 in [facility]  PHQ-2 Total Score 6  PHQ-9 Total Score 20      Assessment and Plan:  Juanda Crumble declined starting Intensive Outpatient Programming- IOP Keep follow-up appointment with Dr. Clovis Pu NP will follow up with MD Derrick Compton regarding [place] consideration   Derrill Center, NP 1/25/202210:57 AM

## 2020-06-20 ENCOUNTER — Ambulatory Visit (HOSPITAL_COMMUNITY): Payer: Medicare Other

## 2020-06-21 ENCOUNTER — Ambulatory Visit (HOSPITAL_COMMUNITY): Payer: Medicare Other

## 2020-06-22 ENCOUNTER — Ambulatory Visit (HOSPITAL_COMMUNITY): Payer: Medicare Other

## 2020-06-25 ENCOUNTER — Other Ambulatory Visit: Payer: Self-pay

## 2020-06-25 ENCOUNTER — Encounter: Payer: Self-pay | Admitting: Adult Health

## 2020-06-25 ENCOUNTER — Ambulatory Visit (INDEPENDENT_AMBULATORY_CARE_PROVIDER_SITE_OTHER): Payer: Medicare Other | Admitting: Adult Health

## 2020-06-25 ENCOUNTER — Ambulatory Visit (HOSPITAL_COMMUNITY): Payer: Medicare Other

## 2020-06-25 DIAGNOSIS — J189 Pneumonia, unspecified organism: Secondary | ICD-10-CM | POA: Diagnosis not present

## 2020-06-25 DIAGNOSIS — R918 Other nonspecific abnormal finding of lung field: Secondary | ICD-10-CM | POA: Diagnosis not present

## 2020-06-25 NOTE — Assessment & Plan Note (Signed)
Lung nodules noted on CT scan.  Most recent CT November 2021 from [facility] clinic per care everywhere showed a increased right upper lobe nodule measuring 2.1 cm with air bronchograms.  Subsequent PET scan showed a decrease in right upper lobe lung nodule down to 1.3 cm.  Most consistent with a infectious/inflammatory source. Patient has been recommended to have a follow-up CT chest in 3 to 6 months.  We will repeat this at a 66-month mark.  Patient has a follow-up visit with Dr. Chase Caller in March.  We will do CT prior to that visit without contrast. Chart review does show that patient has had some recurrent areas of groundglass opacities in the right lung.  Discussion with patient regarding possible reflux versus aspiration.  Patient has no known swallow evaluations if continues to have areas on the right might need to consider a modified barium swallow for further evaluation.  Plan  Patient Instructions  Set up CT chest without contrast in late Feb 2022 .  Activity as tolerated.  Follow up with Dr. Chase Caller as planned in March

## 2020-06-25 NOTE — Patient Instructions (Addendum)
Set up CT chest without contrast in late Feb 2022 .  Activity as tolerated.  Follow up with Dr. Chase Caller as planned in March

## 2020-06-25 NOTE — Progress Notes (Signed)
@Patient  ID: Derrick Compton, male    DOB: 10/23/1934, 86 y.o.   MRN: [id number]  Chief Complaint  Patient presents with  . Follow-up    Referring provider: Alonna Buckler, MD  HPI: 86 year old male never smoker seen for pulmonary consult March 14, 2019 for abnormal CT chest  Medical history significant for colon cancer in 1994 status post resection, A. fib status post ablation June 2021 on chronic anticoagulation with Xarelto  TEST/EVENTS :  Small infiltrate seen CT chest April 25, 2019 from [place] clinic in [place]  CT chest May 13, 2019 interval resolution of patchy groundglass opacity and airspace opacity in the superior right lower lobe consistent with a resolving inflammatory/infectious etiology.  Stable groundglass opacity in the right upper lobe with associated mild bronchiectasis suggesting a chronic postinflammatory scarring.  CT coronary scan September 12, 2019 multiple small pulmonary nodules measuring 2 to 3 mm scattered throughout the lungs bilaterally nonspecific  CT chest 04/03/20 There is an area of nodular consolidation within the right  upper lobe, with air bronchograms along the lower margin of the  nodularity, measuring approximately 2.1 cm in size (see series 3 image  53). This is in the region of the previously described groundglass  nodule on the prior examination. A few other small scattered tiny  pulmonary nodules, for example a 3 mm nodule in the right lower lobe  (series 3 image 146) do not appear significantly changed. (from care everywhere )   PET 04/11/2020 A 1.3 cm mildly FDG avid (SUV max = 2.6) nodule in the right upper  lobe has decreased in size since the previous exam when it measured 2.1  cm. * Subcentimeter focus of mildly increased FDG uptake (SUV max = 3.3) in  the left hilum. * Coronary artery calcifications. 1. The right upper lobe lung nodule has decreased in size since the  previous exam and shows mild FDG uptake. Infection  is favored over  neoplasm. Follow-up CT scan of the chest is recommended in 1-3 months to  show resolution. 2. Small focus of mildly increased FDG uptake in the left hilum, likely  a reactive lymph node. 3. Please see body of the report for more findings.    06/25/2020 Follow up ; Lung nodule  Patient returns for a follow-up visit.  Last seen in the office October 2020.  Patient is being followed for lung nodules on CT as above.  Recently a follow-up CT chest at [facility] in November showed a new right upper lobe lung nodule.  Notes reviewed from [facility] clinic in [place].  Patient had CT chest April 03, 2020 that showed a 2.1 cm irregular nodule with groundglass opacities, with air bronchograms. He did not have .  Patient was set up for a PET scan.  This showed a decrease in size of right upper lobe lung nodule from 2.1 cm to 1.3 cm.  There was mildly FDG avid (SUV max 2.6). Patient was treated with a 10-day course of doxycycline and recommended to repeat a CT chest in 3 to 6 months. We discussed repeating CT in March 2022. (4 month interval) .  No hemoptysis , weight loss, dysphagia , GERD or chronic sinus issues.  Denies cough . Occasional tickle in throat .   Follows with [facility] yearly for full physical . Lives locally but has property in [place].   Covid Vaccine x 3  Exercise 5-6 x weeks. Very active and independent.  No Known Allergies  Immunization History  Administered  Date(s) Administered  . Fluad Quad(high Dose 65+) 03/06/2019  . Influenza Split 05/26/2013  . Influenza, High Dose Seasonal PF 04/25/2016, 04/25/2016, 05/06/2017, 05/04/2018, 05/04/2018, 03/06/2019  . Influenza, Seasonal, Injecte, Preservative Fre 04/19/2012, 05/26/2012  . Influenza,inj,quad, With Preservative 04/19/2012, 05/26/2012, 05/26/2013, 02/24/2015  . Influenza-Unspecified 04/19/2012, 05/26/2012, 05/26/2013, 02/24/2015  . Moderna Sars-Covid-2 Vaccination 09/24/2019, 10/25/2019  .  Pneumococcal Conjugate-13 10/05/2014, 10/05/2014  . Pneumococcal Polysaccharide-23 05/26/2000, 05/26/2000, 05/27/2007, 05/27/2007  . Td 05/27/2003, 05/27/2003  . Tdap 05/27/2003, 04/19/2012, 04/19/2012  . Zoster 05/26/2005, 05/26/2005  . Zoster Recombinat (Shingrix) 04/23/2019    Past Medical History:  Diagnosis Date  . Anxiety   . BPH (benign prostatic hyperplasia)   . CAD (coronary artery disease)   . Chest pain   . Hyperlipidemia   . Osteopenia   . Paroxysmal atrial fibrillation ([facility])     Tobacco History: Social History   Tobacco Use  Smoking Status Never Smoker  Smokeless Tobacco Never Used   Counseling given: Not Answered   Outpatient Medications Prior to Visit  Medication Sig Dispense Refill  . diltiazem (CARDIZEM) 30 MG tablet TAKE 1 TABLET EVERY 4 HOURS AS NEEDED FOR AFIB HEART RATE >100 45 tablet 3  . ELIQUIS 5 MG TABS tablet TAKE 1 TABLET BY MOUTH TWICE A DAY 60 tablet 11  . Evolocumab (REPATHA SURECLICK) 716 MG/ML SOAJ Inject 1 pen into the skin every 14 (fourteen) days. 2 mL 11  . LORazepam (ATIVAN) 0.5 MG tablet Take 1 tablet (0.5 mg total) by mouth every 8 (eight) hours. 30 tablet 1  . mirtazapine (REMERON) 30 MG tablet TAKE 1 TABLET BY MOUTH EVERYDAY AT BEDTIME 90 tablet 1  . Protein POWD Take 1 Scoop by mouth See admin instructions. Take monday- Friday vega essentials shake    . rosuvastatin (CRESTOR) 40 MG tablet Take 1 tablet (40 mg total) by mouth daily. 30 tablet 6  . sertraline (ZOLOFT) 100 MG tablet TAKE 1 TABLET BY MOUTH EVERY DAY 90 tablet 0   No facility-administered medications prior to visit.     Review of Systems:   Constitutional:   No  weight loss, night sweats,  Fevers, chills, fatigue, or  lassitude.  HEENT:   No headaches,  Difficulty swallowing,  Tooth/dental problems, or  Sore throat,                No sneezing, itching, ear ache, nasal congestion, post nasal drip,   CV:  No chest pain,  Orthopnea, PND, swelling in lower  extremities, anasarca, dizziness, palpitations, syncope.   GI  No heartburn, indigestion, abdominal pain, nausea, vomiting, diarrhea, change in bowel habits, loss of appetite, bloody stools.   Resp: No shortness of breath with exertion or at rest.  No excess mucus, no productive cough,  No non-productive cough,  No coughing up of blood.  No change in color of mucus.  No wheezing.  No chest wall deformity  Skin: no rash or lesions.  GU: no dysuria, change in color of urine, no urgency or frequency.  No flank pain, no hematuria   MS:  No joint pain or swelling.  No decreased range of motion.  No back pain.    Physical Exam  BP 114/72   Pulse (!) 49   Temp 98.3 F (36.8 C) (Temporal)   Ht 5\' 10"  (1.778 m)   Wt 153 lb (69.4 kg)   SpO2 97% Comment: on RA  BMI 21.95 kg/m   GEN: A/Ox3; pleasant , NAD, well nourished  HEENT:  /AT,   NOSE-clear, THROAT-clear, no lesions, no postnasal drip or exudate noted.   NECK:  Supple w/ fair ROM; no JVD; normal carotid impulses w/o bruits; no thyromegaly or nodules palpated; no lymphadenopathy.    RESP  Clear  P & A; w/o, wheezes/ rales/ or rhonchi. no accessory muscle use, no dullness to percussion  CARD:  RRR, no m/r/g, no peripheral edema, pulses intact, no cyanosis or clubbing.  GI:   Soft & nt; nml bowel sounds; no organomegaly or masses detected.   Musco: Warm bil, no deformities or joint swelling noted.   Neuro: alert, no focal deficits noted.    Skin: Warm, no lesions or rashes    Lab Results:    BNP No results found for: BNP  ProBNP No results found for: PROBNP  Imaging:     No flowsheet data found.  No results found for: NITRICOXIDE      Assessment & Plan:   Lung nodules Lung nodules noted on CT scan.  Most recent CT November 2021 from [facility] clinic per care everywhere showed a increased right upper lobe nodule measuring 2.1 cm with air bronchograms.  Subsequent PET scan showed a decrease in right  upper lobe lung nodule down to 1.3 cm.  Most consistent with a infectious/inflammatory source. Patient has been recommended to have a follow-up CT chest in 3 to 6 months.  We will repeat this at a 13-month mark.  Patient has a follow-up visit with Dr. Chase Caller in March.  We will do CT prior to that visit without contrast. Chart review does show that patient has had some recurrent areas of groundglass opacities in the right lung.  Discussion with patient regarding possible reflux versus aspiration.  Patient has no known swallow evaluations if continues to have areas on the right might need to consider a modified barium swallow for further evaluation.  Plan  Patient Instructions  Set up CT chest without contrast in late Feb 2022 .  Activity as tolerated.  Follow up with Dr. Chase Caller as planned in March        Pneumonia of right lung due to infectious organism Patient with a 2.1 cm right upper lobe consolidation/nodule with associated air bronchograms November 2021.  Patient was treated with a 10-day course of doxycycline.  Clinically had no infectious symptoms such as cough congestion. Follow-up PET scan did show a decrease in right upper lobe nodule size down to 1.3 cm.  We will repeat a CT chest in 1 month.      Rexene Edison, NP 06/25/2020

## 2020-06-25 NOTE — Assessment & Plan Note (Signed)
Patient with a 2.1 cm right upper lobe consolidation/nodule with associated air bronchograms November 2021.  Patient was treated with a 10-day course of doxycycline.  Clinically had no infectious symptoms such as cough congestion. Follow-up PET scan did show a decrease in right upper lobe nodule size down to 1.3 cm.  We will repeat a CT chest in 1 month.

## 2020-06-26 ENCOUNTER — Ambulatory Visit (HOSPITAL_COMMUNITY): Payer: Medicare Other

## 2020-06-27 ENCOUNTER — Ambulatory Visit (INDEPENDENT_AMBULATORY_CARE_PROVIDER_SITE_OTHER): Payer: Medicare Other | Admitting: Internal Medicine

## 2020-06-27 ENCOUNTER — Encounter: Payer: Self-pay | Admitting: Internal Medicine

## 2020-06-27 ENCOUNTER — Other Ambulatory Visit: Payer: Self-pay

## 2020-06-27 VITALS — BP 98/64 | HR 66 | Ht 70.0 in | Wt 157.8 lb

## 2020-06-27 DIAGNOSIS — I2510 Atherosclerotic heart disease of native coronary artery without angina pectoris: Secondary | ICD-10-CM

## 2020-06-27 DIAGNOSIS — I480 Paroxysmal atrial fibrillation: Secondary | ICD-10-CM

## 2020-06-27 NOTE — Progress Notes (Signed)
PCP: Alonna Buckler, MD Primary Cardiologist: Dr Gwenlyn Found He is also seen at [facility] when he is there. Primary EP: Dr Elroy Channel Turko is a 86 y.o. male who presents today for routine electrophysiology followup.  Since last being seen in our clinic, the patient reports doing very well.  Today, he denies symptoms of palpitations, chest pain, shortness of breath,  lower extremity edema, dizziness, presyncope, or syncope.  The patient is otherwise without complaint today.   Past Medical History:  Diagnosis Date  . Anxiety   . BPH (benign prostatic hyperplasia)   . CAD (coronary artery disease)   . Chest pain   . Hyperlipidemia   . Osteopenia   . Paroxysmal atrial fibrillation [facility])    Past Surgical History:  Procedure Laterality Date  . ATRIAL FIBRILLATION ABLATION N/A 11/10/2019   Procedure: ATRIAL FIBRILLATION ABLATION;  Surgeon: [name], MD;  Location: [place] [facility];  Service: Cardiovascular;  Laterality: N/A;  . implantable loop recorder placement  03/28/2020    Medtronic Reveal Linq model LNQ 22 (SN [id number] ) implantable loop recorder implanted for afib management  . LEFT HEART CATH AND CORONARY ANGIOGRAPHY N/A 02/05/2017   Procedure: LEFT HEART CATH AND CORONARY ANGIOGRAPHY;  Surgeon: Lorretta Harp, MD;  Location: [place] [facility];  Service: Cardiovascular;  Laterality: N/A;    ROS- all systems are reviewed and negatives except as per HPI above  Current Outpatient Medications  Medication Sig Dispense Refill  . diltiazem (CARDIZEM) 30 MG tablet TAKE 1 TABLET EVERY 4 HOURS AS NEEDED FOR AFIB HEART RATE >100 45 tablet 3  . ELIQUIS 5 MG TABS tablet TAKE 1 TABLET BY MOUTH TWICE A DAY 60 tablet 11  . Evolocumab (REPATHA SURECLICK) 440 MG/ML SOAJ Inject 1 pen into the skin every 14 (fourteen) days. 2 mL 11  . LORazepam (ATIVAN) 0.5 MG tablet Take 1 tablet (0.5 mg total) by mouth every 8 (eight) hours. 30 tablet 1  . mirtazapine (REMERON) 30 MG tablet TAKE  1 TABLET BY MOUTH EVERYDAY AT BEDTIME 90 tablet 1  . Protein POWD Take 1 Scoop by mouth See admin instructions. Take monday- Friday vega essentials shake    . rosuvastatin (CRESTOR) 40 MG tablet Take 1 tablet (40 mg total) by mouth daily. 30 tablet 6  . sertraline (ZOLOFT) 100 MG tablet TAKE 1 TABLET BY MOUTH EVERY DAY 90 tablet 0   No current facility-administered medications for this visit.    Physical Exam: Vitals:   06/27/20 1426  BP: 98/64  Pulse: 66  SpO2: 94%  Weight: 157 lb 12.8 oz (71.6 kg)  Height: 5\' 10"  (1.778 m)    GEN- The patient is well appearing, alert and oriented x 3 today.   Head- normocephalic, atraumatic Eyes-  Sclera clear, conjunctiva pink Ears- hearing intact Oropharynx- clear Lungs- Clear to ausculation bilaterally, normal work of breathing Heart- Regular rate and rhythm, no murmurs, rubs or gallops, PMI not laterally displaced GI- soft, NT, ND, + BS Extremities- no clubbing, cyanosis, or edema  Wt Readings from Last 3 Encounters:  06/27/20 157 lb 12.8 oz (71.6 kg)  06/25/20 153 lb (69.4 kg)  03/28/20 154 lb 3.2 oz (69.9 kg)    EKG tracing ordered today is personally reviewed and shows sinus with PACs  Assessment and Plan:  1. Paroxysmal atrial fibrillation Doing very well post ablation off AAD therapy ILR is reviewed and reveals no afib chads2vasc score is 3.  He is on eliquis  2. CAD No ischemic symptoms  Risks, benefits and potential toxicities for medications prescribed and/or refilled reviewed with patient today.   Return in 6 months  [name] MD, [facility] 06/27/2020 2:40 PM

## 2020-06-27 NOTE — Patient Instructions (Addendum)
Medication Instructions:  Your physician recommends that you continue on your current medications as directed. Please refer to the Current Medication list given to you today.  Labwork: None ordered.  Testing/Procedures: None ordered.  Follow-Up: Your physician wants you to follow-up in: 12/31/20 at 3 pm with Dr. Rayann Heman.   Any Other Special Instructions Will Be Listed Below (If Applicable).  If you need a refill on your cardiac medications before your next appointment, please call your pharmacy.

## 2020-06-28 ENCOUNTER — Ambulatory Visit (HOSPITAL_COMMUNITY): Payer: Medicare Other

## 2020-06-29 ENCOUNTER — Ambulatory Visit (HOSPITAL_COMMUNITY): Payer: Medicare Other

## 2020-07-02 ENCOUNTER — Ambulatory Visit (INDEPENDENT_AMBULATORY_CARE_PROVIDER_SITE_OTHER): Payer: Medicare Other

## 2020-07-02 DIAGNOSIS — I480 Paroxysmal atrial fibrillation: Secondary | ICD-10-CM | POA: Diagnosis not present

## 2020-07-03 NOTE — Progress Notes (Deleted)
{Choose 1 Note Type (Telehealth Visit or Telephone Visit):[phone]}  Evaluation Performed:  Follow-up visit  This visit type was conducted due to national recommendations for restrictions regarding the COVID-19 Pandemic (e.g. social distancing).  This format is felt to be most appropriate for this patient at this time.  All issues noted in this document were discussed and addressed.  No physical exam was performed (except for noted visual exam findings with Video Visits).  Please refer to the patient's chart (MyChart message for video visits and phone note for telephone visits) for the patient's consent to telehealth for [place]  Date:  07/03/2020   ID:  Derrick Compton, DOB 26-Aug-1934, MRN [id number]  Patient Location: *** [address] [place] [address]   Provider location:     [place] [address] Office [phone] Fax [phone]   PCP:  Alonna Buckler, MD  Cardiologist:  Quay Burow, MD *** Electrophysiologist:  None   Chief Complaint: Follow-up for coronary artery disease  History of Present Illness:    Derrick Compton is a 86 y.o. male who presents via audio/video conferencing for a telehealth visit today.  Patient verified DOB and address.  Derrick Compton a past medical history of coronary artery disease with cardiac catheterization on 02/05/2017 that showed 90% ostial first diagonal branch stenosis coming off orthogonal to the LAD making PCI high risk. His cardiac catheterization showed no other significant CAD andmedical management was chosen as the best course of therapy. He had a stress test on 10/2016 which was negative however, he did have a positive CT FFR of a diagonal branch 12/2016 due to his hypotension and bradycardia he was not a good candidate for anti-anginal medications. An echocardiogram on 5/29 /2020 showed an EF of 55 to 60% and mild stable aortic valve stenosis with a peak gradient  of 15 mmHg. He also has hyperlipidemia which is managed by his PCP.  He had pneumonia symptoms January 2020, was given antibiotics and his follow-up film indicated pulmonary infiltrates in the right upper lobe. Seen by Dr. Chase Caller on 03/14/2019. He reviewed a chest CT from 02/23/2019 that was taken at the [facility] clinic in [place] which showed small infiltrates. He plans to repeat CT chest without contrast at the end of December 2020.  He was last seen by Dr. Gwenlyn Found on 09/22/2018. During that time he was doing well. He denied chest pain and continued to walk 5 to 6 miles daily.  He presented to the clinic on 04/06/2019 and stated he hadchest discomfort when he  ran or ran upstairs. He continued to walk 5 to 6 miles per day with no chest discomfort. He stated that over the last 6 months he had noticed with more depression. He had been working with a psychiatrist that is prescribing sertraline.  I  started Ranexa 500 mg twice daily.  He presented to the clinic 04/20/2019 and stated he had continued to use his elliptical for 30 to 45 minutes at a time and walk up the stairs at the [facility] building.  He stated that he continued to have exertional chest pain.  He stated that  While on the elliptical he had chest discomfort for about 5 minutes that was mild in nature.  He continued to exercise and chest discomfort dissipated.  He went on to state that as soon as he starts to increase his pace or jog for example his chest discomfort reappears.  He stated that  since he was at the last visit his breathing had been better.  He presented to his primary for a follow-up chest x-ray which showed his pneumonia had cleared.  He went on to state that he had started to use this mindfulness meditation information/handout that was given to him during the last visit and his anxiety had lessened.  I  increased his Ranexa to 1000 mg twice daily and schedule him for a follow-up appointment in 1 month.   He  contacted nurse triage line on 05/05/2019.  He indicated this heart rate had increased to the 179-200 bpm range and would occur 3-4 times per day.  This is been ongoing for 2 weeks.  A 14-day Zio patch was ordered which showed 2% atrial fibrillation burden and 45 episodes of SVT.  He was seen by Roby Lofts, PA-C who discussed SVT and atrial fibrillation.  They discussed vagal errors for managing SVT.  Follow-up with Dr. Rayann Heman  2/22 and continue to do well status post ablation.  CHA2DS2-VASc  3 on Eliquis.  His ILR was reviewed and showed no atrial fibrillation.  He is connected with virtually today and states***  He denies chest pain at rest, shortness of breath, lower extremity edema, fatigue, palpitations, melena, hematuria, hemoptysis, diaphoresis, weakness, presyncope, syncope, orthopnea, and PND.    The patient {does/does not:200015} symptoms concerning for COVID-19 infection (fever, chills, cough, or new SHORTNESS OF BREATH).    Prior CV studies:   The following studies were reviewed today:  EKG 04/20/2019  sinus bradycardia with sinus arrhythmia incomplete right bundle branch block left anterior fascicular block anterior septal infarct undetermined age 19 bpm.  QTC stable at 387  EKG 04/06/2019 sinus bradycardia premature atrial complexes 52 bpm-QTC 373  EKG 08/19/2017 Sinus bradycardia with premature atrial complexes, incomplete right bundle branch block, left anterior fascicular block 52 bpm  Echocardiogram 10/22/2018 IMPRESSIONS   1. The left ventricle has normal systolic function, with an ejection fraction of 55-60%. The cavity size was normal. Left ventricular diastolic Doppler parameters are consistent with impaired relaxation. Indeterminate filling pressures. 2. The right ventricle has normal systolic function. The cavity was normal. There is no increase in right ventricular wall thickness. Right ventricular systolic pressure could not be assessed. 3. The  mitral valve is abnormal. Mild thickening of the mitral valve leaflet. There is mild mitral annular calcification present. 4. The aortic valve is abnormal. Mild calcification of the aortic valve. Aortic valve regurgitation is trivial by color flow Doppler. Mild stenosis of the aortic valve. Maximum velocity obtained in the apical view. 5. AV Mean Sys Grad:9.0 mmHg. 6. There is mild dilatation of the ascending aorta measuring 41 mm.  Past Medical History:  Diagnosis Date  . Anxiety   . BPH (benign prostatic hyperplasia)   . CAD (coronary artery disease)   . Chest pain   . Hyperlipidemia   . Osteopenia   . Paroxysmal atrial fibrillation [facility])    Past Surgical History:  Procedure Laterality Date  . ATRIAL FIBRILLATION ABLATION N/A 11/10/2019   Procedure: ATRIAL FIBRILLATION ABLATION;  Surgeon: [name], MD;  Location: [place] [facility];  Service: Cardiovascular;  Laterality: N/A;  . implantable loop recorder placement  03/28/2020    Medtronic Reveal Linq model LNQ 22 (SN [id number] ) implantable loop recorder implanted for afib management  . LEFT HEART CATH AND CORONARY ANGIOGRAPHY N/A 02/05/2017   Procedure: LEFT HEART CATH AND CORONARY ANGIOGRAPHY;  Surgeon: Lorretta Harp, MD;  Location: [place] [facility];  Service: Cardiovascular;  Laterality: N/A;     No outpatient medications have been marked as taking for the 07/04/20 encounter (Appointment) with Deberah Pelton, NP.     Allergies:   Patient has no known allergies.   Social History   Tobacco Use  . Smoking status: Never Smoker  . Smokeless tobacco: Never Used  Substance Use Topics  . Alcohol use: Yes    Alcohol/week: 2.0 standard drinks    Types: 2 Standard drinks or equivalent per week  . Drug use: No     Family Hx: The patient's family history is not on file.  ROS:   Please see the history of present illness.     All other systems reviewed and are negative.   Labs/Other Tests and Data Reviewed:     Recent Labs: 11/07/2019: BUN 16; Creatinine, Ser 0.88; Hemoglobin 13.4; Platelets 237; Potassium 4.1; Sodium 140 01/04/2020: ALT 31   Recent Lipid Panel Lab Results  Component Value Date/Time   CHOL 101 03/30/2020 09:01 AM   TRIG 74 03/30/2020 09:01 AM   HDL 50 03/30/2020 09:01 AM   CHOLHDL 2.0 03/30/2020 09:01 AM   CHOLHDL 3.1 02/04/2011 03:09 PM   LDLCALC 36 03/30/2020 09:01 AM    Wt Readings from Last 3 Encounters:  06/27/20 157 lb 12.8 oz (71.6 kg)  06/25/20 153 lb (69.4 kg)  03/28/20 154 lb 3.2 oz (69.9 kg)     Exam:    Vital Signs:  There were no vitals taken for this visit.   Well nourished, well developed male in no  acute distress.   ASSESSMENT & PLAN:    1.  Atrial fibrillation-heart rate today ***.  Status post ablation 11/10/2019. Continue Eliquis, diltiazem Heart healthy low-sodium diet-salty 6 given Increase physical activity as tolerated     Coronary artery disease-no chest pain today.  ***Continues to have chest pain with activity such as running. No chest discomfort at rest. Cardiac catheterization on 02/05/2017 that showed 90% ostial first diagonal branch stenosis coming off orthogonal to the LAD making PCI high risk. His cardiac catheterization showed no other significant CAD and medical management was chosen as the best course of therapy. Continue rosuvastatin20 mg tablet daily Continue stress reduction techniques-information given, follow-up/findCBT counselor.  Hyperlipidemia-LDL 113 02/04/2011 Continue rosuvastatin, Repatha Followed by PCP  Aortic stenosis-***echocardiogram on 12/09/2016 showed mild aortic stenosis with a peak gradient of 21 mmHg. His repeat echocardiogram on 10/22/2018 showed an EF of 55 to 60% with stable aortic valve stenosis and a peak gradient of 15 mmHg Repeat when clinically indicated  Shortness of breath-resolved. He had pneumonia symptoms January 2020, was given antibiotics and his follow-up film indicated  pulmonary infiltrates in the right upper lobe. Seen by Dr. Chase Caller on 03/14/2019. He reviewed a chest CT from 02/23/2019 that was taken at the [facility] clinic in [place] which showed small infiltrates. He plans to repeat CT chest without contrast at the end of December 2020. Followed by pulmonology   Disposition: Follow-up with  Dr. Gwenlyn Found or me in 6 months.  COVID-19 Education: The signs and symptoms of COVID-19 were discussed with the patient and how to seek care for testing (follow up with PCP or arrange E-visit).  The importance of social distancing was discussed today.  Patient Risk:   After full review of this patients clinical status, I feel that they are at least moderate risk at this time.  Time:   Today, I have spent *** minutes with the patient with telehealth  technology discussing ***.     Medication Adjustments/Labs and Tests Ordered: Current medicines are reviewed at length with the patient today.  Concerns regarding medicines are outlined above.   Tests Ordered: No orders of the defined types were placed in this encounter.  Medication Changes: No orders of the defined types were placed in this encounter.   Disposition:  {follow up:15908}  Signed, Jossie Ng. Drury Ardizzone NP-C    12/28/2018 11:58 AM    [place] [address] Office [phone] Fax [phone]

## 2020-07-04 ENCOUNTER — Telehealth: Payer: Medicare Other | Admitting: General Practice

## 2020-07-06 LAB — CUP PACEART REMOTE DEVICE CHECK
Date Time Interrogation Session: 20220211063429
Implantable Pulse Generator Implant Date: 20211103

## 2020-07-06 NOTE — Progress Notes (Signed)
Carelink Summary Report / Loop Recorder 

## 2020-07-17 ENCOUNTER — Ambulatory Visit
Admission: RE | Admit: 2020-07-17 | Discharge: 2020-07-17 | Disposition: A | Discharge status: Home / Self Care | Payer: Medicare Other | Source: Ambulatory Visit | Attending: Adult Health | Admitting: Adult Health

## 2020-07-17 DIAGNOSIS — R918 Other nonspecific abnormal finding of lung field: Secondary | ICD-10-CM

## 2020-07-18 ENCOUNTER — Other Ambulatory Visit: Payer: Self-pay

## 2020-07-18 ENCOUNTER — Ambulatory Visit (HOSPITAL_COMMUNITY): Payer: Medicare Other | Attending: Cardiology

## 2020-07-18 DIAGNOSIS — I350 Nonrheumatic aortic (valve) stenosis: Secondary | ICD-10-CM | POA: Insufficient documentation

## 2020-07-18 LAB — ECHOCARDIOGRAM COMPLETE
AR max vel: 2.42 cm2
AV Area VTI: 2.32 cm2
AV Area mean vel: 2.09 cm2
AV Mean grad: 8.7 mmHg
AV Peak grad: 15.1 mmHg
Ao pk vel: 1.94 m/s
Area-P 1/2: 1.83 cm2
S' Lateral: 3.1 cm

## 2020-07-24 ENCOUNTER — Other Ambulatory Visit: Payer: Self-pay

## 2020-07-24 ENCOUNTER — Encounter: Payer: Self-pay | Admitting: Internal Medicine

## 2020-07-24 ENCOUNTER — Ambulatory Visit (INDEPENDENT_AMBULATORY_CARE_PROVIDER_SITE_OTHER): Payer: Medicare Other | Admitting: Internal Medicine

## 2020-07-24 VITALS — BP 110/60 | HR 65 | Temp 97.5°F | Ht 70.0 in | Wt 156.0 lb

## 2020-07-24 DIAGNOSIS — R42 Dizziness and giddiness: Secondary | ICD-10-CM

## 2020-07-24 DIAGNOSIS — R0600 Dyspnea, unspecified: Secondary | ICD-10-CM | POA: Diagnosis not present

## 2020-07-24 DIAGNOSIS — I2510 Atherosclerotic heart disease of native coronary artery without angina pectoris: Secondary | ICD-10-CM

## 2020-07-24 DIAGNOSIS — R918 Other nonspecific abnormal finding of lung field: Secondary | ICD-10-CM | POA: Diagnosis not present

## 2020-07-24 NOTE — Patient Instructions (Signed)
Pulmonary infiltrate present on computed tomography Lung nodules   -In February 2022 right upper lobe nodule is stable compared to December 2020 but there is new right lower lobe groundglass opacity.  This could be tied to postnasal drip.  Rarely it can be infection/inflammation  Plan -Control postnasal drip   -Use saline nasal spray daily 2 squirts into each nostril  -  take generic fluticasone inhaler 2 squirts each nostril daily  - check Quantiferon gold, ANA, ACE RF, CCP, MPO, PR-3 and GBM antibodies and ESR  -Repeat CT scan of the chest without contrast in 6 months  Dizziness on standing Dyspnea, unspecified type  - could be age related vascular tone  - could be exercise induced asthma  Plan  - try pro-air prn 15 min before weight training   Follow-up -Return in 6 months or sooner if needed

## 2020-07-24 NOTE — Progress Notes (Signed)
OV 03/14/2019  Subjective:  Patient ID: Derrick Compton, male , DOB: 12-26-1934 , age 86 y.o. , MRN: [id number] , ADDRESS: [place] [address]   03/14/2019 -   Chief Complaint  Patient presents with  . Consult    Abnormal CT      HPI Derrick Compton 86 y.o. -presents for evaluation of pulmonary infiltrate/nodule.  He tells me and this is confirmed by review of the outside medical records in [place].  In January 2020 he had pneumonia symptoms.  He was given antibiotics.  He did have a follow-up film and that showed pulmonary infiltrates according to his history and the right upper lobe.  He thinks he had a follow-up CT scan of the chest in April 2020 but I do not see this in the outside records.  Insert all I see is the outside CT chest August 17, 2018 that show some right upper lobe infiltrates that has been reported as residua from his pneumonia.  But at this time he started feeling well.  He has continued to feel well.  He says he never had COVID-19.  He has never been tested for COVID-19.  He spends his time shortening between [place] and [place] with his RV.  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  He states in September 2020 he went to the [facility] for a full physical.  He says he had a CT scan of the chest there.  Review of the chart indicates that he had a CT scan of the coronaries [he is already had CT scan of the coronaries in 2018 within our health system and this did not show any pulmonary infiltrates back pain].  I do not have the images with me.  On the official report this seems to be some residual mild infiltrates and the radiologist recommended a follow-up in 3 months which would be around Christmas 2020.  At this point in time he has no shortness of breath or cough or fever.  His ECOG is 0.  He is fit and functional.  Works out daily with walking exercises.  Of note: He did bring his CD-ROM images and dropped off in our front  desk a few days ago but at this point in time it is missing.  He is quite upset about this.     ROS - per HPI  March 2020 CT image [facility] - result only, image NA   IMPRESSION: 1. Residual patchy right upper and lower lobe ground-glass opacities, improved from baseline radiographs of 06/13/2018, most consistent with resolving acute atypical infection, including viral pneumonia. 2. No consolidation, pleural effusion or adenopathy. 3. Assuming the patient is clinically improving, chest radiographic follow-up in approximately 1 month suggested. 4. Coronary and Aortic Atherosclerosis (ICD10-I70.0).   Electronically Signed  By: Richardean Sale M.D.  On: 08/17/2018 15:24   CT [facility] Feb 23, 2019 - [facility] - Coronary CT   IMPRESSION:  1. Coronary Agatston Calcium Score of 196. Please correlate with MESA   (multi-ethnic study of atherosclerosis) or use website risk calculator.   ** http://www.mesa-nhlbi.org/Calcium/input.aspx  2. Minimal nonspecific groundglass opacities in the right lung, likely   infectious in etiology. Follow-up with CT chest in 3 months is   recommended to assess for resolution.  3. Bilateral nonobstructive nephrolithiasis, left greater than right.  4. Status post right hemicolectomy with ileocolonic anastomosis.  Transcriptionist: PSCB   Transcribe Date/Time: Feb 23 2019 2:32P    Dictated by : Nash Shearer, MD    This examination was interpreted and the report reviewed and   electronically signed by:   Nash Shearer, MD on Feb 23 2019 2:46PM EST   has a past medical history of Anxiety, BPH (benign prostatic hyperplasia), Chest pain, Hyperlipidemia, and Osteopenia.   reports that he has never smoked. He has never used smokeless tobacco.  Past Surgical History:  Procedure Laterality Date  . LEFT HEART CATH AND CORONARY ANGIOGRAPHY N/A 02/05/2017   Procedure: LEFT HEART CATH AND  CORONARY ANGIOGRAPHY;  Surgeon: Lorretta Harp, MD;  Location: [facility];  Service: Cardiovascular;  Laterality: N/A;    No Known Allergies   There is no immunization history on file for this patient.  No family history on file.   Current Outpatient Medications:  .  ALPRAZolam (XANAX) 0.5 MG tablet, Take 1 tablet (0.5 mg total) by mouth at bedtime as needed., Disp: 90 tablet, Rfl: 1 .  amLODipine (NORVASC) 5 MG tablet, Take 1 tablet (5 mg total) by mouth daily., Disp: 90 tablet, Rfl: 2 .  Cholecalciferol (VITAMIN D3) 25 MCG (1000 UT) CAPS, Vitamin D3 25 mcg (1,000 unit) capsule  Take 1 capsule every day by oral route., Disp: , Rfl:  .  lithium carbonate 150 MG capsule, Take 1 capsule (150 mg total) by mouth at bedtime., Disp: 90 capsule, Rfl: 1 .  meclizine (ANTIVERT) 25 MG tablet, Take 25 mg by mouth 2 (two) times daily as needed for dizziness or nausea. , Disp: , Rfl: 0 .  rosuvastatin (CRESTOR) 40 MG tablet, Take 0.5 tablets by mouth daily., Disp: , Rfl: 3 .  sertraline (ZOLOFT) 100 MG tablet, TAKE 1 TABLET BY MOUTH EVERY DAY, Disp: 90 tablet, Rfl: 0    06/25/2020 Follow up ; Lung nodule  Patient returns for a follow-up visit.  Last seen in the office October 2020.  Patient is being followed for lung nodules on CT as above.  Recently a follow-up CT chest at [facility] in November showed a new right upper lobe lung nodule.  Notes reviewed from [facility] clinic in [place].  Patient had CT chest April 03, 2020 that showed a 2.1 cm irregular nodule with groundglass opacities, with air bronchograms. He did not have .  Patient was set up for a PET scan.  This showed a decrease in size of right upper lobe lung nodule from 2.1 cm to 1.3 cm.  There was mildly FDG avid (SUV max 2.6). Patient was treated with a 10-day course of doxycycline and recommended to repeat a CT chest in 3 to 6 months. We discussed repeating CT in March 2022. (4 month interval) .  No hemoptysis , weight  loss, dysphagia , GERD or chronic sinus issues.  Denies cough . Occasional tickle in throat .   Follows with [facility] yearly for full physical . Lives locally but has property in [place].   Covid Vaccine x 3  Exercise 5-6 x weeks. Very active and independent.   CT coronary scan September 12, 2019 multiple small pulmonary nodules measuring 2 to 3 mm scattered throughout the lungs bilaterally nonspecific  CT chest 04/03/20 There is an area of nodular consolidation within the right  upper lobe, with air bronchograms along the lower margin of the  nodularity, measuring approximately 2.1 cm in size (see series 3 image  53). This is in the region of  the previously described groundglass  nodule on the prior examination. A few other small scattered tiny  pulmonary nodules, for example a 3 mm nodule in the right lower lobe  (series 3 image 146) do not appear significantly changed. (from care everywhere )   PET 04/11/2020 A 1.3 cm mildly FDG avid (SUV max = 2.6) nodule in the right upper  lobe has decreased in size since the previous exam when it measured 2.1  cm. * Subcentimeter focus of mildly increased FDG uptake (SUV max = 3.3) in  the left hilum. * Coronary artery calcifications. 1. The right upper lobe lung nodule has decreased in size since the  previous exam and shows mild FDG uptake. Infection is favored over  neoplasm. Follow-up CT scan of the chest is recommended in 1-3 months to  show resolution. 2. Small focus of mildly increased FDG uptake in the left hilum, likely  a reactive lymph node. 3. Please see body of the report for more findings.      OV 07/24/2020  Subjective:  Patient ID: Derrick Compton, male , DOB: 28-Dec-1934 , age 89 y.o. , MRN: [id number] , ADDRESS: [place] [address] PCP Alonna Buckler, MD Patient Care Team: Alonna Buckler, MD as PCP - General (Family Medicine) Lorretta Harp, MD as PCP - Cardiology (Cardiology)  This Provider for  this visit: Treatment Team:  Attending Provider: Brand Males, MD    07/24/2020 -   Chief Complaint  Patient presents with  . Follow-up    Doing well   Follow-up right upper lobe nodule  HPI Derrick Compton 85 y.o. -returns for follow-up.  He periodically visits [place] near [employer].  But he mostly is in [place].  His last CT scan of the chest without contrast in [place] was in December 2020.  He had a follow-up CT scan of the chest in February 2022 the right upper lobe groundglass opacity density is reported as stable.  In the interim he did have [facility] CT scan and PET scan where they reported a decrease in this area and a low uptake.  He is on observation.  On the current CT scan that I personally visualized is a new right lower lobe groundglass opacity that is hazy.  He denies any hemoptysis or weight loss or fever or chills.  He does admit to postnasal drip.  He does not treat this.  Associated with this he has a cough.  He denies any dysphagia or aspiration but occasionally food will get stuck in his throat.  But definitely no aspiration.  He does not have any poor dentition.  No alcohol use no syncope.  He has a remote history of colon cancer.  He is worried if this could be metastasis.    CT Chest data 07/17/20   IMPRESSION: 1. Since the diagnostic chest CT of 05/13/2019, primarily similar posterior right upper lobe areas of airspace and ground-glass opacity, favoring chronic infection. 2. New right lower lobe multifocal peribronchovascular ground-glass, favored to also be due to interval infection or inflammation. Per consensus criteria, this warrants follow-up chest CT at 6 months. This recommendation follows the consensus statement: Guidelines for Management of Incidental Pulmonary Nodules Detected on CT Images:From the Fleischner Society 2017; published online before print (10.1148/radiol.[id number]). 3. Coronary artery atherosclerosis. Aortic  Atherosclerosis (ICD10-I70.0). 4. Aortic valvular calcifications. Consider echocardiography to evaluate for valvular dysfunction. 5. Mild right-sided gynecomastia.   Electronically Signed   By: Abigail Miyamoto M.D.   On:  07/18/2020 09    PFT  No flowsheet data found.     has a past medical history of Anxiety, BPH (benign prostatic hyperplasia), CAD (coronary artery disease), Chest pain, Hyperlipidemia, Osteopenia, and Paroxysmal atrial fibrillation ([place]).   reports that he has never smoked. He has never used smokeless tobacco.  Past Surgical History:  Procedure Laterality Date  . ATRIAL FIBRILLATION ABLATION N/A 11/10/2019   Procedure: ATRIAL FIBRILLATION ABLATION;  Surgeon: [name], MD;  Location: [facility];  Service: Cardiovascular;  Laterality: N/A;  . implantable loop recorder placement  03/28/2020    Medtronic Reveal Linq model LNQ 22 (SN [id number] ) implantable loop recorder implanted for afib management  . LEFT HEART CATH AND CORONARY ANGIOGRAPHY N/A 02/05/2017   Procedure: LEFT HEART CATH AND CORONARY ANGIOGRAPHY;  Surgeon: Lorretta Harp, MD;  Location: [facility];  Service: Cardiovascular;  Laterality: N/A;    No Known Allergies  Immunization History  Administered Date(s) Administered  . Fluad Quad(high Dose 65+) 03/06/2019  . Influenza Split 05/26/2013  . Influenza, High Dose Seasonal PF 04/25/2016, 04/25/2016, 05/06/2017, 05/04/2018, 05/04/2018, 03/06/2019  . Influenza, Seasonal, Injecte, Preservative Fre 04/19/2012, 05/26/2012  . Influenza,inj,quad, With Preservative 04/19/2012, 05/26/2012, 05/26/2013, 02/24/2015  . Influenza-Unspecified 04/19/2012, 05/26/2012, 05/26/2013, 02/24/2015  . Moderna Sars-Covid-2 Vaccination 09/24/2019, 10/25/2019  . Pneumococcal Conjugate-13 10/05/2014, 10/05/2014  . Pneumococcal Polysaccharide-23 05/26/2000, 05/26/2000, 05/27/2007, 05/27/2007  . Td 05/27/2003, 05/27/2003  . Tdap 05/27/2003, 04/19/2012,  04/19/2012  . Zoster 05/26/2005, 05/26/2005  . Zoster Recombinat (Shingrix) 04/23/2019    History reviewed. No pertinent family history.   Current Outpatient Medications:  .  diltiazem (CARDIZEM) 30 MG tablet, TAKE 1 TABLET EVERY 4 HOURS AS NEEDED FOR AFIB HEART RATE >100, Disp: 45 tablet, Rfl: 3 .  ELIQUIS 5 MG TABS tablet, TAKE 1 TABLET BY MOUTH TWICE A DAY, Disp: 60 tablet, Rfl: 11 .  Evolocumab (REPATHA SURECLICK) 409 MG/ML SOAJ, Inject 1 pen into the skin every 14 (fourteen) days., Disp: 2 mL, Rfl: 11 .  LORazepam (ATIVAN) 0.5 MG tablet, Take 1 tablet (0.5 mg total) by mouth every 8 (eight) hours., Disp: 30 tablet, Rfl: 1 .  mirtazapine (REMERON) 30 MG tablet, TAKE 1 TABLET BY MOUTH EVERYDAY AT BEDTIME, Disp: 90 tablet, Rfl: 1 .  Protein POWD, Take 1 Scoop by mouth See admin instructions. Take monday- Friday vega essentials shake, Disp: , Rfl:  .  rosuvastatin (CRESTOR) 40 MG tablet, Take 1 tablet (40 mg total) by mouth daily., Disp: 30 tablet, Rfl: 6 .  sertraline (ZOLOFT) 100 MG tablet, TAKE 1 TABLET BY MOUTH EVERY DAY, Disp: 90 tablet, Rfl: 0      Objective:   Vitals:   07/24/20 0905  BP: 110/60  Pulse: 65  Temp: (!) 97.5 F (36.4 C)  TempSrc: Oral  SpO2: 100%  Weight: 156 lb (70.8 kg)  Height: _0  (1.778 m)    Estimated body mass index is 22.38 kg/m as calculated from the following:   Height as of this encounter: _1  (1.778 m).   Weight as of this encounter: 156 lb (70.8 kg).  _2 @  [facility] Weights   07/24/20 0905  Weight: 156 lb (70.8 kg)     Physical Exam   General: No distress. Look swel Neuro: Alert and Oriented x 3. GCS 15. Speech normal Psych: Pleasant Resp:  Barrel Chest - no.  Wheeze - no, Crackles - no, No overt respiratory distress CVS: Normal heart sounds. Murmurs - no Ext: Stigmata of  Connective Tissue Disease - no HEENT: Normal upper airway. PEERL +. No post nasal drip        Assessment:       ICD-10-CM   1. Pulmonary  infiltrate present on computed tomography  R91.8   2. Lung nodules  R91.8   3. Dizziness on standing  R42   4. Dyspnea, unspecified type  R06.00        Plan:     Patient Instructions  Pulmonary infiltrate present on computed tomography Lung nodules   -In February 2022 right upper lobe nodule is stable compared to December 2020 but there is new right lower lobe groundglass opacity.  This could be tied to postnasal drip.  Rarely it can be infection/inflammation  Plan -Control postnasal drip   -Use saline nasal spray daily 2 squirts into each nostril  -  take generic fluticasone inhaler 2 squirts each nostril daily  - check Quantiferon gold, ANA, ACE RF, CCP, MPO, PR-3 and GBM antibodies and ESR  -Repeat CT scan of the chest without contrast in 6 months  Dizziness on standing Dyspnea, unspecified type  - could be age related vascular tone  - could be exercise induced asthma  Plan  - try pro-air prn 15 min before weight training   Follow-up -Return in 6 months or sooner if needed     SIGNATURE    Dr. Brand Males, M.D., F.C.C.P,  Pulmonary and Critical Care Medicine Staff Physician, [place] Director - Interstitial Lung Disease  Program  [facility] [place] [facility] [place], [place], [address]  Pager: [phone], If no answer or between  15:00h - 7:00h: call [phone] Telephone: [phone]  9:32 AM 07/24/2020

## 2020-07-26 LAB — GLOMERULAR BASEMENT MEMBRANE ANTIBODIES: GBM Ab: <1 AI

## 2020-07-26 LAB — ANA,IFA RA DIAG PNL W/RFLX TIT/PATN
Anti Nuclear Antibody (ANA): POSITIVE — AB
Cyclic Citrullin Peptide Ab: <16 UNITS
Rheumatoid fact SerPl-aCnc: <14 IU/mL (ref <14)

## 2020-07-26 LAB — MPO/PR-3 (ANCA) ANTIBODIES
Myeloperoxidase Abs: <1 AI
Serine Protease 3: <1 AI

## 2020-07-26 LAB — ANTI-NUCLEAR AB-TITER (ANA TITER): ANA Titer 1: 1:40 {titer} — ABNORMAL HIGH

## 2020-07-26 LAB — QUANTIFERON-TB GOLD PLUS
Mitogen-NIL: 9.58 IU/mL
NIL: 0.03 IU/mL
QuantiFERON-TB Gold Plus: NEGATIVE
TB1-NIL: 0 IU/mL
TB2-NIL: 0 IU/mL

## 2020-07-26 LAB — ANGIOTENSIN CONVERTING ENZYME: Angiotensin-Converting Enzyme: 18 U/L (ref 9–67)

## 2020-08-02 ENCOUNTER — Ambulatory Visit (INDEPENDENT_AMBULATORY_CARE_PROVIDER_SITE_OTHER): Payer: Medicare Other

## 2020-08-02 DIAGNOSIS — I480 Paroxysmal atrial fibrillation: Secondary | ICD-10-CM | POA: Diagnosis not present

## 2020-08-08 LAB — MYOMARKER 3 PLUS PROFILE (RDL)
Anti-EJ Ab (RDL): NEGATIVE
Anti-Jo-1 Ab (RDL): <20 Units (ref <20)
Anti-Ku Ab (RDL): NEGATIVE
Anti-MDA-5 Ab (CADM-140)(RDL): <20 Units (ref <20)
Anti-Mi-2 Ab (RDL): NEGATIVE
Anti-NXP-2 (P140) Ab (RDL): <20 Units (ref <20)
Anti-OJ Ab (RDL): NEGATIVE
Anti-PL-12 Ab (RDL: NEGATIVE
Anti-PL-7 Ab (RDL): NEGATIVE
Anti-PM/Scl-100 Ab (RDL): 27 Units — ABNORMAL HIGH (ref <20)
Anti-SAE1 Ab, IgG (RDL): <20 Units (ref <20)
Anti-SRP Ab (RDL): NEGATIVE
Anti-SS-A 52kD Ab, IgG (RDL): <20 Units (ref <20)
Anti-TIF-1gamma Ab (RDL): <20 Units (ref <20)
Anti-U1 RNP Ab (RDL): <20 Units (ref <20)
Anti-U2 RNP Ab (RDL): NEGATIVE
Anti-U3 RNP (Fibrillarin)(RDL): NEGATIVE

## 2020-08-09 ENCOUNTER — Encounter: Payer: Self-pay | Admitting: Psychiatry

## 2020-08-09 ENCOUNTER — Ambulatory Visit (INDEPENDENT_AMBULATORY_CARE_PROVIDER_SITE_OTHER): Payer: Medicare Other | Admitting: Psychiatry

## 2020-08-09 ENCOUNTER — Other Ambulatory Visit: Payer: Self-pay

## 2020-08-09 DIAGNOSIS — F331 Major depressive disorder, recurrent, moderate: Secondary | ICD-10-CM | POA: Diagnosis not present

## 2020-08-09 DIAGNOSIS — F4001 Agoraphobia with panic disorder: Secondary | ICD-10-CM | POA: Diagnosis not present

## 2020-08-09 DIAGNOSIS — F411 Generalized anxiety disorder: Secondary | ICD-10-CM

## 2020-08-09 DIAGNOSIS — I2510 Atherosclerotic heart disease of native coronary artery without angina pectoris: Secondary | ICD-10-CM | POA: Diagnosis not present

## 2020-08-09 DIAGNOSIS — F5105 Insomnia due to other mental disorder: Secondary | ICD-10-CM

## 2020-08-09 LAB — CUP PACEART REMOTE DEVICE CHECK
Date Time Interrogation Session: 20220316063428
Implantable Pulse Generator Implant Date: 20211103

## 2020-08-09 MED ORDER — DULOXETINE HCL 30 MG PO CPEP: ORAL_CAPSULE | ORAL | 0 refills | Status: DC

## 2020-08-09 NOTE — Progress Notes (Signed)
Derrick Compton [id number] [birth year] 86 y.o.  Subjective:   Patient ID:  Derrick Compton is a 86 y.o. (DOB [birth year]) male.  Chief Complaint:  Chief Complaint  Patient presents with  . Follow-up  . Major depressive disorder, recurrent episode, moderate (HCC)    Depression        Associated symptoms include no decreased concentration and no suicidal ideas.  Derrick Compton presents to the office today for follow-up of TRD  And panic and GAD.   At  visit in December he was depressed.  Started mirtazapine and very low dose lithium.  Awakens with a little depression.  Sleep is pretty good and rarely needs bz.  Depression better after a week or so.  Tolerating meds ok.  Not gaining weight like when on mirtazapine before.   When seen July 28, 2018.  No meds were changed. However at some point he discontinued lithium 150 mg daily.  He stopped bc didn't seem to be doing anything and read about all the side effects.  When seen October 2020 the following observations were made: He has relapsed again and the only change is he stopped lithium.  He doesn't think it helped but I question that. He prefers to increase sertraline over restarting lithium.  Tried to address his fears about lithium. Option increase mirtazapine but may lose sleep benefit. Increase sertraline to 50 mg tabs 3 daily for total of 150 mg daily.  December 2020 appointment the following is noted: More  Groggy with increase sertraline and reduced it back to the original 50 mg daily for about 6 weeks. Went into Mind Stress Management and meditation and it seems to help a good bit.  Thinks a lot of his depression is anxiety over his health fears and the meditation helps to deal with it.  covid didn't help his mood.  Worries over his heart bc he has some issues with it. Can enjoy watching a ball game and function is better.  Doesn't have to stay so busy to be OK now.  Recognizes he needs to change the way he thinks to live with  more peace. Rare alprazolam about 2 times monthly.  Business doing well. Notices calming effect from sertraline.  Fewer speeding tickets. Patient reports stable mood and denies depressed or irritable moods.  Less difficulty with anxiety.  Patient denies difficulty with sleep initiation or maintenance. NO appetite disturbance.  Patient reports that energy and motivation have been good.  Patient denies any difficulty with concentration.  Patient denies any suicidal ideation.  Plan:Continue sertraline 50 Continue mirtazapine 30 HS.  11/22/2019 appointment the following is noted: Not so well.  Afib ablation last Thursday and dosen't feel better. Anxiety is worse esp AM.  Like 2 different people.  Later in day is better. AM don't want to do anything and scared.  Normal by lunch usually. Not taking BZ.  Tried it and gets wiped out. If takes both alprazolam and mirtazapine at night then gets agitated sleep. Sleep good and no panic. Taking mirtazapine 30 and sertraline 100 daily. He doesn't like taking multiple meds. Plan: Do not change AMA. Continue sertraline 100 Continue mirtazapine 30 HS. Restart lithium 150 which helped before. Change alprazolam 0.5 to lorazepam 0.5 prn for better tolerability.  08/09/2020 appointment with the following noted: Didn't take lithium long bc it didn't work. Last 3 mos doesn't want to get OOB and is anxious but once up and working feels a lot better.  Little things cause more  anxiety.  The other 6 mos was pretty good.  Not unhappy but doesn't get excited about anything. No problems with sleep and looks forward to going to bed. Only drinks when goes out and at most 2 and when does the whole world feels great. Work and home life good.  Some worry age related.  No panic.  No SI  Past psychiatric medications Wellbutrin 150, citalopram, paroxetine, Deplin,  fluoxetine side effects, sertraline 150  Groggy Mirtazapine 45 drowsy buspirone, Abilify 2 mg, Rexulti 2 mg,  lithium 150 Lorazepam, alprazolam 0.5  Review of Systems:  Review of Systems  Constitutional: Negative for activity change and unexpected weight change.  Neurological: Negative for tremors and weakness.  Psychiatric/Behavioral: Positive for depression and dysphoric mood. Negative for agitation, behavioral problems, confusion, decreased concentration, hallucinations, self-injury, sleep disturbance and suicidal ideas. The patient is nervous/anxious. The patient is not hyperactive.     Medications: I have reviewed the patient's current medications.  Current Outpatient Medications  Medication Sig Dispense Refill  . diltiazem (CARDIZEM) 30 MG tablet TAKE 1 TABLET EVERY 4 HOURS AS NEEDED FOR AFIB HEART RATE >100 45 tablet 3  . ELIQUIS 5 MG TABS tablet TAKE 1 TABLET BY MOUTH TWICE A DAY 60 tablet 11  . Evolocumab (REPATHA SURECLICK) 182 MG/ML SOAJ Inject 1 pen into the skin every 14 (fourteen) days. 2 mL 11  . LORazepam (ATIVAN) 0.5 MG tablet Take 1 tablet (0.5 mg total) by mouth every 8 (eight) hours. 30 tablet 1  . mirtazapine (REMERON) 30 MG tablet TAKE 1 TABLET BY MOUTH EVERYDAY AT BEDTIME 90 tablet 1  . Protein POWD Take 1 Scoop by mouth See admin instructions. Take monday- Friday vega essentials shake    . rosuvastatin (CRESTOR) 40 MG tablet Take 1 tablet (40 mg total) by mouth daily. 30 tablet 6  . sertraline (ZOLOFT) 100 MG tablet TAKE 1 TABLET BY MOUTH EVERY DAY 90 tablet 0   No current facility-administered medications for this visit.    Medication Side Effects: None  Allergies: No Known Allergies  Past Medical History:  Diagnosis Date  . Anxiety   . BPH (benign prostatic hyperplasia)   . CAD (coronary artery disease)   . Chest pain   . Hyperlipidemia   . Osteopenia   . Paroxysmal atrial fibrillation ([place])     History reviewed. No pertinent family history.  Social History   Socioeconomic History  . Marital status: Married    Spouse name: Not on file  . Number of  children: Not on file  . Years of education: Not on file  . Highest education level: Not on file  Occupational History  . Not on file  Tobacco Use  . Smoking status: Never Smoker  . Smokeless tobacco: Never Used  Substance and Sexual Activity  . Alcohol use: Yes    Alcohol/week: 2.0 standard drinks    Types: 2 Standard drinks or equivalent per week  . Drug use: No  . Sexual activity: Not on file  Other Topics Concern  . Not on file  Social History Narrative   Lives in [place] with wife.      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   Social Determinants of Health   Financial Resource Strain: Not on file  Food Insecurity: Not on file  Transportation Needs: Not on file  Physical Activity: Not on file  Stress: Not on file  Social Connections: Not on file  Intimate Partner Violence: Not on file  Past Medical History, Surgical history, Social history, and Family history were reviewed and updated as appropriate.   Please see review of systems for further details on the patient's review from today.   Objective:   Physical Exam:  There were no vitals taken for this visit.  Physical Exam Constitutional:      General: He is not in acute distress.    Appearance: He is well-developed.  Musculoskeletal:        General: No deformity.  Neurological:     Mental Status: He is alert and oriented to person, place, and time.     Motor: No tremor.     Coordination: Coordination normal.     Gait: Gait normal.  Psychiatric:        Attention and Perception: He is attentive. He does not perceive auditory hallucinations.        Mood and Affect: Mood is anxious and depressed. Affect is not labile, blunt, angry or inappropriate.        Speech: Speech normal.        Behavior: Behavior normal.        Thought Content: Thought content normal. Thought content does not include homicidal or suicidal ideation. Thought content does not include homicidal or suicidal  plan.        Cognition and Memory: Cognition normal.        Judgment: Judgment normal.     Comments: Insight and judgment fair. No auditory or visual hallucinations. No delusions.  Ruminating stopped.     Lab Review:     Component Value Date/Time   NA 140 11/07/2019 0000   K 4.1 11/07/2019 0000   CL 104 11/07/2019 0000   CO2 29 11/07/2019 0000   GLUCOSE 136 (H) 11/07/2019 0000   GLUCOSE 108 (H) 02/11/2011 0533   BUN 16 11/07/2019 0000   CREATININE 0.88 11/07/2019 0000   CALCIUM 9.4 11/07/2019 0000   PROT 7.0 01/04/2020 0903   ALBUMIN 4.3 01/04/2020 0903   AST 21 01/04/2020 0903   ALT 31 01/04/2020 0903   ALKPHOS 101 01/04/2020 0903   BILITOT 0.5 01/04/2020 0903   GFRNONAA 80 11/07/2019 0000   GFRAA 92 11/07/2019 0000       Component Value Date/Time   WBC 6.0 11/07/2019 0000   WBC 5.6 02/09/2011 0510   RBC 4.36 11/07/2019 0000   RBC 4.34 02/09/2011 0510   HGB 13.4 11/07/2019 0000   HCT 39.8 11/07/2019 0000   PLT 237 11/07/2019 0000   MCV 91 11/07/2019 0000   MCH 30.7 11/07/2019 0000   MCH 31.3 02/09/2011 0510   MCHC 33.7 11/07/2019 0000   MCHC 35.1 02/09/2011 0510   RDW 14.3 11/07/2019 0000   LYMPHSABS 1.2 11/07/2019 0000   MONOABS 0.8 02/05/2011 0340   EOSABS 0.2 11/07/2019 0000   BASOSABS 0.0 11/07/2019 0000    No results found for: POCLITH, LITHIUM   No results found for: PHENYTOIN, PHENOBARB, VALPROATE, CBMZ   .res Assessment: Plan:    Major depressive disorder, recurrent episode, moderate (HCC)  Generalized anxiety disorder  Panic disorder with agoraphobia  Insomnia due to mental condition   Hx multiple recurrences of depression some of which were due to to noncompliance with medication..  Many of the recurrences have been due to noncompliance for various reasons.  He is medication sensitive which complicates things.  Failed multiple meds and types of meds. Tried to educate about diurnal pattern of depression.  He has relapsed again with sx in  the morning  resolved by evening. Depression and anxiety is worse. Claims compliance with sertraline.  Continue mirtazapine 30 HS. He doesn't want to retry lithium 150 which helped before. continue lorazepam 0.5 prn for better tolerability. Otherwise no option to improve without change in sertraline. Consider duloxetine, Trintellix or Viibryd Generic first duloxetine switch to 60 mg daily. Reduce sertraline to 1/2 tablet daily and add duloxetine 30 mg capsule 1 daily for 1 week, Then stop sertraline and increase duloxetine to 2 of the 30 mg capsules. When that prescription runs out the refill will be for duloxetine 60 mg capsule 1 daily  FU 3-4  mos  Lynder Parents, MD, DFAPA   Please see After Visit Summary for patient specific instructions.  Future Appointments  Date Time Provider [place]  08/28/2020  9:45 AM Lorretta Harp, MD [facility]  09/03/2020  9:00 AM [facility] DEVICE REMOTES [facility]  10/04/2020  9:00 AM [facility] DEVICE REMOTES [facility]  11/05/2020  9:00 AM [facility] DEVICE REMOTES [facility]  12/06/2020  9:00 AM [facility] DEVICE REMOTES [facility]  12/31/2020  3:00 PM Allred, Jeneen Rinks, MD [facility]  01/07/2021  9:00 AM [facility] DEVICE REMOTES [facility]  02/07/2021  9:00 AM [facility] DEVICE REMOTES [facility]  03/11/2021  9:00 AM [facility] DEVICE REMOTES [facility]  04/11/2021  9:00 AM [facility] DEVICE REMOTES [facility]  05/13/2021  9:00 AM [facility] DEVICE REMOTES [facility]    No orders of the defined types were placed in this encounter.     -------------------------------

## 2020-08-09 NOTE — Patient Instructions (Signed)
Reduce sertraline to 1/2 tablet daily and add duloxetine 30 mg capsule 1 daily for 1 week, Then stop sertraline and increase duloxetine to 2 of the 30 mg capsules. When that prescription runs out the refill will be for duloxetine 60 mg capsule 1 daily

## 2020-08-10 NOTE — Progress Notes (Signed)
Carelink Summary Report / Loop Recorder 

## 2020-08-19 ENCOUNTER — Other Ambulatory Visit: Payer: Self-pay | Admitting: Psychiatry

## 2020-08-19 DIAGNOSIS — F331 Major depressive disorder, recurrent, moderate: Secondary | ICD-10-CM

## 2020-08-20 ENCOUNTER — Telehealth: Payer: Self-pay | Admitting: Internal Medicine

## 2020-08-20 NOTE — Telephone Encounter (Signed)
  Trace positive ANA And anti pm antidby. Otherwise normal.   Plan  - keep CT scan followup in 6 months    Results for Derrick Compton, Derrick Compton" (MRN [id number]) as of 08/20/2020 07:11  Ref. Range 07/24/2020 09:35 07/24/2020 09:36  Anti Nuclear Antibody (ANA) Latest Ref Range: NEGATIVE  POSITIVE (A)   ANA Pattern 1 Unknown Nuclear, Speckled (A)   ANA Titer 1 Latest Units: titer 1:40 (H)   Angiotensin-Converting Enzyme Latest Ref Range: 9 - 67 U/L 18   Cyclic Citrullin Peptide Ab Latest Units: UNITS <16   GBM Ab Latest Units: AI <1.0   Myeloperoxidase Abs Latest Units: AI <1.0   Serine Protease 3 Latest Units: AI <1.0   RA Latex Turbid. Latest Ref Range: <14 IU/mL <14   ANA,IFA RA DIAG PNL W/RFLX TIT/PATN Unknown Rpt (A)   Anti-Jo-1 Ab (RDL) Latest Ref Range: <20 Units  <20  Anti-PL-7 Ab (RDL) Latest Ref Range: Negative   Negative  Anti-PL-12 Ab (RDL) Latest Ref Range: Negative   Negative  Anti-EJ Ab (RDL) Latest Ref Range: Negative   Negative  Anti-OJ Ab (RDL) Latest Ref Range: Negative   Negative  Anti-SRP Ab (RDL) Latest Ref Range: Negative   Negative  Anti-Mi-2 Ab (RDL) Latest Ref Range: Negative   Negative  Anti-TIF-1gamma Ab (RDL) Latest Ref Range: <20 Units  <20  Anti-MDA-5 Ab (CADM-140)(RDL) Latest Ref Range: <20 Units  <20  Anti-NXP-2 (P140) Ab (RDL) Latest Ref Range: <20 Units  <20  Anti-SAE1 Ab, IgG (RDL) Latest Ref Range: <20 Units  <20  Anti-PM/Scl-100 Ab (RDL) Latest Ref Range: <20 Units  27 (H)  Anti-Ku Ab (RDL) Latest Ref Range: Negative   Negative  Anti-SS-A 52kD Ab, IgG (RDL) Latest Ref Range: <20 Units  <20  Anti-U1 RNP Ab (RDL) Latest Ref Range: <20 Units  <20  Anti-U2 RNP Ab (RDL) Latest Ref Range: Negative   Negative  Anti-U3 RNP (Fibrillarin)(RDL) Latest Ref Range: Negative   Negative  QUANTIFERON-TB GOLD PLUS Unknown Rpt   Mitogen-NIL Latest Units: IU/mL 9.58

## 2020-08-23 NOTE — Telephone Encounter (Signed)
Called and spoke with pt letting him know the results of labwork and he verbalized understanding. nothing further needed.

## 2020-08-28 ENCOUNTER — Ambulatory Visit: Payer: Medicare Other | Admitting: Cardiovascular Disease

## 2020-09-02 ENCOUNTER — Other Ambulatory Visit: Payer: Self-pay | Admitting: Psychiatry

## 2020-09-02 DIAGNOSIS — F411 Generalized anxiety disorder: Secondary | ICD-10-CM

## 2020-09-02 DIAGNOSIS — F331 Major depressive disorder, recurrent, moderate: Secondary | ICD-10-CM

## 2020-09-02 DIAGNOSIS — F4001 Agoraphobia with panic disorder: Secondary | ICD-10-CM

## 2020-09-03 ENCOUNTER — Ambulatory Visit: Payer: Medicare Other

## 2020-09-04 ENCOUNTER — Other Ambulatory Visit: Payer: Self-pay

## 2020-09-04 NOTE — Telephone Encounter (Signed)
Derrick Compton, verify his dose and request

## 2020-09-10 LAB — CUP PACEART REMOTE DEVICE CHECK
Date Time Interrogation Session: 20220418063645
Implantable Pulse Generator Implant Date: 20211103

## 2020-09-19 NOTE — Progress Notes (Signed)
Cardiology Clinic Note   Patient Name: Derrick [facility] Date of Encounter: 09/25/2020  Primary Care Provider:  Alonna Buckler, MD Primary Cardiologist:  Derrick Burow, MD  Patient Profile    Derrick Crane Guest86 year old male presents today for a follow-up evaluation of his coronary artery disease and shortness of breath.  Past Medical History    Past Medical History:  Diagnosis Date  . Anxiety   . BPH (benign prostatic hyperplasia)   . CAD (coronary artery disease)   . Chest pain   . Hyperlipidemia   . Osteopenia   . Paroxysmal atrial fibrillation [facility])    Past Surgical History:  Procedure Laterality Date  . ATRIAL FIBRILLATION ABLATION N/A 11/10/2019   Procedure: ATRIAL FIBRILLATION ABLATION;  Surgeon: Derrick Grayer, MD;  Location: [facility];  Service: Cardiovascular;  Laterality: N/A;  . implantable loop recorder placement  03/28/2020    Medtr[employer] Reveal Linq model LNQ 22 (SN [id number] ) implantable loop recorder implanted for afib management  . LEFT HEART CATH AND CORONARY ANGIOGRAPHY N/A 02/05/2017   Procedure: LEFT HEART CATH AND CORONARY ANGIOGRAPHY;  Surgeon: Derrick Harp, MD;  Location: [facility];  Service: Cardiovascular;  Laterality: N/A;    Allergies  No Known Allergies  History of Present Illness    Derrick Compton a past medical history of coronary artery disease with cardiac catheterization on 02/05/2017 that showed 90% ostial first diagonal branch stenosis coming off orthogonal to the LAD making PCI high risk. His cardiac catheterization showed no other significant CAD andmedical management was chosen as the best course of therapy. He had a stress test on 10/2016 which was negative however, he did have a positive CT FFR of a diagonal branch 12/2016 due to his hypotension and bradycardia he was not a good candidate for anti-anginal medications. An echocardiogram on 5/29 /2020 showed an EF of 55 to 60% and mild stable aortic valve stenosis  with a peak gradient of 15 mmHg. He also has hyperlipidemia which is managed by his PCP.  He had pneumonia symptoms January 2020, was given antibiotics and his follow-up film indicated pulmonary infiltrates in the right upper lobe. Seen by [facility] on 03/14/2019. He reviewed a chest CT from 02/23/2019 that was taken at the [facility] clinic in [place] which showed small infiltrates. He plans to repeat CT chest without contrast at the end of December 2020.  He was last seen by Derrick [facility] on 09/22/2018. During that time he was doing well. He denied chest pain and continued to walk 5 to 6 miles daily.  He presented to the clinic on 04/06/2019 and stated he hadchest discomfort when he  ran or ran upstairs. He continued to walk 5 to 6 miles per day with no chest discomfort. He stated that over the last 6 months he had noticed with more depression. He had been working with a psychiatrist that is prescribing sertraline.  I  started Ranexa 500 mg twice daily.  He presented to the clinic 04/20/2019 and stated he had continued to use his elliptical for 30 to 45 minutes at a time and walk up the stairs at the [facility] building.  He stated that he continued to have exertional chest pain.  He stated that  while he was on the elliptical he had chest discomfort for about 5 minutes that was mild in nature.  He continued to exercise and chest discomfort dissipated.  He went on to state that as soon as he starts  to increase his pace or jog for example his chest discomfort reappears.  He stated that since he was at the last visit his breathing had been better.  He presented to his primary for a follow-up chest x-ray which showed his pneumonia had cleared.  He went on to state that he had started to use the mindfulness meditation information/handout that was given to him during the last visit and his anxiety has lessened.  I  increased his Ranexa to 1000 mg twice daily and scheduled him for a follow-up in 1  month.   He wore a cardiac event monitor after noticing increased heartbeats 05/05/2019.  His cardiac event monitor showed 2% AF burden and 45 episodes of SVT.  He was seen by [facility] on 06/15/2019.  His event monitor was reviewed at that time and he was referred to EP to further discuss anticoagulation/treatment recommendations.  He was seen by [facility] on 06/20/2019.  During that time his paroxysmal atrial fibrillation was discussed.  His CHA2DS2-VASc score was 3.  His aspirin was stopped and he was started on apixaban.  He followed up with [facility] on 07/04/2019.  He was noted to be sinus rhythm with PACs at that time.  He was prescribed Cardizem 30 mg as needed for breakthrough atrial fibrillation.  He was advised to avoid triggers for atrial fibrillation.  He was seen by [facility]rick [facility] on 08/17/2019.  During that time he reported increased dyspnea over the last 6 months and exertional chest pain.  His echocardiogram was reviewed and a repeat coronary CTA showed a coronary calcium score of 217, normal right coronary dominance, and mild 25-49% ostial D1.  He was seen by Derrick [facility] on 2/22.  He reported he was doing well post ablation.  His ILR was reviewed and showed no A. fib.  He reported compliance with his Eliquis.  His follow-up chest CT 2/22 showed new right upper lobe multifactoral peribronchial vascular groundglass felt to be due to interval infection or inflammation.  Follow-up CT was scheduled for 6 months.  He follows with [facility]rick [facility].  He presents the clinic today for follow-up evaluation states he feels well.  He continues to stay very physically active working out 4-5 times per week or more.  He does note that when he increases his physical activity he has anterior substernal chest discomfort.  He notices today pain relieves/dissipates with exercise cessation.  He reports that he feels this may be related in some part due to anxiety.  We reviewed his coronary CTA and  cholesterol  panel.  We also discussed his lung inflammation.  He has not required any diltiazem for increased heart rate or irregular heartbeats.  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  I will give him the salty 6 diet sheet, have him maintain his physical activity, and follow-up with Derrick [facility] in 6 months.  Today he denies chest pain at rest, shortness of breath, lower extremity edema, fatigue, palpitations, melena, hematuria, hemoptysis, diaphoresis, weakness, presyncope, syncope, orthopnea, and PND.  Home Medications    Prior to Admission medications   Medication Sig Start Date End Date Taking? Authorizing Provider  diltiazem (CARDIZEM) 30 MG tablet TAKE 1 TABLET EVERY 4 HOURS AS NEEDED FOR AFIB HEART RATE >100 03/19/20   Allred, Jeneen Rinks, MD  DULoxetine (CYMBALTA) 60 MG capsule TAKE 1 CAPSULE (60 MG) DAILY 09/04/20   Cottle, Billey Co., MD  ELIQUIS 5 MG TABS tablet  TAKE 1 TABLET BY MOUTH TWICE A DAY 06/11/20   Allred, Jeneen Rinks, MD  Evolocumab (REPATHA SURECLICK) [id number] MG/ML SOAJ Inject 1 pen into the skin every 14 (fourteen) days. 01/12/20   Derrick Harp, MD  LORazepam (ATIVAN) 0.5 MG tablet Take 1 tablet (0.5 mg total) by mouth every 8 (eight) hours. 11/22/19   Cottle, Billey Co., MD  mirtazapine (REMERON) 30 MG tablet TAKE 1 TABLET BY MOUTH EVERYDAY AT BEDTIME 06/04/20   Cottle, Billey Co., MD  Protein POWD Take 1 Scoop by mouth See admin instructions. Take monday- Friday vega essentials shake    [provider]  rosuvastatin (CRESTOR) 40 MG tablet Take 1 tablet (40 mg total) by mouth daily. 06/14/20   Derrick Harp, MD    Family History    History reviewed. No pertinent family history. He indicated that his mother is deceased. He indicated that his father is deceased.  Social History    Social History   Socioeconomic History  . Marital status: Married    Spouse name: Not on file  . Number of children: Not on file  . Years of  education: Not on file  . Highest education level: Not on file  Occupational History  . Not on file  Tobacco Use  . Smoking status: Never Smoker  . Smokeless tobacco: Never Used  Substance and Sexual Activity  . Alcohol use: Yes    Alcohol/week: 2.0 standard drinks    Types: 2 Standard drinks or equivalent per week  . Drug use: No  . Sexual activity: Not on file  Other Topics Concern  . Not on file  Social History Narrative   Lives in [place] with wife.      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   Social Determinants of Health   Financial Resource Strain: Not on file  Food Insecurity: Not on file  Transportation Needs: Not on file  Physical Activity: Not on file  Stress: Not on file  Social Connections: Not on file  Intimate Partner Violence: Not on file     Review of Systems    General:  No chills, fever, night sweats or weight changes.  Cardiovascular:  No chest pain, dyspnea on exertion, edema, orthopnea, palpitations, paroxysmal nocturnal dyspnea. Dermatological: No rash, lesions/masses Respiratory: No cough, dyspnea Urologic: No hematuria, dysuria Abdominal:   No nausea, vomiting, diarrhea, bright red blood per rectum, melena, or hematemesis Neurologic:  No visual changes, wkns, changes in mental status. All other systems reviewed and are otherwise negative except as noted above.  Physical Exam    VS:  BP 108/60   Pulse 71   Ht 5\' 10"  (1.778 m)   Wt 155 lb 9.6 oz (70.6 kg)   BMI 22.33 kg/m  , BMI Body mass index is 22.33 kg/m. GEN: Well nourished, well developed, in no acute distress. HEENT: normal. Neck: Supple, no JVD, carotid bruits, or masses. Cardiac: RRR, no murmurs, rubs, or gallops. No clubbing, cyanosis, edema.  Radials/DP/PT 2+ and equal bilaterally.  Respiratory:  Respirations regular and unlabored, clear to auscultation bilaterally. GI: Soft, nontender, nondistended, BS + x 4. MS: no deformity or atrophy. Skin:  warm and dry, no rash. Neuro:  Strength and sensation are intact. Psych: Normal affect.  Accessory Clinical Findings    Recent Labs: 11/07/2019: BUN 16; Creatinine, Ser 0.88; Hemoglobin 13.4; Platelets 237; Potassium 4.1; Sodium 140 01/04/2020: ALT 31   Recent Lipid Panel    Component Value Date/Time  CHOL 101 03/30/2020 0901   TRIG 74 03/30/2020 0901   HDL 50 03/30/2020 0901   CHOLHDL 2.0 03/30/2020 0901   CHOLHDL 3.1 02/04/2011 1509   VLDL 20 02/04/2011 1509   LDLCALC 36 03/30/2020 0901    ECG personally reviewed by me today-sinus rhythm with blocked premature atrial complexes, incomplete right bundle branch block left anterior fascicular block anterior septal infarct undetermined age 45 bpm- No acute changes  Coronary CTA 09/12/2019 IMPRESSION: 1. Coronary calcium score of 217. This was 14 percentile for age and sex matched control.  2. Normal coronary origin with right dominance.  3. Mild CAD with 25-49% stenosis in the ostial D1; mimimal disease elsewhere; CADRADS-2.  Kirk Ruths   Electronically Signed   By: Kirk Ruths M.D.   On: 09/12/2019 17:29  Linq report 09/10/2020 No arrhythmias normal histograms  Echocardiogram 07/18/2020 IMPRESSIONS    1. Frequent ectopy on study. Left ventricular ejection fraction, by  estimation, is 50 to 55%. The left ventricle has low normal function. The  left ventricle has no regional wall motion abnormalities. There is mild  left ventricular hypertrophy of the  basal-septal segment. Left ventricular diastolic parameters are consistent  with Grade I diastolic dysfunction (impaired relaxation).  2. Right ventricular systolic function is normal. The right ventricular  size is normal.  3. The mitral valve is grossly normal. Trivial mitral valve  regurgitation.  4. The aortic valve is tricuspid. There is moderate calcification of the  aortic valve. There is moderate thickening of the aortic valve. Aortic  valve  regurgitation is trivial. Mild aortic valve stenosis.  5. Aortic dilatation noted. There is mild dilatation of the ascending  aorta, measuring 39 mm.  6. The inferior vena cava is normal in size with greater than 50%  respiratory variability, suggesting right atrial pressure of 3 mmHg.   Comparison(s): Compared to prior study 07/19/19, the LVEF appears slightly  reduced at 50-55% in the setting of very frequent ectopy. Otherwise, no  significant change.   Assessment & Plan   1.  Coronary artery disease- denies chest pain today.  Continues to have intermittent episodes of chest discomfort with increased physical activity.  Did not have any improvement with trial of Ranexa.  Continues to be very physically active walking most days of the week.  Coronary CTA 09/12/2019 showed stable coronary disease. Continue rosuvastatin, Repatha Heart healthy diet Maintain physical activity  Paroxysmal atrial fibrillation- EKG today shows sinus rhythm with PACs incomplete right bundle branch block 71 bpm.  Most recent device interrogation showed no arrhythmias and normal histogram 1422 Continue apixaban, diltiazem as needed Heart healthy diet Maintain physical activity  Aortic stenosis- most recent echocardiogram 07/18/2020 showed mild aortic valve stenosis, mild dilation of the ascending aorta measuring 39 mm.  G1 DD was also seen. Repeat echocardiogram when clinically indicated.  Hyperlipidemia-03/30/2020: Cholesterol, Total 101; HDL 50; LDL Chol Calc (NIH) 36; Triglycerides 74 Continue rosuvastatin, Repatha Heart healthy high-fiber diet Maintain physical activity Follows with PCP  Shortness of breath- no increased DOE or activity intolerance.  Most recent chest CT showed new right upper lobe multifactoral peribronchial vascular groundglass felt to be due to interval infection or inflammation.  Follow-up CT planned for 6 months.   Follows with pulmonology  Disposition: Follow-up with [facility] in 6  months.   Jossie Ng. Krishawn Vanderweele NP-C    09/25/2020, 9:03 AM [place] [address] Office [phone] Fax [phone]  Notice: This dictation was prepared with  Dragon dictation along with smaller Company secretary. Any transcriptional errors that result from this process are unintentional and may not be corrected upon review.  I spent 15 minutes examining this patient, reviewing medications, and using patient centered shared decision making involving her cardiac care.  Prior to her visit I spent greater than 20 minutes reviewing her past medical history,  medications, and prior cardiac tests.

## 2020-09-25 ENCOUNTER — Encounter: Payer: Self-pay | Admitting: General Practice

## 2020-09-25 ENCOUNTER — Ambulatory Visit (INDEPENDENT_AMBULATORY_CARE_PROVIDER_SITE_OTHER): Payer: Medicare Other | Admitting: General Practice

## 2020-09-25 ENCOUNTER — Other Ambulatory Visit: Payer: Self-pay

## 2020-09-25 VITALS — BP 108/60 | HR 71 | Ht 70.0 in | Wt 155.6 lb

## 2020-09-25 DIAGNOSIS — I2510 Atherosclerotic heart disease of native coronary artery without angina pectoris: Secondary | ICD-10-CM

## 2020-09-25 DIAGNOSIS — R0602 Shortness of breath: Secondary | ICD-10-CM

## 2020-09-25 DIAGNOSIS — I350 Nonrheumatic aortic (valve) stenosis: Secondary | ICD-10-CM | POA: Diagnosis not present

## 2020-09-25 DIAGNOSIS — E7800 Pure hypercholesterolemia, unspecified: Secondary | ICD-10-CM

## 2020-09-25 DIAGNOSIS — E782 Mixed hyperlipidemia: Secondary | ICD-10-CM | POA: Diagnosis not present

## 2020-09-25 DIAGNOSIS — I480 Paroxysmal atrial fibrillation: Secondary | ICD-10-CM | POA: Diagnosis not present

## 2020-09-25 NOTE — Patient Instructions (Signed)
Medication Instructions:  The current medical regimen is effective;  continue present plan and medications as directed. Please refer to the Current Medication list given to you today., *If you need a refill on your cardiac medications before your next appointment, please call your pharmacy*  Lab Work:   Testing/Procedures:  NONE    NONE  Special Instructions PLEASE READ AND FOLLOW SALTY 6-ATTACHED-1,800mg  daily  PLEASE MAINTAIN PHYSICAL ACTIVITY AS TOLERATED  Follow-Up: Your next appointment:  6 month(s) In Person with Quay Burow, MD OR IF UNAVAILABLE JESSE CLEAVER, FNP-C   Please call our office 2 months in advance to schedule this appointment   At [facility], you and your health needs are our priority.  As part of our continuing mission to provide you with exceptional heart care, we have created designated Provider Care Teams.  These Care Teams include your primary Cardiologist (physician) and Advanced Practice Providers (APPs -  Physician Assistants and Nurse Practitioners) who all work together to provide you with the care you need, when you need it.  We recommend signing up for the patient portal called "MyChart".  Sign up information is provided on this After Visit Summary.  MyChart is used to connect with patients for Virtual Visits (Telemedicine).  Patients are able to view lab/test results, encounter notes, upcoming appointments, etc.  Non-urgent messages can be sent to your provider as well.   To learn more about what you can do with MyChart, go to [email or web address].             6 SALTY THINGS TO AVOID     1,800MG  DAILY

## 2020-10-02 ENCOUNTER — Other Ambulatory Visit: Payer: Self-pay

## 2020-10-02 ENCOUNTER — Encounter: Payer: Self-pay | Admitting: Psychiatry

## 2020-10-02 ENCOUNTER — Ambulatory Visit (INDEPENDENT_AMBULATORY_CARE_PROVIDER_SITE_OTHER): Payer: Medicare Other | Admitting: Psychiatry

## 2020-10-02 DIAGNOSIS — F331 Major depressive disorder, recurrent, moderate: Secondary | ICD-10-CM | POA: Diagnosis not present

## 2020-10-02 DIAGNOSIS — I2510 Atherosclerotic heart disease of native coronary artery without angina pectoris: Secondary | ICD-10-CM | POA: Diagnosis not present

## 2020-10-02 DIAGNOSIS — F411 Generalized anxiety disorder: Secondary | ICD-10-CM

## 2020-10-02 DIAGNOSIS — F4001 Agoraphobia with panic disorder: Secondary | ICD-10-CM

## 2020-10-02 DIAGNOSIS — F5105 Insomnia due to other mental disorder: Secondary | ICD-10-CM | POA: Diagnosis not present

## 2020-10-02 MED ORDER — DULOXETINE HCL 60 MG PO CPEP: ORAL_CAPSULE | ORAL | 1 refills | Status: DC

## 2020-10-02 MED ORDER — LORAZEPAM 0.5 MG PO TABS: 0.5000 mg | ORAL_TABLET | Freq: Three times a day (TID) | ORAL | 1 refills | Status: DC

## 2020-10-02 MED ORDER — MIRTAZAPINE 30 MG PO TABS: 30.0000 mg | ORAL_TABLET | Freq: Every day | ORAL | 1 refills | Status: DC

## 2020-10-02 NOTE — Progress Notes (Signed)
[name] [id number] [birth year] 86 y.o.  Subjective:   Patient ID:  Derrick Compton is a 86 y.o. (DOB [birth year]) male.  Chief Complaint:  Chief Complaint  Patient presents with  . Follow-up  . Major depressive disorder, recurrent episode, moderate (HCC)    Depression        Associated symptoms include no decreased concentration and no suicidal ideas.  Derrick Compton presents to the office today for follow-up of TRD  And panic and GAD.   At  visit in December he was depressed.  Started mirtazapine and very low dose lithium.  Awakens with a little depression.  Sleep is pretty good and rarely needs bz.  Depression better after a week or so.  Tolerating meds ok.  Not gaining weight like when on mirtazapine before.   When seen July 28, 2018.  No meds were changed. However at some point he discontinued lithium 150 mg daily.  He stopped bc didn't seem to be doing anything and read about all the side effects.  When seen October 2020 the following observations were made: He has relapsed again and the only change is he stopped lithium.  He doesn't think it helped but I question that. He prefers to increase sertraline over restarting lithium.  Tried to address his fears about lithium. Option increase mirtazapine but may lose sleep benefit. Increase sertraline to 50 mg tabs 3 daily for total of 150 mg daily.  December 2020 appointment the following is noted: More  Groggy with increase sertraline and reduced it back to the original 50 mg daily for about 6 weeks. Went into Mind Stress Management and meditation and it seems to help a good bit.  Thinks a lot of his depression is anxiety over his health fears and the meditation helps to deal with it.  covid didn't help his mood.  Worries over his heart bc he has some issues with it. Can enjoy watching a ball game and function is better.  Doesn't have to stay so busy to be OK now.  Recognizes he needs to change the way he thinks to live with  more peace. Rare alprazolam about 2 times monthly.  Business doing well. Notices calming effect from sertraline.  Fewer speeding tickets. Patient reports stable mood and denies depressed or irritable moods.  Less difficulty with anxiety.  Patient denies difficulty with sleep initiation or maintenance. NO appetite disturbance.  Patient reports that energy and motivation have been good.  Patient denies any difficulty with concentration.  Patient denies any suicidal ideation.  Plan:Continue sertraline 50 Continue mirtazapine 30 HS.  11/22/2019 appointment the following is noted: Not so well.  Afib ablation last Thursday and dosen't feel better. Anxiety is worse esp AM.  Like 2 different people.  Later in day is better. AM don't want to do anything and scared.  Normal by lunch usually. Not taking BZ.  Tried it and gets wiped out. If takes both alprazolam and mirtazapine at night then gets agitated sleep. Sleep good and no panic. Taking mirtazapine 30 and sertraline 100 daily. He doesn't like taking multiple meds. Plan: Do not change AMA. Continue sertraline 100 Continue mirtazapine 30 HS. Restart lithium 150 which helped before. Change alprazolam 0.5 to lorazepam 0.5 prn for better tolerability.  08/09/2020 appointment with the following noted: Didn't take lithium long bc it didn't work. Last 3 mos doesn't want to get OOB and is anxious but once up and working feels a lot better.  Little things cause more  anxiety.  The other 6 mos was pretty good.  Not unhappy but doesn't get excited about anything. No problems with sleep and looks forward to going to bed. Only drinks when goes out and at most 2 and when does the whole world feels great. Work and home life good.  Some worry age related.  No panic.  No SI Plan: He has relapsed again with sx in the morning resolved by evening. Depression and anxiety is worse. Claims compliance with sertraline. Continue mirtazapine 30 HS. He doesn't want to  retry lithium 150 which helped before. continue lorazepam 0.5 prn for better tolerability. Otherwise no option to improve without change in sertraline. Consider duloxetine, Trintellix or Viibryd Generic first duloxetine switch to 60 mg daily. Reduce sertraline to 1/2 tablet daily and add duloxetine 30 mg capsule 1 daily for 1 week, Then stop sertraline and increase duloxetine to 2 of the 30 mg capsules. When that prescription runs out the refill will be for duloxetine 60 mg capsule 1 daily  10/02/2020 appointment with the following noted: Excellent response to duloxetine with resolution of depression, better energy and enjoyment. Better energy and no panic. Patient reports stable mood and denies depressed or irritable moods.  Patient denies any recent difficulty with anxiety.  Patient denies difficulty with sleep initiation or maintenance. Denies appetite disturbance.  Patient reports that energy and motivation have been good.  Patient denies any difficulty with concentration.  Patient denies any suicidal ideation.  No SE.  Past psychiatric medications Wellbutrin 150, citalopram, paroxetine, Deplin,  fluoxetine side effects, sertraline 150  Groggy Mirtazapine 45 drowsy buspirone, Abilify 2 mg, Rexulti 2 mg, lithium 150 Lorazepam, alprazolam 0.5  Review of Systems:  Review of Systems  Constitutional: Negative for activity change and unexpected weight change.  Neurological: Negative for tremors and weakness.  Psychiatric/Behavioral: Positive for depression. Negative for agitation, behavioral problems, confusion, decreased concentration, dysphoric mood, hallucinations, self-injury, sleep disturbance and suicidal ideas. The patient is not nervous/anxious and is not hyperactive.     Medications: I have reviewed the patient's current medications.  Current Outpatient Medications  Medication Sig Dispense Refill  . ELIQUIS 5 MG TABS tablet TAKE 1 TABLET BY MOUTH TWICE A DAY 60 tablet 11  .  Evolocumab (REPATHA SURECLICK) 741 MG/ML SOAJ Inject 1 pen into the skin every 14 (fourteen) days. 2 mL 11  . Protein POWD Take 1 Scoop by mouth See admin instructions. Take monday- Friday vega essentials shake    . rosuvastatin (CRESTOR) 40 MG tablet Take 1 tablet (40 mg total) by mouth daily. 30 tablet 6  . DULoxetine (CYMBALTA) 60 MG capsule TAKE 1 CAPSULE (60 MG) DAILY 90 capsule 1  . LORazepam (ATIVAN) 0.5 MG tablet Take 1 tablet (0.5 mg total) by mouth every 8 (eight) hours. 30 tablet 1  . mirtazapine (REMERON) 30 MG tablet Take 1 tablet (30 mg total) by mouth at bedtime. 90 tablet 1   No current facility-administered medications for this visit.    Medication Side Effects: None  Allergies: No Known Allergies  Past Medical History:  Diagnosis Date  . Anxiety   . BPH (benign prostatic hyperplasia)   . CAD (coronary artery disease)   . Chest pain   . Hyperlipidemia   . Osteopenia   . Paroxysmal atrial fibrillation ([place])     History reviewed. No pertinent family history.  Social History   Socioeconomic History  . Marital status: Married    Spouse name: Not on file  . Number of children:  Not on file  . Years of education: Not on file  . Highest education level: Not on file  Occupational History  . Not on file  Tobacco Use  . Smoking status: Never Smoker  . Smokeless tobacco: Never Used  Substance and Sexual Activity  . Alcohol use: Yes    Alcohol/week: 2.0 standard drinks    Types: 2 Standard drinks or equivalent per week  . Drug use: No  . Sexual activity: Not on file  Other Topics Concern  . Not on file  Social History Narrative   Lives in [place] with wife.      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   Social Determinants of Health   Financial Resource Strain: Not on file  Food Insecurity: Not on file  Transportation Needs: Not on file  Physical Activity: Not on file  Stress: Not on file  Social Connections: Not on file   Intimate Partner Violence: Not on file    Past Medical History, Surgical history, Social history, and Family history were reviewed and updated as appropriate.   Please see review of systems for further details on the patient's review from today.   Objective:   Physical Exam:  There were no vitals taken for this visit.  Physical Exam Constitutional:      General: He is not in acute distress.    Appearance: He is well-developed.  Musculoskeletal:        General: No deformity.  Neurological:     Mental Status: He is alert and oriented to person, place, and time.     Motor: No tremor.     Coordination: Coordination normal.     Gait: Gait normal.  Psychiatric:        Attention and Perception: He is attentive. He does not perceive auditory hallucinations.        Mood and Affect: Mood is not anxious or depressed. Affect is not labile, blunt, angry or inappropriate.        Speech: Speech normal.        Behavior: Behavior normal.        Thought Content: Thought content normal. Thought content does not include homicidal or suicidal ideation. Thought content does not include homicidal or suicidal plan.        Cognition and Memory: Cognition normal.        Judgment: Judgment normal.     Comments: Insight and judgment fair. No auditory or visual hallucinations. No delusions.       Lab Review:     Component Value Date/Time   NA 140 11/07/2019 0000   K 4.1 11/07/2019 0000   CL 104 11/07/2019 0000   CO2 29 11/07/2019 0000   GLUCOSE 136 (H) 11/07/2019 0000   GLUCOSE 108 (H) 02/11/2011 0533   BUN 16 11/07/2019 0000   CREATININE 0.88 11/07/2019 0000   CALCIUM 9.4 11/07/2019 0000   PROT 7.0 01/04/2020 0903   ALBUMIN 4.3 01/04/2020 0903   AST 21 01/04/2020 0903   ALT 31 01/04/2020 0903   ALKPHOS 101 01/04/2020 0903   BILITOT 0.5 01/04/2020 0903   GFRNONAA 80 11/07/2019 0000   GFRAA 92 11/07/2019 0000       Component Value Date/Time   WBC 6.0 11/07/2019 0000   WBC 5.6  02/09/2011 0510   RBC 4.36 11/07/2019 0000   RBC 4.34 02/09/2011 0510   HGB 13.4 11/07/2019 0000   HCT 39.8 11/07/2019 0000   PLT 237 11/07/2019 0000   MCV  91 11/07/2019 0000   MCH 30.7 11/07/2019 0000   MCH 31.3 02/09/2011 0510   MCHC 33.7 11/07/2019 0000   MCHC 35.1 02/09/2011 0510   RDW 14.3 11/07/2019 0000   LYMPHSABS 1.2 11/07/2019 0000   MONOABS 0.8 02/05/2011 0340   EOSABS 0.2 11/07/2019 0000   BASOSABS 0.0 11/07/2019 0000    No results found for: POCLITH, LITHIUM   No results found for: PHENYTOIN, PHENOBARB, VALPROATE, CBMZ   .res Assessment: Plan:    Major depressive disorder, recurrent episode, moderate (HCC) - Plan: DULoxetine (CYMBALTA) 60 MG capsule, mirtazapine (REMERON) 30 MG tablet  Generalized anxiety disorder - Plan: DULoxetine (CYMBALTA) 60 MG capsule, LORazepam (ATIVAN) 0.5 MG tablet  Panic disorder with agoraphobia - Plan: DULoxetine (CYMBALTA) 60 MG capsule, LORazepam (ATIVAN) 0.5 MG tablet  Insomnia due to mental condition - Plan: mirtazapine (REMERON) 30 MG tablet   Hx multiple recurrences of depression some of which were due to to noncompliance with medication..  Many of the recurrences have been due to noncompliance for various reasons.  He is medication sensitive which complicates things.  Failed multiple meds and types of meds. Tried to educate about diurnal pattern of depression.  He has relapsed again with sx in the morning resolved by evening. Depression and anxiety is worse. Claims compliance with sertraline.  Continue mirtazapine 30 HS.  duloxetine 60 mg capsule 1 daily  FU 6  mos  Lynder Parents, MD, DFAPA   Please see After Visit Summary for patient specific instructions.  Future Appointments  Date Time Provider Ali Chuk  10/04/2020  9:00 AM [facility] DEVICE REMOTES [facility]  11/05/2020  9:00 AM [facility] DEVICE REMOTES [facility]  12/06/2020  9:00 AM [facility] DEVICE REMOTES [facility]  12/31/2020  3:00 PM Allred, Jeneen Rinks, MD [facility]  01/07/2021  9:00 AM [facility] DEVICE REMOTES [facility]  02/07/2021  9:00 AM [facility] DEVICE REMOTES [facility]  03/11/2021  9:00 AM [facility] DEVICE REMOTES [facility]  04/11/2021  9:00 AM [facility] DEVICE REMOTES [facility]  05/13/2021  9:00 AM [facility]ICE [facility]ES [facility]    No orders of the defined types were placed in this encounter.     -------------------------------

## 2020-10-16 ENCOUNTER — Ambulatory Visit (INDEPENDENT_AMBULATORY_CARE_PROVIDER_SITE_OTHER): Payer: Medicare Other

## 2020-10-16 DIAGNOSIS — R3129 Other microscopic hematuria: Secondary | ICD-10-CM | POA: Insufficient documentation

## 2020-10-16 DIAGNOSIS — R000 Tachycardia, unspecified: Secondary | ICD-10-CM

## 2020-10-16 LAB — CUP PACEART REMOTE DEVICE CHECK
Date Time Interrogation Session: 20220521063840
Implantable Pulse Generator Implant Date: 20211103

## 2020-11-07 NOTE — Progress Notes (Signed)
Carelink Summary Report / Loop Recorder 

## 2020-11-12 DIAGNOSIS — Z0000 Encounter for general adult medical examination without abnormal findings: Secondary | ICD-10-CM | POA: Insufficient documentation

## 2020-11-13 ENCOUNTER — Telehealth: Payer: Self-pay | Admitting: *Deleted

## 2020-11-13 NOTE — Telephone Encounter (Signed)
   [facility] Pre-operative Risk Assessment    Patient Name: Derrick Compton  DOB: 10-22-1934  MRN: [id number]     Request for surgical clearance:  What type of surgery is being performed? One Implant removal    When is this surgery scheduled? TBD   What type of clearance is required (medical clearance vs. Pharmacy clearance to hold med vs. Both)? Both   Are there any medications that need to be held prior to surgery and how long?Eliquis 5 mg   Practice name and name of physician performing surgery?  [facility] ;Dr Chuck Hint   What is the office phone number? [phone]    7.   What is the office fax number? [phone]  8.   Anesthesia type (None, local, MAC, general) ?    Local aesthetic (with epinephrine ) - IV SEDATION -- may include ( Triazolam ,Lorazepam, Hydroxyzine Versed, Fentanyl ,Dexamethasone, Toradol , demerol, Zofran,Ketamine)   Derrick Compton 11/13/2020, 2:46 PM  _________________________________________________________________   (provider comments below)

## 2020-11-13 NOTE — Telephone Encounter (Signed)
Pharmacy, can you please comment on how long Eliquis can be held prior to upcoming procedure?  Thank you!

## 2020-11-14 NOTE — Telephone Encounter (Signed)
Patient with diagnosis of afib on Eliquis for anticoagulation.    Procedure: 1 dental implant removal Date of procedure: TBD  CHA2DS2-VASc Score = 3  This indicates a 3.2% annual risk of stroke. The patient's score is based upon: CHF History: No HTN History: No Diabetes History: No Stroke History: No Vascular Disease History: Yes  CrCl 61 Platelet count 213  Pt does not require pre op antibiotics  Per office protocol, patient can hold Eliquis for 1 day prior to procedure if needed.

## 2020-11-15 ENCOUNTER — Encounter: Payer: Self-pay | Admitting: Sports Medicine

## 2020-11-15 ENCOUNTER — Ambulatory Visit (INDEPENDENT_AMBULATORY_CARE_PROVIDER_SITE_OTHER): Payer: Medicare Other | Admitting: Sports Medicine

## 2020-11-15 ENCOUNTER — Other Ambulatory Visit: Payer: Self-pay

## 2020-11-15 DIAGNOSIS — M79675 Pain in left toe(s): Secondary | ICD-10-CM

## 2020-11-15 DIAGNOSIS — Q828 Other specified congenital malformations of skin: Secondary | ICD-10-CM | POA: Diagnosis not present

## 2020-11-15 DIAGNOSIS — L853 Xerosis cutis: Secondary | ICD-10-CM

## 2020-11-15 DIAGNOSIS — B351 Tinea unguium: Secondary | ICD-10-CM

## 2020-11-15 DIAGNOSIS — I2510 Atherosclerotic heart disease of native coronary artery without angina pectoris: Secondary | ICD-10-CM | POA: Diagnosis not present

## 2020-11-15 DIAGNOSIS — M79674 Pain in right toe(s): Secondary | ICD-10-CM

## 2020-11-15 LAB — CUP PACEART REMOTE DEVICE CHECK
Date Time Interrogation Session: 20220623063526
Implantable Pulse Generator Implant Date: 20211103

## 2020-11-15 NOTE — Progress Notes (Signed)
Subjective: Derrick Compton is a 86 y.o. male patient seen today in office for discussion of nail fungus. Reports that he has thought about the options and wants to discuss. Patient also reports that he has dry skin on the heels and some soreness on the right. Patient has no other pedal complaints at this time.   Patient Active Problem List   Diagnosis Date Noted   Encounter for initial annual wellness visit (AWV) in Medicare patient 11/12/2020   Microscopic hematuria 10/16/2020   Shortness of breath 04/13/2020   Lung nodules 04/05/2020   Bladder calculi 01/02/2020   Paroxysmal atrial fibrillation ([place]) 08/17/2019   Noise-induced hearing loss 02/08/2019   Pneumonia of right lung due to infectious organism 08/17/2018   Unspecified osteoarthritis, unspecified site 06/13/2018   Recurrent major depressive episodes, mild ([place]) 06/13/2018   Bradycardia, sinus 11/13/2017   Coronary artery disease 09/22/2017   Aortic stenosis, mild 01/16/2017   Chest pain 10/28/2016   Colon cancer ([place]) 10/22/2016   Hyperlipidemia 10/22/2016   Sensorineural hearing loss (SNHL), bilateral 10/22/2016   Anxiety 04/23/2016   BPH (benign prostatic hyperplasia) 04/23/2016   Hearing difficulty of right ear 04/23/2016   Uses hearing aid 04/23/2016   Benign non-nodular prostatic hyperplasia with lower urinary tract symptoms 11/28/2014   Kidney stone 11/28/2014   H/O hematuria 11/28/2014   Recurrent nephrolithiasis 11/28/2014   Status post corneal transplant 12/08/2011   Fuchs' corneal dystrophy 05/07/2011   Status post LASIK surgery 05/07/2011   S/P ablation of atrial fibrillation 05/07/2011   UNEQUAL LEG LENGTH 01/10/2010   MUSCLE STRAIN, LEFT CALF 01/10/2010    Current Outpatient Medications on File Prior to Visit  Medication Sig Dispense Refill   DULoxetine (CYMBALTA) 60 MG capsule TAKE 1 CAPSULE (60 MG) DAILY 90 capsule 1   ELIQUIS 5 MG TABS tablet TAKE 1 TABLET BY MOUTH TWICE A DAY 60 tablet 11    Evolocumab (REPATHA SURECLICK) 782 MG/ML SOAJ Inject 1 pen into the skin every 14 (fourteen) days. 2 mL 11   LORazepam (ATIVAN) 0.5 MG tablet Take 1 tablet (0.5 mg total) by mouth every 8 (eight) hours. 30 tablet 1   mirtazapine (REMERON) 30 MG tablet Take 1 tablet (30 mg total) by mouth at bedtime. 90 tablet 1   Protein POWD Take 1 Scoop by mouth See admin instructions. Take monday- Friday vega essentials shake     rosuvastatin (CRESTOR) 40 MG tablet Take 1 tablet (40 mg total) by mouth daily. 30 tablet 6   sulfamethoxazole-trimethoprim (BACTRIM DS) 800-160 MG tablet Take 1 tablet by mouth 2 (two) times daily.     No current facility-administered medications on file prior to visit.    No Known Allergies  Objective: Physical Exam  General: Well developed, nourished, no acute distress, awake, alert and oriented x 3  Vascular: Dorsalis pedis artery 2/4 bilateral, Posterior tibial artery 2/4 bilateral, skin temperature warm to warm proximal to distal bilateral lower extremities, no varicosities, pedal hair present bilateral.  Neurological: Gross sensation present via light touch bilateral.   Dermatological: Skin is warm, dry, and supple bilateral, Nails 1-10 are tender, short thick, and discolored with mild subungal debris, no webspace macerations present bilateral, no open lesions present bilateral, Minimal callus tissue present bilateral heels but there is a nucleated core noted on the right heel. No signs of infection bilateral.  Musculoskeletal: Asymptomatic hammertoe boney deformities noted bilateral. Muscular strength within normal limits without painon range of motion. No pain with calf compression bilateral.  Fungal  culture + T rubrum  Assessment and Plan:  Problem List Items Addressed This Visit   None Visit Diagnoses     Nail fungus    -  Primary   Relevant Medications   sulfamethoxazole-trimethoprim (BACTRIM DS) 800-160 MG tablet   Dry skin       Porokeratosis       Pain  in toes of both feet           -Examined patient -Re-Discussed treatment options for painful mycotic nails -Patient elects to try Laser -At no charge mechanically debride keratosis at right heel x1 without incident and applied salinocaine and bandaid -Encouraged skin emollients -Patient to return for laser or sooner if symptoms worsen.  Landis Martins, DPM

## 2020-11-15 NOTE — Telephone Encounter (Signed)
   Name: Derrick Compton  DOB: 02/17/1935  MRN: [id number]   Primary Cardiologist: Quay Burow, MD  Chart reviewed as part of pre-operative protocol coverage. Patient was contacted 11/15/2020 in reference to pre-operative risk assessment for pending surgery as outlined below.  Derrick Compton was last seen on 09/25/20 by Coletta Memos NP.  Since that day, Derrick Compton has done well. He continues to exercise and can complete more than 4.0 METS (40-60 min of elliptical/treadmill). He does have stable angina with known stenosis of a large diagonal that has been treated medically. He understands he is at increased risk for cardiovascular complications and wishes to proceed.   Per office protocol, he may hold eliquis for 1 day prior to procedure. He does not need preop antibiotics for SBE PPX.    Therefore, based on ACC/AHA guidelines, the patient would be at acceptable risk for the planned procedure without further cardiovascular testing.   The patient was advised that if he develops new symptoms prior to surgery to contact our office to arrange for a follow-up visit, and he verbalized understanding.  I will route this recommendation to the requesting party via Epic fax function and remove from pre-op pool. Please call with questions.  Tami Lin Israel Wunder, PA 11/15/2020, 8:09 AM

## 2020-11-16 ENCOUNTER — Ambulatory Visit (INDEPENDENT_AMBULATORY_CARE_PROVIDER_SITE_OTHER): Payer: Medicare Other

## 2020-11-16 DIAGNOSIS — I480 Paroxysmal atrial fibrillation: Secondary | ICD-10-CM | POA: Diagnosis not present

## 2020-11-19 ENCOUNTER — Other Ambulatory Visit: Payer: Self-pay

## 2020-11-19 ENCOUNTER — Ambulatory Visit (INDEPENDENT_AMBULATORY_CARE_PROVIDER_SITE_OTHER): Payer: Medicare Other

## 2020-11-19 DIAGNOSIS — B351 Tinea unguium: Secondary | ICD-10-CM

## 2020-11-19 NOTE — Progress Notes (Signed)
Patient presents today for the 1st laser treatment. Diagnosed with mycotic nail infection by Dr. Cannon Kettle.   Toenail most affected bilateral 1-5.  All other systems are negative.  Nails were filed thin. Laser therapy was administered to bilateral 1-5 toenails  and patient tolerated the treatment well. All safety precautions were in place.    Follow up in 4 weeks for laser # 2.

## 2020-11-19 NOTE — Patient Instructions (Signed)

## 2020-11-25 ENCOUNTER — Other Ambulatory Visit: Payer: Self-pay | Admitting: Cardiovascular Disease

## 2020-11-30 NOTE — Progress Notes (Signed)
Carelink Summary Report / Loop Recorder 

## 2020-12-10 ENCOUNTER — Other Ambulatory Visit: Payer: Self-pay | Admitting: Cardiovascular Disease

## 2020-12-17 ENCOUNTER — Ambulatory Visit (INDEPENDENT_AMBULATORY_CARE_PROVIDER_SITE_OTHER): Payer: Medicare Other

## 2020-12-17 DIAGNOSIS — I480 Paroxysmal atrial fibrillation: Secondary | ICD-10-CM

## 2020-12-18 NOTE — Telephone Encounter (Signed)
Returned call to pt he states that his BP has been having CP intermittently thru-out the day. He states that Saturday he was on the treadmill and his HR went to to 120, and he was feeling nauseous and HR went down to 40-50.[name] He stopped and with rest it went back to normal within about 5 min. Usually when he is on the tread mill(for about an hour)no CP. When he is on the elliptical he does get CP but once again it is resolved within about 5 minutes. Also, when he is light weight lifting he has SOB and nausea. So he stops and again resolved in about 5 minutes. This has been going on for 2-3 months but since Saturday it is worse. This does not only happen when he is exercising, it will also happen thruout the day with no exertion. He cannot link these times to anything. It wil happen with and without exertion. He does say that he has been more stressed lately and he thought that was why this was happening. This is why he did not call until now. He will continue to track and call if needed. Is there anything else he should be doing, please advise.

## 2020-12-18 NOTE — Telephone Encounter (Signed)
Transmission from 12/18/20 reflects no significant arrythmia.  Histograms do reflect minimal HR excursion >220

## 2020-12-19 LAB — CUP PACEART REMOTE DEVICE CHECK
Date Time Interrogation Session: 20220726063804
Implantable Pulse Generator Implant Date: 20211103

## 2020-12-31 ENCOUNTER — Ambulatory Visit (INDEPENDENT_AMBULATORY_CARE_PROVIDER_SITE_OTHER): Payer: Medicare Other | Admitting: Internal Medicine

## 2020-12-31 ENCOUNTER — Other Ambulatory Visit: Payer: Self-pay

## 2020-12-31 ENCOUNTER — Ambulatory Visit: Payer: Medicare Other

## 2020-12-31 VITALS — BP 112/60 | HR 62 | Ht 70.0 in | Wt 155.4 lb

## 2020-12-31 DIAGNOSIS — I480 Paroxysmal atrial fibrillation: Secondary | ICD-10-CM | POA: Diagnosis not present

## 2020-12-31 DIAGNOSIS — B351 Tinea unguium: Secondary | ICD-10-CM

## 2020-12-31 DIAGNOSIS — I2510 Atherosclerotic heart disease of native coronary artery without angina pectoris: Secondary | ICD-10-CM

## 2020-12-31 NOTE — Progress Notes (Signed)
PCP: Alonna Buckler, MD Primary Cardiologist: Dr Gwenlyn Found Primary EP: Dr Gigi Gin Derrick Compton is a 86 y.o. male who presents today for routine electrophysiology followup.  Since last being seen in our clinic, the patient reports doing reasonably well.  He has begun having chest discomfort and SOB with exercise.  This has been more prominent since his last visit.  He has scheduled an appointment with Dr Gwenlyn Found for later this month to discuss.  Today, he denies symptoms of palpitations,  lower extremity edema, dizziness, presyncope, or syncope.  The patient is otherwise without complaint today.   Past Medical History:  Diagnosis Date   Anxiety    BPH (benign prostatic hyperplasia)    CAD (coronary artery disease)    Chest pain    Hyperlipidemia    Osteopenia    Paroxysmal atrial fibrillation (HCC)    Past Surgical History:  Procedure Laterality Date   ATRIAL FIBRILLATION ABLATION N/A 11/10/2019   Procedure: ATRIAL FIBRILLATION ABLATION;  Surgeon: [name], MD;  Location: [facility];  Service: Cardiovascular;  Laterality: N/A;   implantable loop recorder placement  03/28/2020    Medtronic Reveal Linq model LNQ 22 (SN [id number] ) implantable loop recorder implanted for afib management   LEFT HEART CATH AND CORONARY ANGIOGRAPHY N/A 02/05/2017   Procedure: LEFT HEART CATH AND CORONARY ANGIOGRAPHY;  Surgeon: Lorretta Harp, MD;  Location: [facility];  Service: Cardiovascular;  Laterality: N/A;    ROS- all systems are reviewed and negatives except as per HPI above  Current Outpatient Medications  Medication Sig Dispense Refill   DULoxetine (CYMBALTA) 60 MG capsule TAKE 1 CAPSULE (60 MG) DAILY 90 capsule 1   ELIQUIS 5 MG TABS tablet TAKE 1 TABLET BY MOUTH TWICE A DAY 60 tablet 11   LORazepam (ATIVAN) 0.5 MG tablet Take 1 tablet (0.5 mg total) by mouth every 8 (eight) hours. 30 tablet 1   mirtazapine (REMERON) 30 MG tablet Take 1 tablet (30 mg total) by mouth at  bedtime. 90 tablet 1   Protein POWD Take 1 Scoop by mouth See admin instructions. Take monday- Friday vega essentials shake     REPATHA SURECLICK [id number] MG/ML SOAJ INJECT 1 PEN INTO THE SKIN EVERY 14 (FOURTEEN) DAYS. 2 mL 11   rosuvastatin (CRESTOR) 40 MG tablet TAKE 1 TABLET BY MOUTH EVERY DAY 90 tablet 2   sulfamethoxazole-trimethoprim (BACTRIM DS) 800-160 MG tablet Take 1 tablet by mouth 2 (two) times daily.     No current facility-administered medications for this visit.    Physical Exam: Vitals:   12/31/20 1510  BP: 112/60  Pulse: 62  SpO2: 96%  Weight: 155 lb 6.4 oz (70.5 kg)  Height: '5\' 10"'$  (1.778 m)    GEN- The patient is well appearing, alert and oriented x 3 today.   Head- normocephalic, atraumatic Eyes-  Sclera clear, conjunctiva pink Ears- hearing intact Oropharynx- clear Lungs- Clear to ausculation bilaterally, normal work of breathing Heart- Regular rate and rhythm, no murmurs, rubs or gallops, PMI not laterally displaced GI- soft, NT, ND, + BS Extremities- no clubbing, cyanosis, or edema  Wt Readings from Last 3 Encounters:  12/31/20 155 lb 6.4 oz (70.5 kg)  09/25/20 155 lb 9.6 oz (70.6 kg)  07/24/20 156 lb (70.8 kg)    EKG tracing ordered today is personally reviewed and shows sinus rhythm with PACs, 62 bpm, LAHB  Assessment and Plan:  Paroxysmal atrial fibrillation Doing well post ablation off AAD therapy  0% afib by ILR Chads2vasc score is 3.   Continue eliquis  2. CAD He has exertional chest discomfort.  We discussed at length.  Cath from 2018 reveals obstructive CAD for which medical therapy was advised.  Cardiac CT 2021 did not show obstructive change.  Echo 2/22 also reviewed. I have advised that he follow-up with Dr Gwenlyn Found in a few weeks as scheduled to discuss further evaluation and management. Given sinus bradycardia, I did not add beta blocker today  3. Sinus bradycardia On ILR, his heart rate trends are relatively normal.  Though I would not  add a beta blocker at this time, he does not warrant a pacemaker currently.  Return in a year to see me Followup with Dr Gwenlyn Found as scheduled  [name] MD, [facility] 12/31/2020 3:14 PM

## 2020-12-31 NOTE — Patient Instructions (Addendum)
Medication Instructions:  Your physician recommends that you continue on your current medications as directed. Please refer to the Current Medication list given to you today.  Labwork: None ordered.  Testing/Procedures: None ordered.  Follow-Up: Your physician wants you to follow-up in: 01/06/22 at 9:45 am with [name], MD    Any Other Special Instructions Will Be Listed Below (If Applicable).  If you need a refill on your cardiac medications before your next appointment, please call your pharmacy.

## 2020-12-31 NOTE — Progress Notes (Signed)
Patient presents today for the 2nd laser treatment. Diagnosed with mycotic nail infection by Dr. Cannon Kettle.   Toenail most affected bilateral 1-5.  All other systems are negative.  Nails were filed thin. Laser therapy was administered to bilateral 1-5 toenails  and patient tolerated the treatment well. All safety precautions were in place.    Follow up in 4 weeks for laser # 3.

## 2021-01-10 NOTE — Progress Notes (Signed)
Carelink Summary Report / Loop Recorder 

## 2021-01-16 ENCOUNTER — Ambulatory Visit: Payer: Medicare Other | Admitting: Cardiovascular Disease

## 2021-01-17 ENCOUNTER — Ambulatory Visit (INDEPENDENT_AMBULATORY_CARE_PROVIDER_SITE_OTHER): Payer: Medicare Other

## 2021-01-17 DIAGNOSIS — I480 Paroxysmal atrial fibrillation: Secondary | ICD-10-CM

## 2021-01-21 LAB — CUP PACEART REMOTE DEVICE CHECK
Date Time Interrogation Session: 20220828063356
Implantable Pulse Generator Implant Date: 20211103

## 2021-01-31 NOTE — Progress Notes (Signed)
Carelink Summary Report / Loop Recorder 

## 2021-02-01 ENCOUNTER — Other Ambulatory Visit: Payer: Medicare Other

## 2021-02-06 IMAGING — CT CT HEART MORP W/ CTA COR W/ SCORE W/ CA W/CM &/OR W/O CM
4 of 7 series · 8 of 20 positions shown, 9 images · IV contrast (APPLIED)
Comparison: Chest CT 05/13/2019.
COMPARISON: Chest CT 05/13/2019.

Addendum:
EXAM:
OVER-READ INTERPRETATION  CT CHEST

The following report is an over-read performed by radiologist Dr.
Lacina Chino [REDACTED] on 09/12/2019. This
over-read does not include interpretation of cardiac or coronary
anatomy or pathology. The coronary calcium score/coronary CTA
interpretation by the cardiologist is attached.
CLINICAL DATA: 84 yo male with chest pain
Cardiac/Coronary  CT
TECHNIQUE: The patient was scanned on a Phillips Force scanner.

[Series 6: best diast 75 % · axial · 0.39mm/px · z∈[-146,-92]mm · 2 of 408 slices shown]
[im 136/408  vessel]
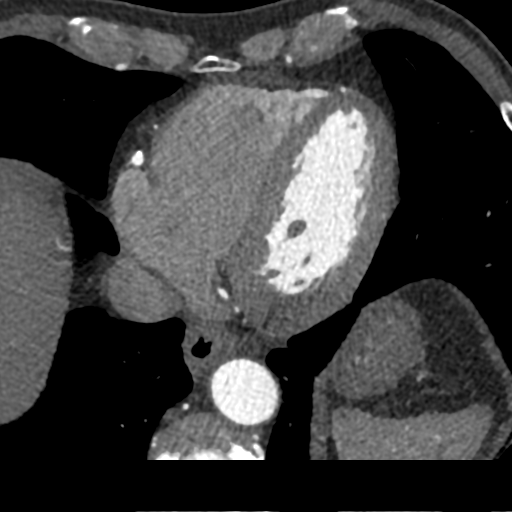
[im 272/408  vessel]
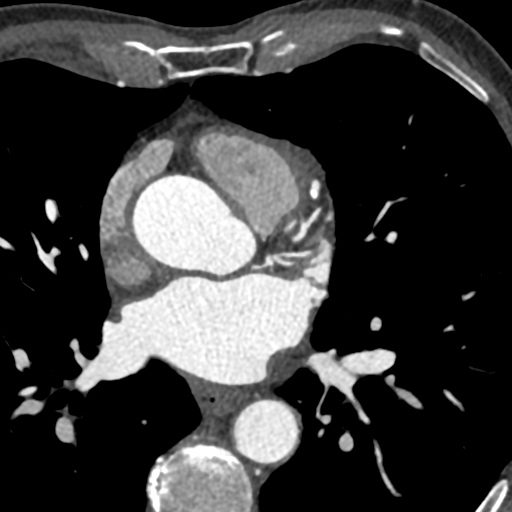

[Series 7: best syst · axial · 0.39mm/px · z∈[-146,-92]mm · 2 of 408 slices shown, 3 images]
[im 136/408  vessel]
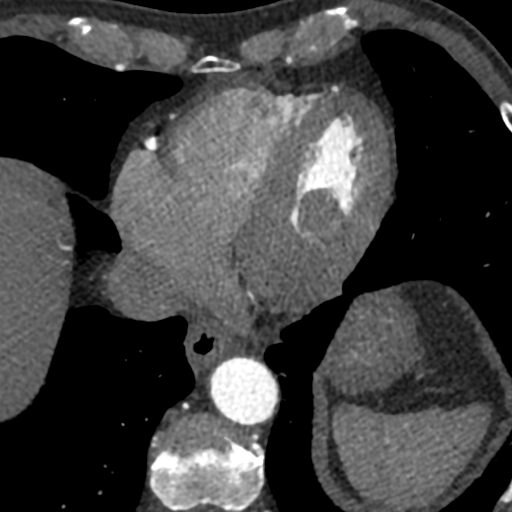
[im 136/408  lung]
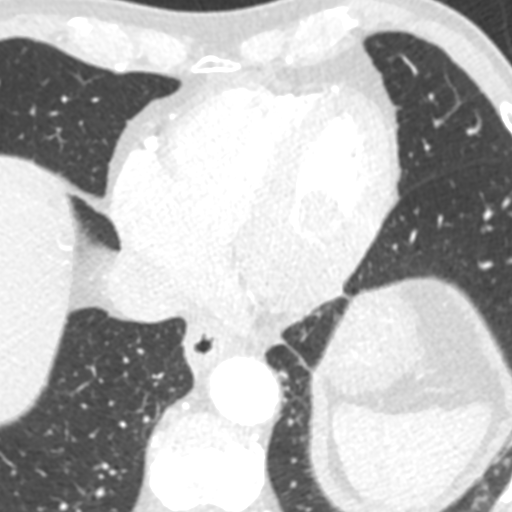
[im 272/408  vessel]
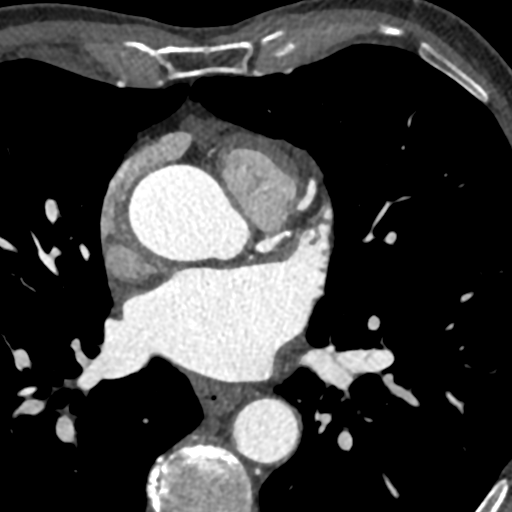

[Series 8: ts diast sharp 38 % · axial · 0.39mm/px · z∈[-146,-92]mm · 2 of 408 slices shown]
[im 136/408  lung]
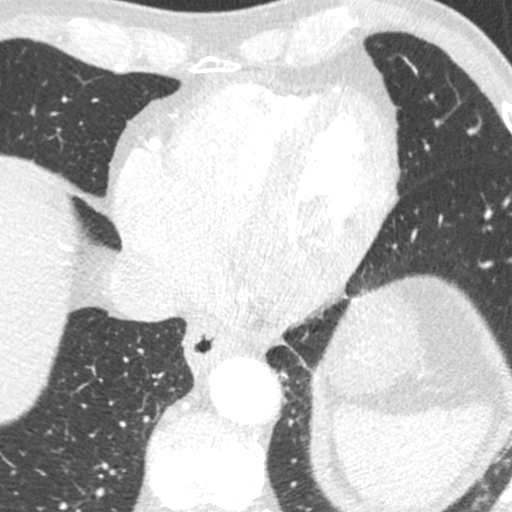
[im 272/408  lung]
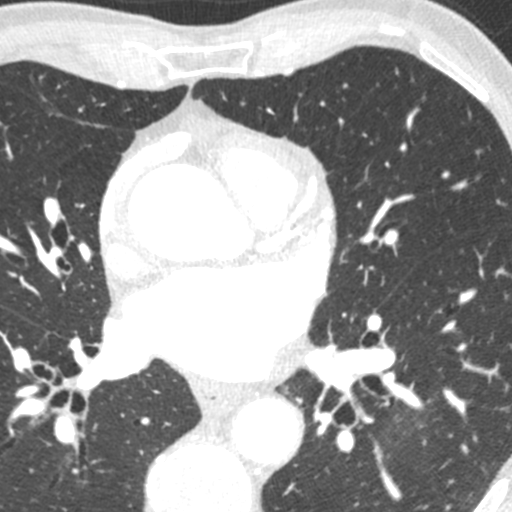

[Series 9: ts syst sharp 38 % · axial · 0.39mm/px · z∈[-146,-92]mm · 2 of 408 slices shown]
[im 136/408  lung]
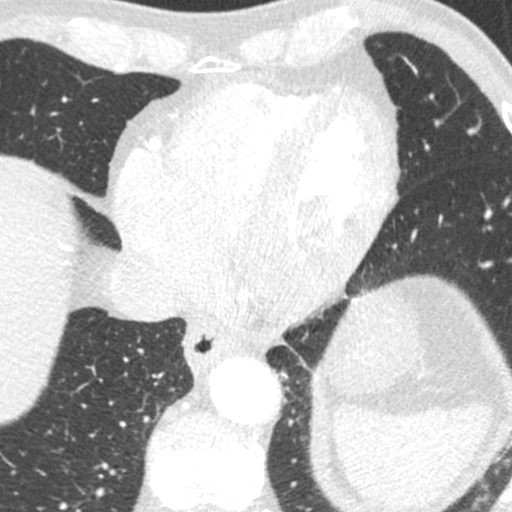
[im 272/408  lung]
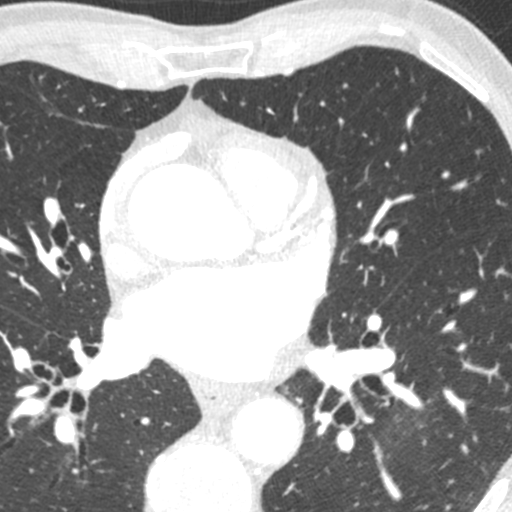

[8 of 20 positions shown; findings below may reference images not displayed]

FINDINGS: Aortic atherosclerosis. Multiple tiny 2-3 mm pulmonary nodules
scattered throughout the lungs bilaterally, nonspecific but
statistically likely benign. Within the visualized portions of the
thorax there are no other larger more suspicious appearing pulmonary
nodules or masses, there is no acute consolidative airspace disease,
no pleural effusions, no pneumothorax and no lymphadenopathy.
Visualized portions of the upper abdomen are unremarkable. There are
no aggressive appearing lytic or blastic lesions noted in the
visualized portions of the skeleton.
IMPRESSION: 1. Multiple small pulmonary nodules measuring 2-3 mm scattered
throughout the lungs bilaterally, nonspecific, but statistically
likely benign. No follow-up needed if patient is low-risk (and has
no known or suspected primary neoplasm). Non-contrast chest CT can
be considered in 12 months if patient is high-risk. This
recommendation follows the consensus statement: Guidelines for
Management of Incidental Pulmonary Nodules Detected on CT Images:
2. Aortic Atherosclerosis (SJEWB-THD.D).
FINDINGS: A 120 kV prospective scan was triggered in the descending thoracic
aorta at 111 HU's. Axial non-contrast 3 mm slices were carried out
through the heart. The data set was analyzed on a dedicated work
station and scored using the Agatson method. Gantry rotation speed
was 250 msecs and collimation was .6 mm. No beta blockade and 0.8 mg
of sl NTG was given. The 3D data set was reconstructed in 5%
intervals of the 67-82 % of the R-R cycle. Diastolic phases were
analyzed on a dedicated work station using MPR, MIP and VRT modes.
The patient received 80 cc of contrast.

Aorta:  Normal size.  Aortic atherosclerosis noted.  No dissection.

Aortic Valve:  Trileaflet.  No calcifications.

Coronary Arteries:  Normal coronary origin.  Right dominance.

RCA is a very large dominant artery that gives rise to PDA and PLVB.
There is minimal (0-24%) calcified stenosis in the mid vessel.

Left main is a large artery that gives rise to LAD, ramus
intermedius and LCX arteries. There is minimal (0-24%) calcified
plaque in the distal vessel.

LAD is a large vessel that gives rise to large branching D1; there
is minimal (0-24%) calcified stenosis in the mid vessel. There is
mild (25-49%) folllowed by minimal (0-24%) calcifed stenoses in the
ostial and proximal D1.

Ramus intermedius is a branching vessel with minimal (0-24%)
calcified stenosis in the proximal vessel.

LCX is a non-dominant artery that gives rise to one OM1 branch.
There is no plaque.

Other findings:

Normal pulmonary vein drainage into the left atrium.

Normal let atrial appendage without a thrombus.

Normal size of the pulmonary artery.
IMPRESSION: 1. Coronary calcium score of 217. This was 34 percentile for age and
sex matched control.

2. Normal coronary origin with right dominance.

3. Mild CAD with 25-49% stenosis in the ostial D1; mimimal disease
elsewhere; [id number].

Donghyeon Mike

*** End of Addendum ***
EXAM:
OVER-READ INTERPRETATION  CT CHEST

The following report is an over-read performed by radiologist Dr.
Lacina Chino [REDACTED] on 09/12/2019. This
over-read does not include interpretation of cardiac or coronary
anatomy or pathology. The coronary calcium score/coronary CTA
interpretation by the cardiologist is attached.
FINDINGS: Aortic atherosclerosis. Multiple tiny 2-3 mm pulmonary nodules
scattered throughout the lungs bilaterally, nonspecific but
statistically likely benign. Within the visualized portions of the
thorax there are no other larger more suspicious appearing pulmonary
nodules or masses, there is no acute consolidative airspace disease,
no pleural effusions, no pneumothorax and no lymphadenopathy.
Visualized portions of the upper abdomen are unremarkable. There are
no aggressive appearing lytic or blastic lesions noted in the
visualized portions of the skeleton.
IMPRESSION: 1. Multiple small pulmonary nodules measuring 2-3 mm scattered
throughout the lungs bilaterally, nonspecific, but statistically
likely benign. No follow-up needed if patient is low-risk (and has
no known or suspected primary neoplasm). Non-contrast chest CT can
be considered in 12 months if patient is high-risk. This
recommendation follows the consensus statement: Guidelines for
Management of Incidental Pulmonary Nodules Detected on CT Images:
2. Aortic Atherosclerosis (SJEWB-THD.D).

## 2021-03-29 ENCOUNTER — Other Ambulatory Visit: Payer: Self-pay

## 2021-03-29 ENCOUNTER — Ambulatory Visit: Payer: Medicare Other | Admitting: *Deleted

## 2021-03-29 DIAGNOSIS — B351 Tinea unguium: Secondary | ICD-10-CM

## 2021-03-29 NOTE — Progress Notes (Signed)
Patient presents today for the 3rd laser treatment. Diagnosed with mycotic nail infection by Dr. Cannon Kettle.   Toenail most affected bilateral 1-5. No real improvement at this time.  All other systems are negative.  Nails were filed thin. Laser therapy was administered to bilateral 1-5 toenails  and patient tolerated the treatment well. All safety precautions were in place.    Follow up in 6 weeks for laser # 4.

## 2021-04-08 ENCOUNTER — Other Ambulatory Visit: Payer: Self-pay | Admitting: Psychiatry

## 2021-04-08 DIAGNOSIS — F5105 Insomnia due to other mental disorder: Secondary | ICD-10-CM

## 2021-04-08 DIAGNOSIS — F331 Major depressive disorder, recurrent, moderate: Secondary | ICD-10-CM

## 2021-04-12 ENCOUNTER — Other Ambulatory Visit: Payer: Self-pay

## 2021-04-12 ENCOUNTER — Ambulatory Visit (INDEPENDENT_AMBULATORY_CARE_PROVIDER_SITE_OTHER): Payer: Medicare Other | Admitting: Cardiovascular Disease

## 2021-04-12 ENCOUNTER — Encounter: Payer: Self-pay | Admitting: Cardiovascular Disease

## 2021-04-12 VITALS — BP 106/60 | HR 71 | Ht 70.0 in | Wt 157.2 lb

## 2021-04-12 DIAGNOSIS — Z8679 Personal history of other diseases of the circulatory system: Secondary | ICD-10-CM

## 2021-04-12 DIAGNOSIS — Z9889 Other specified postprocedural states: Secondary | ICD-10-CM | POA: Diagnosis not present

## 2021-04-12 DIAGNOSIS — I480 Paroxysmal atrial fibrillation: Secondary | ICD-10-CM

## 2021-04-12 DIAGNOSIS — E7800 Pure hypercholesterolemia, unspecified: Secondary | ICD-10-CM

## 2021-04-12 DIAGNOSIS — I2510 Atherosclerotic heart disease of native coronary artery without angina pectoris: Secondary | ICD-10-CM | POA: Diagnosis not present

## 2021-04-12 DIAGNOSIS — I350 Nonrheumatic aortic (valve) stenosis: Secondary | ICD-10-CM

## 2021-04-12 NOTE — Patient Instructions (Addendum)
Medication Instructions:  Your physician recommends that you continue on your current medications as directed. Please refer to the Current Medication list given to you today.  *If you need a refill on your cardiac medications before your next appointment, please call your pharmacy*  Testing/Procedures: Your physician has requested that you have an echocardiogram. Echocardiography is a painless test that uses sound waves to create images of your heart. It provides your doctor with information about the size and shape of your heart and how well your heart's chambers and valves are working. This procedure takes approximately one hour. There are no restrictions for this procedure. This procedure is done at [address].   Follow-Up: At [facility], you and your health needs are our priority.  As part of our continuing mission to provide you with exceptional heart care, we have created designated Provider Care Teams.  These Care Teams include your primary Cardiologist (physician) and Advanced Practice Providers (APPs -  Physician Assistants and Nurse Practitioners) who all work together to provide you with the care you need, when you need it.  We recommend signing up for the patient portal called "MyChart".  Sign up information is provided on this After Visit Summary.  MyChart is used to connect with patients for Virtual Visits (Telemedicine).  Patients are able to view lab/test results, encounter notes, upcoming appointments, etc.  Non-urgent messages can be sent to your provider as well.   To learn more about what you can do with MyChart, go to [email or web address].    Your next appointment:   12 month(s)  The format for your next appointment:   In Person  Provider:   Quay Burow, MD

## 2021-04-12 NOTE — Assessment & Plan Note (Signed)
Mild aortic stenosis by 2D echo performed in 2018.  I am going to repeat a 2D echocardiogram.

## 2021-04-12 NOTE — Assessment & Plan Note (Signed)
History of hyperlipidemia on Repatha with lipid profile performed 03/30/2020 revealing total cholesterol 101, LDL 36 and HDL 50.

## 2021-04-12 NOTE — Assessment & Plan Note (Signed)
History of coronary artery disease test for chronic heart catheterization by myself 02/05/2017 revealing a 90% ostial first diagonal branch stenosis with otherwise no significant CAD.  The diagonal branch arose from the LAD and a 90% angle making intervention high risk.  The patient is very active and has minimal angina.

## 2021-04-12 NOTE — Progress Notes (Signed)
04/12/2021 Derrick Compton   04/14/1935  [id number]  Primary Physician Alonna Buckler, MD Primary Cardiologist: Lorretta Harp MD Renae Gloss  HPI:  Derrick Compton is a 86 y.o.  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 He was referred by the [facility] clinic for cardiovascular evaluation because of proximity and progressive chest pain. I last saw him in the office 01/03/2020.  His only cardiovascular risk factor is hyperlipidemia. He did have a coronary calcium score performed 10/04/15 which was 181 a subsequent GXT which was normal. He exercises frequently has noticed progressive substernal chest pain with running. I perform Myoview stress testing on 11/11/16 which was entirely normal 2-D echo performed 12/09/16 showed mild aortic stenosis. He studies continued symptoms I ordered a coronary CTA/FFR on 12/24/16 showed a significant lesion in a diagonal branch.   He underwent cardiac catheterization by myself 02/05/17 revealing a 90% ostial high first diagonal branch stenosis which came off orthogonal to the LAD making percutaneous intervention high risk. I do not think that the risk of intervention would be worth potential benefit of revascularization. The patient, because of his relative hypotension and bradycardia is not a good candidate for anginal medications.    He has developed A. fib seen on an event monitor 06/23/2019.  He did see Dr. Rayann Heman in the clinic for evaluation of this and was placed on Eliquis.  Over the last several months he is noticed increasing dyspnea on exertion as well as substernal chest pressure on exertion .  He also gets episodes related to fib several times a week with heart rates up to 150 which he can tell on his apple watch.  Due to these episodes he does get some substernal chest pain.  Recent coronary CTA performed 09/12/2019  revealed a coronary calcium score of 217 without significant CAD.  He saw Dr. Rayann Heman in the office 09/19/2019 2 again discussed A. fib ablation which now he wishes to pursue.   He underwent successful A. fib ablation by Dr. Rayann Heman 11/10/2019.  He remains on Eliquis oral anticoagulation and continues to maintain sinus rhythm.  He does get some effort angina when exercising since that time.  He did have a coronary CTA performed 09/12/2019 that only showed first diagonal branch ostial disease and otherwise no evidence of CAD, consistent with his heart cath performed 02/05/2017.  Since I saw him a year ago he continues to do well.  He still very active and exercises 6 days a week working out on his elliptical treadmill and lifting weights.  He gets occasional chest pain but this is not often.  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   Current Meds  Medication Sig   DULoxetine (CYMBALTA) 60 MG capsule TAKE 1 CAPSULE (60 MG) DAILY   ELIQUIS 5 MG TABS tablet TAKE 1 TABLET BY MOUTH TWICE A DAY   mirtazapine (REMERON) 30 MG tablet TAKE 1 TABLET BY MOUTH AT BEDTIME.   Protein POWD Take 1 Scoop by mouth See admin instructions. Take monday- Friday vega essentials shake   REPATHA SURECLICK 542 MG/ML SOAJ INJECT 1 PEN INTO THE SKIN EVERY 14 (FOURTEEN) DAYS.   rosuvastatin (CRESTOR) 40 MG tablet TAKE 1 TABLET BY MOUTH EVERY DAY     No Known Allergies  Social History   Socioeconomic History   Marital status: Married    Spouse  name: Not on file   Number of children: Not on file   Years of education: Not on file   Highest education level: Not on file  Occupational History   Not on file  Tobacco Use   Smoking status: Never   Smokeless tobacco: Never  Substance and Sexual Activity   Alcohol use: Yes    Alcohol/week: 2.0 standard drinks    Types: 2 Standard drinks or equivalent per week   Drug use: No   Sexual activity: Not on file  Other Topics Concern   Not on file  Social  History Narrative   Lives in [place] with wife.      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   Social Determinants of Health   Financial Resource Strain: Not on file  Food Insecurity: Not on file  Transportation Needs: Not on file  Physical Activity: Not on file  Stress: Not on file  Social Connections: Not on file  Intimate Partner Violence: Not on file     Review of Systems: General: negative for chills, fever, night sweats or weight changes.  Cardiovascular: negative for chest pain, dyspnea on exertion, edema, orthopnea, palpitations, paroxysmal nocturnal dyspnea or shortness of breath Dermatological: negative for rash Respiratory: negative for cough or wheezing Urologic: negative for hematuria Abdominal: negative for nausea, vomiting, diarrhea, bright red blood per rectum, melena, or hematemesis Neurologic: negative for visual changes, syncope, or dizziness All other systems reviewed and are otherwise negative except as noted above.    Blood pressure 106/60, pulse 71, height 5\' 10"  (1.778 m), weight 157 lb 3.2 oz (71.3 kg), SpO2 98 %.  General appearance: alert and no distress Neck: no adenopathy, no carotid bruit, no JVD, supple, symmetrical, trachea midline, and thyroid not enlarged, symmetric, no tenderness/mass/nodules Lungs: clear to auscultation bilaterally Heart: regular rate and rhythm, S1, S2 normal, no murmur, click, rub or gallop Extremities: extremities normal, atraumatic, no cyanosis or edema Pulses: 2+ and symmetric Skin: Skin color, texture, turgor normal. No rashes or lesions Neurologic: Grossly normal  EKG sinus rhythm at 71 with a nonspecific IVCD, septal Q waves and left anterior fascicular block.  I personally reviewed this EKG.  ASSESSMENT AND PLAN:   Hyperlipidemia History of hyperlipidemia on Repatha with lipid profile performed 03/30/2020 revealing total cholesterol 101, LDL 36 and HDL 50.  S/P ablation of atrial  fibrillation History of PAF status post successful ablation by Dr. Rayann Heman 11/10/2019 maintaining sinus rhythm on Eliquis oral anticoagulation.  Coronary artery disease History of coronary artery disease test for chronic heart catheterization by myself 02/05/2017 revealing a 90% ostial first diagonal branch stenosis with otherwise no significant CAD.  The diagonal branch arose from the LAD and a 90% angle making intervention high risk.  The patient is very active and has minimal angina.  Aortic stenosis, mild Mild aortic stenosis by 2D echo performed in 2018.  I am going to repeat a 2D echocardiogram.     Lorretta Harp MD [facility], [facility] 04/12/2021 9:54 AM

## 2021-04-12 NOTE — Assessment & Plan Note (Signed)
History of PAF status post successful ablation by Dr. Rayann Heman 11/10/2019 maintaining sinus rhythm on Eliquis oral anticoagulation.

## 2021-04-29 ENCOUNTER — Ambulatory Visit (INDEPENDENT_AMBULATORY_CARE_PROVIDER_SITE_OTHER): Payer: Medicare Other

## 2021-04-29 DIAGNOSIS — I480 Paroxysmal atrial fibrillation: Secondary | ICD-10-CM | POA: Diagnosis not present

## 2021-04-30 LAB — CUP PACEART REMOTE DEVICE CHECK
Date Time Interrogation Session: 20221205063849
Implantable Pulse Generator Implant Date: 20211103

## 2021-05-08 ENCOUNTER — Other Ambulatory Visit: Payer: Self-pay | Admitting: Psychiatry

## 2021-05-08 DIAGNOSIS — F4001 Agoraphobia with panic disorder: Secondary | ICD-10-CM

## 2021-05-08 DIAGNOSIS — F331 Major depressive disorder, recurrent, moderate: Secondary | ICD-10-CM

## 2021-05-08 DIAGNOSIS — F411 Generalized anxiety disorder: Secondary | ICD-10-CM

## 2021-05-08 NOTE — Telephone Encounter (Signed)
Call to RS

## 2021-05-08 NOTE — Telephone Encounter (Signed)
Last seen 5/10 due back at 6 months

## 2021-05-08 NOTE — Progress Notes (Signed)
Carelink Summary Report / Loop Recorder 

## 2021-05-09 ENCOUNTER — Other Ambulatory Visit: Payer: Self-pay | Admitting: Oncology

## 2021-05-09 DIAGNOSIS — R911 Solitary pulmonary nodule: Secondary | ICD-10-CM

## 2021-05-09 DIAGNOSIS — R9389 Abnormal findings on diagnostic imaging of other specified body structures: Secondary | ICD-10-CM

## 2021-05-10 ENCOUNTER — Other Ambulatory Visit: Payer: Medicare Other

## 2021-05-10 NOTE — Telephone Encounter (Signed)
LVM to Schedule appt

## 2021-05-14 ENCOUNTER — Ambulatory Visit (HOSPITAL_COMMUNITY): Payer: Medicare Other | Attending: Cardiology

## 2021-05-14 ENCOUNTER — Other Ambulatory Visit: Payer: Self-pay

## 2021-05-14 DIAGNOSIS — I480 Paroxysmal atrial fibrillation: Secondary | ICD-10-CM | POA: Diagnosis not present

## 2021-05-14 DIAGNOSIS — I2510 Atherosclerotic heart disease of native coronary artery without angina pectoris: Secondary | ICD-10-CM

## 2021-05-14 DIAGNOSIS — I350 Nonrheumatic aortic (valve) stenosis: Secondary | ICD-10-CM | POA: Diagnosis present

## 2021-05-14 LAB — ECHOCARDIOGRAM COMPLETE
AR max vel: 2.55 cm2
AV Area VTI: 2.76 cm2
AV Area mean vel: 2.44 cm2
AV Mean grad: 5.3 mmHg
AV Peak grad: 10 mmHg
Ao pk vel: 1.58 m/s
Area-P 1/2: 2.56 cm2
S' Lateral: 3.1 cm

## 2021-05-21 ENCOUNTER — Ambulatory Visit
Admission: RE | Admit: 2021-05-21 | Discharge: 2021-05-21 | Disposition: A | Discharge status: Home / Self Care | Payer: Medicare Other | Source: Ambulatory Visit | Attending: Oncology | Admitting: Oncology

## 2021-05-21 ENCOUNTER — Other Ambulatory Visit: Payer: Self-pay

## 2021-05-21 DIAGNOSIS — R9389 Abnormal findings on diagnostic imaging of other specified body structures: Secondary | ICD-10-CM

## 2021-05-21 DIAGNOSIS — R911 Solitary pulmonary nodule: Secondary | ICD-10-CM

## 2021-05-31 ENCOUNTER — Other Ambulatory Visit: Payer: Medicare Other

## 2021-06-03 ENCOUNTER — Ambulatory Visit (INDEPENDENT_AMBULATORY_CARE_PROVIDER_SITE_OTHER): Payer: Medicare Other

## 2021-06-03 DIAGNOSIS — I480 Paroxysmal atrial fibrillation: Secondary | ICD-10-CM | POA: Diagnosis not present

## 2021-06-03 LAB — CUP PACEART REMOTE DEVICE CHECK
Date Time Interrogation Session: 20230108230919
Implantable Pulse Generator Implant Date: 20211103

## 2021-06-11 NOTE — Progress Notes (Signed)
Carelink Summary Report / Loop Recorder 

## 2021-06-14 ENCOUNTER — Other Ambulatory Visit: Payer: Medicare Other

## 2021-06-16 ENCOUNTER — Other Ambulatory Visit: Payer: Self-pay | Admitting: Internal Medicine

## 2021-06-17 NOTE — Telephone Encounter (Signed)
Prescription refill request for Eliquis received. Indication: afib  Last office visit: Derrick Compton 04/12/2021 Scr: 0.91, 01/31/2021 Age: 87 yo  Weight: 71.3 kg   Refill sent.

## 2021-07-06 LAB — CUP PACEART REMOTE DEVICE CHECK
Date Time Interrogation Session: 20230210230636
Implantable Pulse Generator Implant Date: 20211103

## 2021-07-08 ENCOUNTER — Ambulatory Visit (INDEPENDENT_AMBULATORY_CARE_PROVIDER_SITE_OTHER): Payer: Medicare Other

## 2021-07-08 DIAGNOSIS — I480 Paroxysmal atrial fibrillation: Secondary | ICD-10-CM | POA: Diagnosis not present

## 2021-07-10 NOTE — Progress Notes (Signed)
Carelink Summary Report / Loop Recorder 

## 2021-07-19 ENCOUNTER — Ambulatory Visit (INDEPENDENT_AMBULATORY_CARE_PROVIDER_SITE_OTHER): Payer: Medicare Other | Admitting: *Deleted

## 2021-07-19 ENCOUNTER — Other Ambulatory Visit: Payer: Self-pay

## 2021-07-19 DIAGNOSIS — B351 Tinea unguium: Secondary | ICD-10-CM

## 2021-07-19 NOTE — Progress Notes (Signed)
Patient presents today for the 4th laser treatment. Diagnosed with mycotic nail infection by Dr. Cannon Kettle.   Toenail most affected bilateral 1-5. No real improvement at this time.  All other systems are negative.  Nails were filed thin. Laser therapy was administered to bilateral 1-5 toenails  and patient tolerated the treatment well. All safety precautions were in place.    Follow up in 6 weeks for laser # 5.

## 2021-07-30 ENCOUNTER — Other Ambulatory Visit: Payer: Self-pay | Admitting: Psychiatry

## 2021-07-30 DIAGNOSIS — F4001 Agoraphobia with panic disorder: Secondary | ICD-10-CM

## 2021-07-30 DIAGNOSIS — F411 Generalized anxiety disorder: Secondary | ICD-10-CM

## 2021-07-30 DIAGNOSIS — F331 Major depressive disorder, recurrent, moderate: Secondary | ICD-10-CM

## 2021-07-31 NOTE — Telephone Encounter (Signed)
Please schedule appt

## 2021-07-31 NOTE — Telephone Encounter (Signed)
Last seen 5/10 appt due 11/10

## 2021-08-01 NOTE — Telephone Encounter (Signed)
LVM for pt to schedule.

## 2021-08-12 ENCOUNTER — Ambulatory Visit (INDEPENDENT_AMBULATORY_CARE_PROVIDER_SITE_OTHER): Payer: Medicare Other

## 2021-08-12 DIAGNOSIS — R000 Tachycardia, unspecified: Secondary | ICD-10-CM

## 2021-08-12 LAB — CUP PACEART REMOTE DEVICE CHECK
Date Time Interrogation Session: 20230319231538
Implantable Pulse Generator Implant Date: 20211103

## 2021-08-21 NOTE — Progress Notes (Signed)
Carelink Summary Report / Loop Recorder 

## 2021-09-06 ENCOUNTER — Ambulatory Visit (INDEPENDENT_AMBULATORY_CARE_PROVIDER_SITE_OTHER): Payer: Self-pay | Admitting: *Deleted

## 2021-09-06 DIAGNOSIS — B351 Tinea unguium: Secondary | ICD-10-CM

## 2021-09-06 NOTE — Progress Notes (Signed)
Patient presents today for the 5th laser treatment. Diagnosed with mycotic nail infection by Dr. Cannon Kettle.  ? ?Toenail most affected bilateral 1-5. Slight improvement in the nails today. ? ?All other systems are negative. ? ?Nails were filed thin. Laser therapy was administered to bilateral 1-5 toenails  and patient tolerated the treatment well. All safety precautions were in place.  ? ? ?Follow up in 8 weeks for laser # 6. ?

## 2021-09-13 ENCOUNTER — Encounter: Payer: Self-pay | Admitting: Family Medicine

## 2021-09-13 ENCOUNTER — Ambulatory Visit: Payer: Self-pay

## 2021-09-13 ENCOUNTER — Ambulatory Visit (INDEPENDENT_AMBULATORY_CARE_PROVIDER_SITE_OTHER): Payer: Medicare Other | Admitting: Family Medicine

## 2021-09-13 VITALS — BP 106/56 | Ht 70.0 in | Wt 150.0 lb

## 2021-09-13 DIAGNOSIS — M25511 Pain in right shoulder: Secondary | ICD-10-CM | POA: Diagnosis present

## 2021-09-13 NOTE — Progress Notes (Signed)
? ?ELDRIGE PITKIN is a 87 y.o. male who presents to [facility] today for the following: ? ?Right shoulder pain ?Started lifting weights about 4 to 5 months ago for exercise ?States that he was starting to have some pain in his shoulder at 1 time, stopped lifting, and improved ?He was able to play golf through that episode of pain ?States that over the last few weeks, after doing lifts, instead of doing overhead activity he was doing more of a rowing motion ?The following day he had significant soreness ?The pain is worse with overhead activity ?He denies any pain at rest ?States that he has not been able to play golf secondary to the pain ?Has not found anywhere that is tender ?Denies any numbness and tingling ?States that sometimes it does radiate into the anterior upper arm as well as slightly lateral, but no radiation into the hand ?Is able to sleep without pain ?Denies any injury or large popping sensation ?Prior to the last few weeks, he has not had any significant shoulder pain ?He is an avid golfer and has been an excellent athlete throughout his life, he ran the [employer] ?He is right-hand dominant ?No recent XRs found ? ? ?PMH reviewed.  ?ROS as above. ?Medications reviewed. ? ?Exam:  ?BP (!) 106/56   Ht '5\' 10"'$  (1.778 m)   Wt 150 lb (68 kg)   BMI 21.52 kg/m?  ?Gen: Well NAD ?MSK: ? ?Right Shoulder: ?Inspection reveals no obvious deformity, atrophy, or asymmetry b/l. No bruising. No swelling ?Palpation is normal with no TTP over [facility] joint or bicipital groove b/l. ?Full ROM in flexion, abduction, internal/external rotation b/l ?NV intact distally b/l ?Special Tests:  ?- Impingement: Slightly positive Hawkins ?- Supraspinatous: Equivocal empty can ?- Infraspinatous/Teres Minor: 5/5 strength with ER ?- Subscapularis: 5/5 strength with IR, slight pain with testing ?- Biceps tendon: Equivocal Speeds ?- Labrum: Equivocal Obriens ?- AC Joint: Negative cross arm ?- No painful arc and no drop arm  sign ? ?ULTRASOUND: Shoulder, Right  ?Diagnostic complete ultrasound imaging obtained of patient's right shoulder.  ?- No obvious evidence of bony deformity appreciated he does have a shallow bicipital groove.  ?- Long head of the biceps tendon: No evidence of tendon thickening, calcification, subluxation, or tearing in short or long axis views. No edema or bullseye sign. There is a small spur medial to the proximal biceps tendon without swelling. ?- Pec major insertion visualized without abnormality. ?- Subscapularis tendon: complete visualization across the width of the insertion point yielded excellent bulk with a very small calcification and small hypoechoic area at the most proximal tip.  Otherwise no tears or abnormalities noted.  ?- Supraspinatus tendon: complete visualization across the width of the insertion point yielded small partial thickness tear with endplate calcification and minimal retraction at the mid substance of the tendon.  No evidence of complete tear and there is still excellent tendon bulk.  Minimal subacromial bursal fluid noted. ?- Infraspinatus and teres minor tendons: visualization across the width of the insertion points yielded no evidence of tendon thickening, calcification, or tears in the long axis view.  ?- AC Joint: There is osteophytic ridging with small effusion.  ?- Posterior Glenohumeral Joint visualized without abnormality. ?IMPRESSION: findings consistent with supraspinatus mild partial tear with minimal retraction.  There is also some AC arthropathy, but this is asymptomatic. ? ? ? ?No results found. ? ? ?Assessment and Plan: ?1) Pain in joint of right shoulder ?Patient has a small tear to  his supraspinatus musculature and a very small calcification within the subscapularis tendon likely related to overuse and new lifting regimen.  We will have him perform rotator cuff exercises, would hold off on injection as he is not having any rest pain.  Also advised to hold off on  specific upper body lifting at the gym while he is doing these exercises and healing.  We will have him follow-up in 4 to 6 weeks and hopefully he will be significantly improved at that time.  Hopefully then, we can progress him back to his activities as tolerated. ? ? ?Arizona Constable, D.O.  ?PGY-4 [facility]  ?09/13/2021 10:34 AM ?

## 2021-09-13 NOTE — Assessment & Plan Note (Addendum)
Patient has a small tear to his supraspinatus musculature and a very small calcification within the subscapularis tendon likely related to overuse and new lifting regimen.  We will have him perform rotator cuff exercises, would hold off on injection as he is not having any rest pain.  Also advised to hold off on specific upper body lifting at the gym while he is doing these exercises and healing.  We will have him follow-up in 4 to 6 weeks and hopefully he will be significantly improved at that time.  Hopefully then, we can progress him back to his activities as tolerated. ?

## 2021-09-13 NOTE — Patient Instructions (Signed)
Thank you for coming to see me today. It was a pleasure. Today we talked about:  ? ?Your ultrasound was very reassuring.  You have some small areas of wear and tear,  but they will improve.  We will give you exercises for your rotator cuff to do 5 times a week.  Avoid other lifting while you are doing them.  This should help your pain. ? ?Please follow-up with [place] in 4-6 weeks. ? ?If you have any questions or concerns, please do not hesitate to call the office at [phone]. ? ?Best,  ? ?Arizona Constable, DO ?[place]  ?

## 2021-09-13 NOTE — Progress Notes (Signed)
[facility]: Attending Note: I have reviewed the chart, discussed wit the Sports Medicine Fellow. I agree with assessment and treatment plan as detailed in the Fellow's note.  

## 2021-09-16 ENCOUNTER — Other Ambulatory Visit: Payer: Self-pay | Admitting: Cardiovascular Disease

## 2021-09-16 ENCOUNTER — Ambulatory Visit (INDEPENDENT_AMBULATORY_CARE_PROVIDER_SITE_OTHER): Payer: Medicare Other

## 2021-09-16 DIAGNOSIS — I480 Paroxysmal atrial fibrillation: Secondary | ICD-10-CM

## 2021-09-16 LAB — CUP PACEART REMOTE DEVICE CHECK
Date Time Interrogation Session: 20230421230522
Implantable Pulse Generator Implant Date: 20211103

## 2021-09-24 ENCOUNTER — Telehealth: Payer: Self-pay

## 2021-09-24 NOTE — Telephone Encounter (Signed)
? ?  Pre-operative Risk Assessment  ?  ?Patient Name: Derrick Compton  ?DOB: 1934/06/26 ?MRN: [id number]  ? ?  ? ?Request for Surgical Clearance   ? ?Procedure:   colonoscopy ? ?Date of Surgery:  Clearance 11/28/21                              ?   ?Surgeon:  Er. Buccini ?Surgeon's Group or Practice Name:  [facility]  ?Phone number:  [phone] ?Fax number:  [phone] ?  ?Type of Clearance Requested:   ?- Medical : eliquis ?  ?Type of Anesthesia:   propofol ?  ?Additional requests/questions:   ? ?Signed, ?Kathreen Devoid   ?09/24/2021, 9:07 AM  ? ?

## 2021-09-24 NOTE — Telephone Encounter (Signed)
Patient with diagnosis of afib on Eliquis for anticoagulation.   ? ?Procedure: colonoscopy ?Date of procedure: 11/28/21 ? ?CHA2DS2-VASc Score = 3  ?This indicates a 3.2% annual risk of stroke. ?The patient's score is based upon: ?CHF History: 0 ?HTN History: 0 ?Diabetes History: 0 ?Stroke History: 0 ?Vascular Disease History: 1 ?Age Score: 2 ?Gender Score: 0 ? ?CrCl 79m/min ?Platelet count 231K ? ?Per office protocol, patient can hold Eliquis for 1-2 days prior to procedure.   ?

## 2021-09-24 NOTE — Telephone Encounter (Signed)
Preoperative team, please contact this patient and set up a phone call appointment for further cardiac evaluation.  Thank you for your help. ? ?Jossie Ng. Lenin Kuhnle NP-C ? ?  ?09/24/2021, 2:13 PM ?[place] ?[address] ?Office [phone] Fax [phone] ? ?

## 2021-09-25 ENCOUNTER — Telehealth: Payer: Self-pay

## 2021-09-25 NOTE — Telephone Encounter (Signed)
Left message for the patient to contact the office.  ? ?Contacted the patients wife and asked her to have the patient contact the office. She voiced understanding.  ?

## 2021-09-25 NOTE — Telephone Encounter (Signed)
?  Patient Consent for Virtual Visit  ? ? ?    ? ?Derrick Compton has provided verbal consent on 09/25/2021 for a virtual visit (video or telephone). ? ? ?CONSENT FOR VIRTUAL VISIT FOR:  Derrick Compton  ?By participating in this virtual visit I agree to the following: ? ?I hereby voluntarily request, consent and authorize [facility] and its employed or contracted physicians, physician assistants, nurse practitioners or other licensed health care professionals (the Practitioner), to provide me with telemedicine health care services (the ?Services") as deemed necessary by the treating Practitioner. I acknowledge and consent to receive the Services by the Practitioner via telemedicine. I understand that the telemedicine visit will involve communicating with the Practitioner through live audiovisual communication technology and the disclosure of certain medical information by electronic transmission. I acknowledge that I have been given the opportunity to request an in-person assessment or other available alternative prior to the telemedicine visit and am voluntarily participating in the telemedicine visit. ? ?I understand that I have the right to withhold or withdraw my consent to the use of telemedicine in the course of my care at any time, without affecting my right to future care or treatment, and that the Practitioner or I may terminate the telemedicine visit at any time. I understand that I have the right to inspect all information obtained and/or recorded in the course of the telemedicine visit and may receive copies of available information for a reasonable fee.  I understand that some of the potential risks of receiving the Services via telemedicine include:  ?Delay or interruption in medical evaluation due to technological equipment failure or disruption; ?Information transmitted may not be sufficient (e.g. poor resolution of images) to allow for appropriate medical decision making by the Practitioner;  and/or  ?In rare instances, security protocols could fail, causing a breach of personal health information. ? ?Furthermore, I acknowledge that it is my responsibility to provide information about my medical history, conditions and care that is complete and accurate to the best of my ability. I acknowledge that Practitioner's advice, recommendations, and/or decision may be based on factors not within their control, such as incomplete or inaccurate data provided by me or distortions of diagnostic images or specimens that may result from electronic transmissions. I understand that the practice of medicine is not an exact science and that Practitioner makes no warranties or guarantees regarding treatment outcomes. I acknowledge that a copy of this consent can be made available to me via my patient portal ([facility]), or I can request a printed copy by calling the office of [facility].   ? ?I understand that my insurance will be billed for this visit.  ? ?I have read or had this consent read to me. ?I understand the contents of this consent, which adequately explains the benefits and risks of the Services being provided via telemedicine.  ?I have been provided ample opportunity to ask questions regarding this consent and the Services and have had my questions answered to my satisfaction. ?I give my informed consent for the services to be provided through the use of telemedicine in my medical care ? ? ? ?

## 2021-09-25 NOTE — Telephone Encounter (Signed)
Spoke with patient and scheduled telehealth appt; med list and consent reviewed.  ?

## 2021-10-02 NOTE — Progress Notes (Signed)
Carelink Summary Report / Loop Recorder 

## 2021-10-05 ENCOUNTER — Other Ambulatory Visit: Payer: Self-pay | Admitting: Cardiovascular Disease

## 2021-10-07 ENCOUNTER — Other Ambulatory Visit: Payer: Self-pay | Admitting: Psychiatry

## 2021-10-07 DIAGNOSIS — F5105 Insomnia due to other mental disorder: Secondary | ICD-10-CM

## 2021-10-07 DIAGNOSIS — F331 Major depressive disorder, recurrent, moderate: Secondary | ICD-10-CM

## 2021-10-07 NOTE — Telephone Encounter (Signed)
Please call to schedule an appt. 6 months past due.  ?

## 2021-10-07 NOTE — Telephone Encounter (Signed)
LVM for pt to schedule.

## 2021-10-11 ENCOUNTER — Ambulatory Visit (INDEPENDENT_AMBULATORY_CARE_PROVIDER_SITE_OTHER): Payer: Medicare Other | Admitting: Family Medicine

## 2021-10-11 VITALS — BP 92/52 | Ht 70.0 in | Wt 151.0 lb

## 2021-10-11 DIAGNOSIS — M25511 Pain in right shoulder: Secondary | ICD-10-CM | POA: Diagnosis present

## 2021-10-11 MED ORDER — METHYLPREDNISOLONE ACETATE 40 MG/ML IJ SUSP
40.0000 mg | Freq: Once | INTRAMUSCULAR | Status: AC
  Administered 2021-10-11: 40 mg via INTRA_ARTICULAR

## 2021-10-11 NOTE — Assessment & Plan Note (Signed)
60% improvement but he is anxious to go ahead and get back to activities so we discussed options and today he elected for corticosteroid injection into the right subacromial bursa.  He tolerated the procedure well.  Continue home exercise program and follow-up 2 to 4 weeks.

## 2021-10-11 NOTE — Progress Notes (Signed)
  Derrick Compton - 87 y.o. male MRN [id number]  Date of birth: [birth year]    SUBJECTIVE:      Chief Complaint:/ HPI:  Follow-up right shoulder rotator cuff syndrome.  We had elected to do conservative therapy with a home exercise program and relative rest from his golf game.  He is about 60% improved.  He is anxious to get better quicker so he get back to golfing.    OBJECTIVE: BP (!) 92/52   Ht '5\' 10"'$  (1.778 m)   Wt 151 lb (68.5 kg)   BMI 21.67 kg/m   Physical Exam:  Vital signs are reviewed. GENERAL: Thin male no acute distress Shoulders: Full range of motion.  Still some pain with resisted supraspinatus testing and extreme range of motion and internal rotation.  PROCEDURE: INJECTION: Patient was given informed consent, signed copy in the chart. Appropriate time out was taken. Area prepped and draped in usual sterile fashion. Ethyl chloride was  used for local anesthesia. A 21 gauge 1 1/2 inch needle was used.. 1 cc of methylprednisolone 40 mg/ml plus 4 cc of 1% lidocaine without epinephrine was injected into the right subacromial bursa using a(n) posterior approach.   The patient tolerated the procedure well. There were no complications. Post procedure instructions were given.   ASSESSMENT & PLAN:  See problem based charting & AVS for pt instructions. No problem-specific Assessment & Plan notes found for this encounter.

## 2021-10-22 ENCOUNTER — Ambulatory Visit (INDEPENDENT_AMBULATORY_CARE_PROVIDER_SITE_OTHER): Payer: Medicare Other

## 2021-10-22 DIAGNOSIS — I480 Paroxysmal atrial fibrillation: Secondary | ICD-10-CM

## 2021-10-22 LAB — CUP PACEART REMOTE DEVICE CHECK
Date Time Interrogation Session: 20230530120100
Implantable Pulse Generator Implant Date: 20211103

## 2021-10-24 ENCOUNTER — Other Ambulatory Visit: Payer: Self-pay | Admitting: Psychiatry

## 2021-10-29 ENCOUNTER — Ambulatory Visit (INDEPENDENT_AMBULATORY_CARE_PROVIDER_SITE_OTHER): Payer: Medicare Other | Admitting: Physician Assistant

## 2021-10-29 DIAGNOSIS — Z01810 Encounter for preprocedural cardiovascular examination: Secondary | ICD-10-CM

## 2021-10-29 DIAGNOSIS — I2510 Atherosclerotic heart disease of native coronary artery without angina pectoris: Secondary | ICD-10-CM

## 2021-10-29 NOTE — Progress Notes (Signed)
Virtual Visit via Telephone Note   Because of Derrick Compton's co-morbid illnesses, he is at least at moderate risk for complications without adequate follow up.  This format is felt to be most appropriate for this patient at this time.  The patient did not have access to video technology/had technical difficulties with video requiring transitioning to audio format only (telephone).  All issues noted in this document were discussed and addressed.  No physical exam could be performed with this format.  Please refer to the patient's chart for his consent to telehealth for [facility].  Evaluation Performed:  Preoperative cardiovascular risk assessment _____________   Date:  10/29/2021   Patient ID:  Derrick Compton, DOB 1935-05-02, MRN [id number] Patient Location:  Home Provider location:   Office  Primary Care Provider:  Alonna [facility], MD Primary Cardiologist:  Quay Burow, MD  Chief Complaint / Patient Profile   87 y.o. y/o male with a h/o PAF s/p ablation, CAD, HLD, aortic stenosis and HLD who is pending colonoscopy and presents today for telephonic preoperative cardiovascular risk assessment.  Past Medical History    Past Medical History:  Diagnosis Date   Anxiety    BPH (benign prostatic hyperplasia)    CAD (coronary artery disease)    Chest pain    Hyperlipidemia    Osteopenia    Paroxysmal atrial fibrillation (HCC)    Past Surgical History:  Procedure Laterality Date   ATRIAL FIBRILLATION ABLATION N/A 11/10/2019   Procedure: ATRIAL FIBRILLATION ABLATION;  Surgeon: [name], MD;  Location: [facility];  Service: Cardiovascular;  Laterality: N/A;   implantable loop recorder placement  03/28/2020    Medtronic Reveal Linq model LNQ 22 (SN [id number] ) implantable loop recorder implanted for afib management   LEFT HEART CATH AND CORONARY ANGIOGRAPHY N/A 02/05/2017   Procedure: LEFT HEART CATH AND CORONARY ANGIOGRAPHY;  Surgeon: Lorretta Harp, MD;   Location: [facility];  Service: Cardiovascular;  Laterality: N/A;    Allergies  No Known Allergies  History of Present Illness    Derrick Compton is a 87 y.o. male who presents via audio/video conferencing for a telehealth visit today.  Pt was last seen in cardiology clinic on 03/2021 by Dr. Gwenlyn Found.  At that time KEIJUAN SCHELLHASE was doing well.  The patient is now pending procedure as outlined above. Since his last visit, he well. Walks 4-5 days/week without any issue. The patient denies nausea, vomiting, fever, chest pain, palpitations, shortness of breath, orthopnea, PND, dizziness, syncope, cough, congestion, abdominal pain, hematochezia, melena, lower extremity edema.    Home Medications    Prior to Admission medications   Medication Sig Start Date End Date Taking? Authorizing Provider  apixaban (ELIQUIS) 5 MG TABS tablet TAKE 1 TABLET BY MOUTH TWICE A DAY 06/17/21   Allred, Jeneen Rinks, MD  DULoxetine (CYMBALTA) 60 MG capsule TAKE 1 CAPSULE EVERY DAY 08/01/21   Cottle, Billey Co., MD  LORazepam (ATIVAN) 0.5 MG tablet Take 1 tablet (0.5 mg total) by mouth every 8 (eight) hours. Patient not taking: Reported on 09/25/2021 10/02/20   Purnell Shoemaker., MD  mirtazapine (REMERON) 30 MG tablet TAKE 1 TABLET BY MOUTH AT BEDTIME. 04/08/21   Cottle, Billey Co., MD  Protein POWD Take 1 Scoop by mouth See admin instructions. Take monday- Friday vega essentials shake    [provider]  REPATHA SURECLICK 194 MG/ML SOAJ INJECT 1 PEN INTO THE SKIN EVERY 14 (FOURTEEN)  DAYS. 10/07/21   Lorretta Harp, MD  rosuvastatin (CRESTOR) 40 MG tablet TAKE 1 TABLET BY MOUTH EVERY DAY 09/16/21   Lorretta Harp, MD    Physical Exam    Vital Signs:  Pierce Crane Fuhs does not have vital signs available for review today.  Given telephonic nature of communication, physical exam is limited. AAOx3. NAD. Normal affect.  Speech and respirations are unlabored.  Accessory Clinical Findings     None  Assessment & Plan    1.  Preoperative Cardiovascular Risk Assessment: Given past medical history and time since last visit, based on ACC/AHA guidelines, [name] would be at acceptable risk for the planned procedure without further cardiovascular testing.   The patient was advised that if he develops new symptoms prior to surgery to contact our office to arrange for a follow-up visit, and he verbalized understanding.  I will route this recommendation to the requesting party via Epic fax function and remove from pre-op pool.  "Per office protocol, patient can hold Eliquis for 1-2 days prior to procedure".  A copy of this note will be routed to requesting surgeon.  Time:   Today, I have spent 6 minutes with the patient with telehealth technology discussing medical history, symptoms, and management plan.     [place], [place]  10/29/2021, 11:03 AM

## 2021-11-01 ENCOUNTER — Ambulatory Visit (INDEPENDENT_AMBULATORY_CARE_PROVIDER_SITE_OTHER): Payer: Medicare Other

## 2021-11-01 DIAGNOSIS — B351 Tinea unguium: Secondary | ICD-10-CM

## 2021-11-01 NOTE — Progress Notes (Signed)
Patient presents today for the 6th laser treatment. Diagnosed with mycotic nail infection by Dr. Cannon Kettle.   Toenail most affected bilateral 1-5. Slight improvement in the nails today.  All other systems are negative.  Nails were filed thin. Laser therapy was administered to bilateral 1-5 toenails  and patient tolerated the treatment well. All safety precautions were in place.    Patient has completed the recommended laser treatments. He will follow up with Dr. Sherryle Lis in 3 months to evaluate progress.

## 2021-11-06 ENCOUNTER — Other Ambulatory Visit: Payer: Self-pay | Admitting: Psychiatry

## 2021-11-06 DIAGNOSIS — F5105 Insomnia due to other mental disorder: Secondary | ICD-10-CM

## 2021-11-06 DIAGNOSIS — F331 Major depressive disorder, recurrent, moderate: Secondary | ICD-10-CM

## 2021-11-06 NOTE — Telephone Encounter (Signed)
Pt LVM at 11:07a.  He would like refill of Mirtazapine sent to   [id number] Phone:  [phone] Fax:  [phone]  Next appt 7/18

## 2021-11-07 ENCOUNTER — Other Ambulatory Visit: Payer: Self-pay | Admitting: Psychiatry

## 2021-11-07 DIAGNOSIS — F411 Generalized anxiety disorder: Secondary | ICD-10-CM

## 2021-11-07 DIAGNOSIS — F4001 Agoraphobia with panic disorder: Secondary | ICD-10-CM

## 2021-11-07 DIAGNOSIS — F331 Major depressive disorder, recurrent, moderate: Secondary | ICD-10-CM

## 2021-11-07 NOTE — Progress Notes (Signed)
Carelink Summary Report / Loop Recorder 

## 2021-11-25 ENCOUNTER — Ambulatory Visit (INDEPENDENT_AMBULATORY_CARE_PROVIDER_SITE_OTHER): Payer: Medicare Other

## 2021-11-25 DIAGNOSIS — I480 Paroxysmal atrial fibrillation: Secondary | ICD-10-CM | POA: Diagnosis not present

## 2021-11-27 LAB — CUP PACEART REMOTE DEVICE CHECK
Date Time Interrogation Session: 20230702231245
Implantable Pulse Generator Implant Date: 20211103

## 2021-11-29 ENCOUNTER — Other Ambulatory Visit: Payer: Self-pay | Admitting: Psychiatry

## 2021-11-29 DIAGNOSIS — F5105 Insomnia due to other mental disorder: Secondary | ICD-10-CM

## 2021-11-29 DIAGNOSIS — F331 Major depressive disorder, recurrent, moderate: Secondary | ICD-10-CM

## 2021-12-10 ENCOUNTER — Encounter: Payer: Self-pay | Admitting: Psychiatry

## 2021-12-10 ENCOUNTER — Ambulatory Visit (INDEPENDENT_AMBULATORY_CARE_PROVIDER_SITE_OTHER): Payer: Medicare Other | Admitting: Psychiatry

## 2021-12-10 DIAGNOSIS — F331 Major depressive disorder, recurrent, moderate: Secondary | ICD-10-CM | POA: Diagnosis not present

## 2021-12-10 DIAGNOSIS — F411 Generalized anxiety disorder: Secondary | ICD-10-CM

## 2021-12-10 DIAGNOSIS — F4001 Agoraphobia with panic disorder: Secondary | ICD-10-CM

## 2021-12-10 DIAGNOSIS — F5105 Insomnia due to other mental disorder: Secondary | ICD-10-CM | POA: Diagnosis not present

## 2021-12-10 MED ORDER — LORAZEPAM 0.5 MG PO TABS: 0.5000 mg | ORAL_TABLET | Freq: Three times a day (TID) | ORAL | 1 refills | Status: DC

## 2021-12-10 MED ORDER — MIRTAZAPINE 30 MG PO TABS: 30.0000 mg | ORAL_TABLET | Freq: Every day | ORAL | 0 refills | Status: DC

## 2021-12-10 MED ORDER — DULOXETINE HCL 60 MG PO CPEP: 60.0000 mg | ORAL_CAPSULE | Freq: Every day | ORAL | 0 refills | Status: DC

## 2021-12-10 MED ORDER — LITHIUM CARBONATE 150 MG PO CAPS: 150.0000 mg | ORAL_CAPSULE | Freq: Every day | ORAL | 0 refills | Status: DC

## 2021-12-10 NOTE — Progress Notes (Signed)
Derrick Compton [id number] Sep 15, 1934 87 y.o.  Subjective:   Patient ID:  Derrick Compton is a 87 y.o. (DOB Feb 18, 1935) male.  Chief Complaint:  Chief Complaint  Patient presents with   Follow-up   Depression   Anxiety    Depression        Associated symptoms include no decreased concentration and no suicidal ideas.  Past medical history includes anxiety.   Anxiety Symptoms include nervous/anxious behavior. Patient reports no confusion, decreased concentration or suicidal ideas.     Derrick Compton presents to the office today for follow-up of TRD  And panic and GAD.   At  visit in December he was depressed.  Started mirtazapine and very low dose lithium.  Awakens with a little depression.  Sleep is pretty good and rarely needs bz.  Depression better after a week or so.  Tolerating meds ok.  Not gaining weight like when on mirtazapine before.   When seen July 28, 2018.  No meds were changed. However at some point he discontinued lithium 150 mg daily.  He stopped bc didn't seem to be doing anything and read about all the side effects.  When seen October 2020 the following observations were made: He has relapsed again and the only change is he stopped lithium.  He doesn't think it helped but I question that. He prefers to increase sertraline over restarting lithium.  Tried to address his fears about lithium. Option increase mirtazapine but may lose sleep benefit. Increase sertraline to 50 mg tabs 3 daily for total of 150 mg daily.  December 2020 appointment the following is noted: More  Groggy with increase sertraline and reduced it back to the original 50 mg daily for about 6 weeks. Went into Mind Stress Management and meditation and it seems to help a good bit.  Thinks a lot of his depression is anxiety over his health fears and the meditation helps to deal with it.  covid didn't help his mood.  Worries over his heart bc he has some issues with it. Can enjoy watching a ball game  and function is better.  Doesn't have to stay so busy to be OK now.  Recognizes he needs to change the way he thinks to live with more peace. Rare alprazolam about 2 times monthly.  Business doing well. Notices calming effect from sertraline.  Fewer speeding tickets. Patient reports stable mood and denies depressed or irritable moods.  Less difficulty with anxiety.  Patient denies difficulty with sleep initiation or maintenance. NO appetite disturbance.  Patient reports that energy and motivation have been good.  Patient denies any difficulty with concentration.  Patient denies any suicidal ideation.  Plan:Continue sertraline 50 Continue mirtazapine 30 HS.  11/22/2019 appointment the following is noted: Not so well.  Afib ablation last Thursday and dosen't feel better. Anxiety is worse esp AM.  Like 2 different people.  Later in day is better. AM don't want to do anything and scared.  Normal by lunch usually. Not taking BZ.  Tried it and gets wiped out. If takes both alprazolam and mirtazapine at night then gets agitated sleep. Sleep good and no panic. Taking mirtazapine 30 and sertraline 100 daily. He doesn't like taking multiple meds. Plan: Do not change AMA. Continue sertraline 100 Continue mirtazapine 30 HS. Restart lithium 150 which helped before. Change alprazolam 0.5 to lorazepam 0.5 prn for better tolerability.  08/09/2020 appointment with the following noted: Didn't take lithium long bc it didn't work. Last 3 mos  doesn't want to get OOB and is anxious but once up and working feels a lot better.  Little things cause more anxiety.  The other 6 mos was pretty good.  Not unhappy but doesn't get excited about anything. No problems with sleep and looks forward to going to bed. Only drinks when goes out and at most 2 and when does the whole world feels great. Work and home life good.  Some worry age related.  No panic.  No SI Plan: He has relapsed again with sx in the morning resolved by  evening. Depression and anxiety is worse. Claims compliance with sertraline. Continue mirtazapine 30 HS. He doesn't want to retry lithium 150 which helped before. continue lorazepam 0.5 prn for better tolerability. Otherwise no option to improve without change in sertraline. Consider duloxetine, Trintellix or Viibryd Generic first duloxetine switch to 60 mg daily. Reduce sertraline to 1/2 tablet daily and add duloxetine 30 mg capsule 1 daily for 1 week, Then stop sertraline and increase duloxetine to 2 of the 30 mg capsules. When that prescription runs out the refill will be for duloxetine 60 mg capsule 1 daily  10/02/2020 appointment with the following noted: Excellent response to duloxetine with resolution of depression, better energy and enjoyment. Better energy and no panic. Patient reports stable mood and denies depressed or irritable moods.  Patient denies any recent difficulty with anxiety.  Patient denies difficulty with sleep initiation or maintenance. Denies appetite disturbance.  Patient reports that energy and motivation have been good.  Patient denies any difficulty with concentration.  Patient denies any suicidal ideation. Plan: Continue mirtazapine 30 HS. duloxetine 60 mg capsule 1 daily  12/10/21 appt noted: Still on duloxetine 60 mg daily and mirtazapine 30 mg HS Run into big problem in Spring not wanting to get up in AM and progressed worse.  Would feel ok in afternoon for awhile and now depressed and don't ikknow  why I'm depressed. [place] a lot to do but some activiity with RV parks. Has to force himself to do something.  Low interest and motivation and energy May stay in bed too long. A little anxiety.  Takes lorazepam to cope and it helps some. No sig alcohol except 2 drinks going out. Still exercises which helps.  No SE.  Past psychiatric medications Wellbutrin 150, citalopram, paroxetine, Deplin,  fluoxetine side effects, sertraline 150   Groggy Duloxetine 60 Mirtazapine 45 drowsy buspirone, Abilify 2 mg, Rexulti 2 mg, lithium 150 Lorazepam, alprazolam 0.5  Review of Systems:  Review of Systems  Constitutional:  Negative for activity change and unexpected weight change.  Neurological:  Negative for tremors.  Psychiatric/Behavioral:  Positive for dysphoric mood. Negative for agitation, behavioral problems, confusion, decreased concentration, hallucinations, self-injury, sleep disturbance and suicidal ideas. The patient is nervous/anxious. The patient is not hyperactive.     Medications: I have reviewed the patient's current medications.  Current Outpatient Medications  Medication Sig Dispense Refill   apixaban (ELIQUIS) 5 MG TABS tablet TAKE 1 TABLET BY MOUTH TWICE A DAY 60 tablet 5   lithium carbonate 150 MG capsule Take 1 capsule (150 mg total) by mouth daily at 12 noon. 90 capsule 0   Protein POWD Take 1 Scoop by mouth See admin instructions. Take monday- Friday vega essentials shake     REPATHA SURECLICK 824 MG/ML SOAJ INJECT 1 PEN INTO THE SKIN EVERY 14 (FOURTEEN) DAYS. 6 mL 3   rosuvastatin (CRESTOR) 40 MG tablet TAKE 1 TABLET BY MOUTH EVERY  DAY 90 tablet 2   DULoxetine (CYMBALTA) 60 MG capsule Take 1 capsule (60 mg total) by mouth daily. 90 capsule 0   LORazepam (ATIVAN) 0.5 MG tablet Take 1 tablet (0.5 mg total) by mouth every 8 (eight) hours. 30 tablet 1   mirtazapine (REMERON) 30 MG tablet Take 1 tablet (30 mg total) by mouth at bedtime. 90 tablet 0   No current facility-administered medications for this visit.    Medication Side Effects: None  Allergies: No Known Allergies  Past Medical History:  Diagnosis Date   Anxiety    BPH (benign prostatic hyperplasia)    CAD (coronary artery disease)    Chest pain    Hyperlipidemia    Osteopenia    Paroxysmal atrial fibrillation ([place])     History reviewed. No pertinent family history.  Social History   Socioeconomic History   Marital status: Married     Spouse name: Not on file   Number of children: Not on file   Years of education: Not on file   Highest education level: Not on file  Occupational History   Not on file  Tobacco Use   Smoking status: Never   Smokeless tobacco: Never  Substance and Sexual Activity   Alcohol use: Yes    Alcohol/week: 2.0 standard drinks of alcohol    Types: 2 Standard drinks or equivalent per week   Drug use: No   Sexual activity: Not on file  Other Topics Concern   Not on file  Social History Narrative   Lives in [place] with wife.      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   Social Determinants of Health   Financial Resource Strain: Not on file  Food Insecurity: Not on file  Transportation Needs: Not on file  Physical Activity: Not on file  Stress: Not on file  Social Connections: Not on file  Intimate Partner Violence: Not on file    Past Medical History, Surgical history, Social history, and Family history were reviewed and updated as appropriate.   Please see review of systems for further details on the patient's review from today.   Objective:   Physical Exam:  There were no vitals taken for this visit.  Physical Exam Constitutional:      General: He is not in acute distress.    Appearance: He is well-developed.  Musculoskeletal:        General: No deformity.  Neurological:     Mental Status: He is alert and oriented to person, place, and time.     Motor: No tremor.     Coordination: Coordination normal.     Gait: Gait normal.  Psychiatric:        Attention and Perception: He is attentive. He does not perceive auditory hallucinations.        Mood and Affect: Mood is anxious and depressed. Affect is not labile, blunt, angry or inappropriate.        Speech: Speech normal.        Behavior: Behavior normal.        Thought Content: Thought content normal. Thought content is not delusional. Thought content does not include homicidal or suicidal  ideation. Thought content does not include suicidal plan.        Cognition and Memory: Cognition normal.        Judgment: Judgment normal.     Comments: Insight and judgment fair. No auditory or visual hallucinations. No delusions.  Lab Review:     Component Value Date/Time   NA 140 11/07/2019 0000   K 4.1 11/07/2019 0000   CL 104 11/07/2019 0000   CO2 29 11/07/2019 0000   GLUCOSE 136 (H) 11/07/2019 0000   GLUCOSE 108 (H) 02/11/2011 0533   BUN 16 11/07/2019 0000   CREATININE 0.88 11/07/2019 0000   CALCIUM 9.4 11/07/2019 0000   PROT 7.0 01/04/2020 0903   ALBUMIN 4.3 01/04/2020 0903   AST 21 01/04/2020 0903   ALT 31 01/04/2020 0903   ALKPHOS 101 01/04/2020 0903   BILITOT 0.5 01/04/2020 0903   GFRNONAA 80 11/07/2019 0000   GFRAA 92 11/07/2019 0000       Component Value Date/Time   WBC 6.0 11/07/2019 0000   WBC 5.6 02/09/2011 0510   RBC 4.36 11/07/2019 0000   RBC 4.34 02/09/2011 0510   HGB 13.4 11/07/2019 0000   HCT 39.8 11/07/2019 0000   PLT 237 11/07/2019 0000   MCV 91 11/07/2019 0000   MCH 30.7 11/07/2019 0000   MCH 31.3 02/09/2011 0510   MCHC 33.7 11/07/2019 0000   MCHC 35.1 02/09/2011 0510   RDW 14.3 11/07/2019 0000   LYMPHSABS 1.2 11/07/2019 0000   MONOABS 0.8 02/05/2011 0340   EOSABS 0.2 11/07/2019 0000   BASOSABS 0.0 11/07/2019 0000   01/31/21 normal CMP, B12, other labs at [facility]  No results found for: "POCLITH", "LITHIUM"   No results found for: "PHENYTOIN", "PHENOBARB", "VALPROATE", "CBMZ"   .res Assessment: Plan:    Major depressive disorder, recurrent episode, moderate (HCC) - Plan: mirtazapine (REMERON) 30 MG tablet, DULoxetine (CYMBALTA) 60 MG capsule, lithium carbonate 150 MG capsule  Generalized anxiety disorder - Plan: LORazepam (ATIVAN) 0.5 MG tablet, DULoxetine (CYMBALTA) 60 MG capsule  Panic disorder with agoraphobia - Plan: LORazepam (ATIVAN) 0.5 MG tablet, DULoxetine (CYMBALTA) 60 MG capsule  Insomnia due to mental  condition - Plan: mirtazapine (REMERON) 30 MG tablet   Hx multiple recurrences of depression some of which were due to to noncompliance with medication..  Many of the recurrences have been due to noncompliance for various reasons.  He is medication sensitive which complicates things.  Failed multiple meds and types of meds. Tried to educate about diurnal pattern of depression. He was well 09/2020 but has relapsed again into classic depression diurnal pattern.  Rec lithium potentiation which helped before and disc low dose and it low SE at low dose.  Not a bipolar dose. He agrees lithium 150 mg HS  Continue mirtazapine 30 HS. Cont lorazepam prn. We discussed the short-term risks associated with benzodiazepines including sedation and increased fall risk among others.  Discussed long-term side effect risk including dependence, potential withdrawal symptoms, and the potential eventual dose-related risk of dementia.  But recent studies from 2020 dispute this association between benzodiazepines and dementia risk. Newer studies in 2020 do not support an association with dementia.  duloxetine 60 mg capsule 1 daily Disc option increase but he experiences poop out with these antidepressants so lithium makes more sense and he agrees  Call if not better by early August.  FU 8 weeks  Lynder Parents, MD, DFAPA   Please see After Visit Summary for patient specific instructions.  Future Appointments  Date Time Provider [place]  12/30/2021 11:35 AM [facility] DEVICE REMOTES [facility]  01/06/2022  9:45 AM Allred, Jeneen Rinks, MD [facility]  02/03/2022 11:35 AM [facility] DEVICE REMOTES [facility]  02/06/2022 10:45 AM Criselda Peaches, DPM [facility]  03/10/2022 11:35 AM  [facility] DEVICE REMOTES [facility]  04/14/2022 11:35 AM [facility] DEVICE REMOTES [facility]  05/20/2022  7:20 AM [facility] DEVICE REMOTES  [facility]    No orders of the defined types were placed in this encounter.     -------------------------------

## 2021-12-19 NOTE — Progress Notes (Signed)
Carelink Summary Report / Loop Recorder 

## 2021-12-21 ENCOUNTER — Other Ambulatory Visit: Payer: Self-pay | Admitting: Internal Medicine

## 2021-12-23 ENCOUNTER — Telehealth: Payer: Self-pay | Admitting: Psychiatry

## 2021-12-23 NOTE — Telephone Encounter (Signed)
LVM to rtc 

## 2021-12-23 NOTE — Telephone Encounter (Signed)
Last appt is 12/10/21. Derrick Compton is having a problem with his Lithium 150 mg. He has been taking one a day and its causing him to have diarrhea and his stomach hurts. Would it help if the dosage was increased? Please call him at [phone]. Pharmacy is:  [id number] Phone:  [phone] Fax:  [phone]

## 2021-12-23 NOTE — Telephone Encounter (Signed)
Prescription refill request for Eliquis received. Indication: PAF Last office visit: 10/29/21  Derrick Pai  MD Scr: 0.82 on 12/10/21 Age: 87 Weight: 71.3kg   Based on above findings Eliquis '5mg'$  twice daily is the appropriate dose.  Refill approved.

## 2021-12-25 NOTE — Telephone Encounter (Signed)
I have tried to reach pt for 2 days and it goes straight to VM

## 2021-12-27 ENCOUNTER — Other Ambulatory Visit: Payer: Self-pay | Admitting: Family

## 2021-12-27 DIAGNOSIS — R918 Other nonspecific abnormal finding of lung field: Secondary | ICD-10-CM

## 2021-12-30 ENCOUNTER — Ambulatory Visit (INDEPENDENT_AMBULATORY_CARE_PROVIDER_SITE_OTHER): Payer: Medicare Other

## 2021-12-30 DIAGNOSIS — I480 Paroxysmal atrial fibrillation: Secondary | ICD-10-CM

## 2021-12-30 LAB — CUP PACEART REMOTE DEVICE CHECK
Date Time Interrogation Session: 20230804231250
Implantable Pulse Generator Implant Date: 20211103

## 2022-01-06 ENCOUNTER — Ambulatory Visit
Admission: RE | Admit: 2022-01-06 | Discharge: 2022-01-06 | Disposition: A | Discharge status: Home / Self Care | Payer: Medicare Other | Source: Ambulatory Visit | Attending: Family | Admitting: Family

## 2022-01-06 ENCOUNTER — Ambulatory Visit: Payer: Medicare Other | Admitting: Internal Medicine

## 2022-01-06 DIAGNOSIS — R918 Other nonspecific abnormal finding of lung field: Secondary | ICD-10-CM

## 2022-01-08 ENCOUNTER — Encounter: Payer: Self-pay | Admitting: Cardiovascular Disease

## 2022-01-10 ENCOUNTER — Ambulatory Visit (INDEPENDENT_AMBULATORY_CARE_PROVIDER_SITE_OTHER): Payer: Medicare Other | Admitting: Physician Assistant

## 2022-01-10 VITALS — BP 104/66 | HR 75 | Ht 70.0 in | Wt 154.2 lb

## 2022-01-10 DIAGNOSIS — R072 Precordial pain: Secondary | ICD-10-CM | POA: Diagnosis not present

## 2022-01-10 DIAGNOSIS — I2510 Atherosclerotic heart disease of native coronary artery without angina pectoris: Secondary | ICD-10-CM

## 2022-01-10 DIAGNOSIS — E785 Hyperlipidemia, unspecified: Secondary | ICD-10-CM

## 2022-01-10 DIAGNOSIS — R0609 Other forms of dyspnea: Secondary | ICD-10-CM | POA: Diagnosis not present

## 2022-01-10 DIAGNOSIS — I480 Paroxysmal atrial fibrillation: Secondary | ICD-10-CM | POA: Diagnosis not present

## 2022-01-10 NOTE — Patient Instructions (Signed)
Medication Instructions:  Your physician recommends that you continue on your current medications as directed. Please refer to the Current Medication list given to you today.  *If you need a refill on your cardiac medications before your next appointment, please call your pharmacy*  Lab Work: NONE ordered at this time of appointment   If you have labs (blood work) drawn today and your tests are completely normal, you will receive your results only by: [place] (if you have MyChart) OR A paper copy in the mail If you have any lab test that is abnormal or we need to change your treatment, we will call you to review the results.  Testing/Procedures: CARDIAC PET- Your physician has requested that you have a Cardiac Pet Stress Test. This testing is completed at [facility] ([place], [place] [address]). The schedulers will call you to get this scheduled. Please follow instructions below and call the office with any questions/concerns [phone]).  Follow-Up: At [facility], you and your health needs are our priority.  As part of our continuing mission to provide you with exceptional heart care, we have created designated Provider Care Teams.  These Care Teams include your primary Cardiologist (physician) and Advanced Practice Providers (APPs -  Physician Assistants and Nurse Practitioners) who all work together to provide you with the care you need, when you need it.  Your next appointment:   4-5 month(s)  The format for your next appointment:   In Person  Provider:   Quay Burow, MD     Other Instructions  How to Prepare for Your Cardiac PET/CT Stress Test:  1. Please do not take these medications before your test:   Medications that may interfere with the cardiac pharmacological stress agent (ex. nitrates - including erectile dysfunction medications or beta-blockers) the day of the exam. (Erectile dysfunction medication should be  held for at least 72 hrs prior to test) Theophylline containing medications for 12 hours. Dipyridamole 48 hours prior to the test. Your remaining medications may be taken with water.  2. Nothing to eat or drink, except water, 3 hours prior to arrival time.   NO caffeine/decaffeinated products, or chocolate 12 hours prior to arrival.  3. NO perfume, cologne or lotion  4. Total time is 1 to 2 hours; you may want to bring reading material for the waiting time.  5. Please report to Admitting at the [facility] Main Entrance 60 minutes early for your test.  [place], [place] [address]  Diabetic Preparation:  Hold oral medications. You may take NPH and Lantus insulin. Do not take Humalog or Humulin R (Regular Insulin) the day of your test. Check blood sugars prior to leaving the house. If able to eat breakfast prior to 3 hour fasting, you may take all medications, including your insulin, Do not worry if you miss your breakfast dose of insulin - start at your next meal.  IF YOU THINK YOU MAY BE PREGNANT, OR ARE NURSING PLEASE INFORM THE TECHNOLOGIST.  In preparation for your appointment, medication and supplies will be purchased.  Appointment availability is limited, so if you need to cancel or reschedule, please call the Radiology Department at [phone]  24 hours in advance to avoid a cancellation fee of $100.00  What to Expect After you Arrive:  Once you arrive and check in for your appointment, you will be taken to a preparation room within the Radiology Department.  A technologist or Nurse will  obtain your medical history, verify that you are correctly prepped for the exam, and explain the procedure.  Afterwards,  an IV will be started in your arm and electrodes will be placed on your skin for EKG monitoring during the stress portion of the exam. Then you will be escorted to the PET/CT scanner.  There, staff will get you positioned on the scanner and obtain a  blood pressure and EKG.  During the exam, you will continue to be connected to the EKG and blood pressure machines.  A small, safe amount of a radioactive tracer will be injected in your IV to obtain a series of pictures of your heart along with an injection of a stress agent.    After your Exam:  It is recommended that you eat a meal and drink a caffeinated beverage to counter act any effects of the stress agent.  Drink plenty of fluids for the remainder of the day and urinate frequently for the first couple of hours after the exam.  Your doctor will inform you of your test results within 7-10 business days.  For questions about your test or how to prepare for your test, please call: Marchia Bond, Cardiac Imaging Nurse Navigator  Gordy Clement, Cardiac Imaging Nurse Navigator Office: [phone]   Important Information About Sugar

## 2022-01-10 NOTE — Progress Notes (Unsigned)
Cardiology Office Note:    Date:  01/12/2022   ID:  Compton, Derrick [birth year], MRN [id number]  PCP:  Alonna Buckler, [place] [facility] Cardiologist:  Quay Burow, MD     Referring MD: Alonna Buckler, MD   Chief Complaint  Patient presents with   Chest Pain   Shortness of Breath    History of Present Illness:    Derrick Compton is a 87 y.o. male with a hx of CAD, hyperlipidemia, and PAF.  He had a calcium score test in May 2017 that showed calcium score of 181, subsequent GXT was normal.  Nuclear stress test performed on 11/11/2016 was also normal.  Echocardiogram obtained in July 2018 showed mild aortic stenosis.  Coronary CT obtained on 12/24/2016 showed significant lesion in the diagonal vessel.  He underwent cardiac catheterization on 02/05/2017 that revealed 90% ostial D1 lesion, however the location of the lesion make a high risk for stenting.  Patient developed atrial fibrillation on event monitor in January 2021 and was seen by Dr. Rayann Heman who placed him on Eliquis.  Repeat coronary CT obtained in April 2021 revealed coronary calcium score of 217 without significant CAD.  Due to worsening dyspnea, he eventually underwent A-fib ablation by Dr. Rayann Heman on 11/10/2019.  He has a loop recorder placed.  He was last seen by Dr. Alvester Chou on 04/12/2021 at which time he was doing well and was exercising on the treadmill and lifting weights.  Echocardiogram was repeated on 05/14/2021 which showed EF 50 to 55%, grade 1 DD, dilated ascending aorta measuring 42 mm.  Patient has also been followed by [facility] clinic in [place] for pulmonary nodule.  This CT of the chest showed right upper lobe 2.1 cm nodule.  PET CT obtained in November 2021 showed a 1.3 cm right upper lobe nodule that is decreasing in size, small focus of mildly increased FDG uptake in the left hilum likely reactive lymph node, given this decrease in size of the lung nodule, infection is favored over neoplasm.   Nuclear stress test obtained in November 2022 at [facility] showed EF 49%, normal perfusion.  He was seen in January 2023 by pulmonology service who felt the nodule was related to recurrent infection.  He has been seen by [facility] internal medicine service in March 2023 for dyspnea on exertion, his symptom was felt to be multifactorial due to underlying CAD, unclear pulmonary process and deconditioning.  Patient was established with [place] pulmonology service on 12/30/2021 for dyspnea on exertion.  PFT obtained on the same day showed FEV1 120%, FVC 106%.  [place] pulmonology service did not feel his lung nodule would contribute to his dyspnea on exertion, nor did they recommend further work-up for ongoing routine surveillance.  He was started on a trial of SABA.  Patient presents today for follow-up.  His recent coronary CT obtained on 01/06/2022 showed 4 cm ascending thoracic aortic aneurysm, patient was quite disturbed by this as none of the previous CT ever mentioned thoracic aortic aneurysm.  We plan to follow-up on this on a yearly basis.  He has also been having worsening dyspnea on exertion and mild chest discomfort with heavy exercise for the past 3 months. For his symptom, I recommended echocardiogram and coronary CT.  His blood pressure is borderline low at 104/66 today, I do not think he can tolerate a full 100 mg metoprolol, I will give him 25 mg metoprolol to take in the morning of the coronary CT.  Addendum: I decided to switch the coronary CT to a PET stress test as patient has frequent PACs and PVCs.  Past Medical History:  Diagnosis Date   Anxiety    BPH (benign prostatic hyperplasia)    CAD (coronary artery disease)    Chest pain    Hyperlipidemia    Osteopenia    Paroxysmal atrial fibrillation (HCC)     Past Surgical History:  Procedure Laterality Date   ATRIAL FIBRILLATION ABLATION N/A 11/10/2019   Procedure: ATRIAL FIBRILLATION ABLATION;  Surgeon: [name], MD;  Location: [facility];  Service: Cardiovascular;  Laterality: N/A;   implantable loop recorder placement  03/28/2020    Medtronic Reveal Linq model LNQ 22 (SN [id number] ) implantable loop recorder implanted for afib management   LEFT HEART CATH AND CORONARY ANGIOGRAPHY N/A 02/05/2017   Procedure: LEFT HEART CATH AND CORONARY ANGIOGRAPHY;  Surgeon: Lorretta Harp, MD;  Location: [facility];  Service: Cardiovascular;  Laterality: N/A;    Current Medications: Current Meds  Medication Sig   DULoxetine (CYMBALTA) 60 MG capsule Take 1 capsule (60 mg total) by mouth daily.   ELIQUIS 5 MG TABS tablet TAKE 1 TABLET BY MOUTH TWICE A DAY   LORazepam (ATIVAN) 0.5 MG tablet Take 1 tablet (0.5 mg total) by mouth every 8 (eight) hours.   mirtazapine (REMERON) 30 MG tablet Take 1 tablet (30 mg total) by mouth at bedtime.   Protein POWD Take 1 Scoop by mouth See admin instructions. Take monday- Friday vega essentials shake   REPATHA SURECLICK 016 MG/ML SOAJ INJECT 1 PEN INTO THE SKIN EVERY 14 (FOURTEEN) DAYS.   rosuvastatin (CRESTOR) 40 MG tablet TAKE 1 TABLET BY MOUTH EVERY DAY     Allergies:   Patient has no known allergies.   Social History   Socioeconomic History   Marital status: Married    Spouse name: Not on file   Number of children: Not on file   Years of education: Not on file   Highest education level: Not on file  Occupational History   Not on file  Tobacco Use   Smoking status: Never   Smokeless tobacco: Never  Substance and Sexual Activity   Alcohol use: Yes    Alcohol/week: 2.0 standard drinks of alcohol    Types: 2 Standard drinks or equivalent per week   Drug use: No   Sexual activity: Not on file  Other Topics Concern   Not on file  Social History Narrative   Lives in [place] with wife.      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   Social Determinants of Health   Financial Resource Strain: Not on file  Food Insecurity: Not on file   Transportation Needs: Not on file  Physical Activity: Not on file  Stress: Not on file  Social Connections: Not on file     Family History: The patient's family history is not on file.  ROS:   Please see the history of present illness.     All other systems reviewed and are negative.  EKGs/Labs/Other Studies Reviewed:    The following studies were reviewed today:  Cath 02/05/2017 Ost 1st Diag lesion, 95 %stenosed. The left ventricular systolic function is normal. LV end diastolic pressure is normal. The left ventricular ejection fraction is 55-65% by visual estimate.  Echo 05/14/2021  1. Left ventricular ejection fraction, by estimation, is 50 to 55%. The  left ventricle has low normal function.  The left ventricle has no regional  wall motion abnormalities. Left ventricular diastolic parameters are  consistent with Grade I diastolic  dysfunction (impaired relaxation).   2. Right ventricular systolic function is normal. The right ventricular  size is mildly enlarged.   3. Left atrial size was mildly dilated.   4. The mitral valve is normal in structure. No evidence of mitral valve  regurgitation. No evidence of mitral stenosis.   5. The aortic valve is tricuspid. There is moderate calcification of the  aortic valve. There is moderate thickening of the aortic valve. Aortic  valve regurgitation is not visualized. Aortic valve  sclerosis/calcification is present, without any evidence  of aortic stenosis. Aortic valve area, by VTI measures 2.76 cm. Aortic  valve mean gradient measures 5.2 mmHg. Aortic valve Vmax measures 1.58  m/s.   6. Aortic dilatation noted. There is mild dilatation of the ascending  aorta, measuring 42 mm.   7. The inferior vena cava is normal in size with greater than 50%  respiratory variability, suggesting right atrial pressure of 3 mmHg.   Comparison(s): Prior images reviewed side by side.     EKG:  EKG is ordered today.  The ekg ordered today  demonstrates normal sinus rhythm with frequent PACs and PVCs.  Recent Labs: No results found for requested labs within last 365 days.  Recent Lipid Panel    Component Value Date/Time   CHOL 101 03/30/2020 0901   TRIG 74 03/30/2020 0901   HDL 50 03/30/2020 0901   CHOLHDL 2.0 03/30/2020 0901   CHOLHDL 3.1 02/04/2011 1509   VLDL 20 02/04/2011 1509   LDLCALC 36 03/30/2020 0901     Risk Assessment/Calculations:    CHA2DS2-VASc Score = 3   This indicates a 3.2% annual risk of stroke. The patient's score is based upon: CHF History: 0 HTN History: 0 Diabetes History: 0 Stroke History: 0 Vascular Disease History: 1 Age Score: 2 Gender Score: 0          Physical Exam:    VS:  BP 104/66   Pulse 75   Ht '5\' 10"'$  (1.778 m)   Wt 154 lb 3.2 oz (69.9 kg)   SpO2 96%   BMI 22.13 kg/m        Wt Readings from Last 3 Encounters:  01/10/22 154 lb 3.2 oz (69.9 kg)  10/11/21 151 lb (68.5 kg)  09/13/21 150 lb (68 kg)     GEN:  Well nourished, well developed in no acute distress HEENT: Normal NECK: No JVD; No carotid bruits LYMPHATICS: No lymphadenopathy CARDIAC: RRR, no murmurs, rubs, gallops RESPIRATORY:  Clear to auscultation without rales, wheezing or rhonchi  ABDOMEN: Soft, non-tender, non-distended MUSCULOSKELETAL:  No edema; No deformity  SKIN: Warm and dry NEUROLOGIC:  Alert and oriented x 3 PSYCHIATRIC:  Normal affect   ASSESSMENT:    1. Precordial pain   2. DOE (dyspnea on exertion)   3. Coronary artery disease involving native coronary artery of native heart without angina pectoris   4. PAF (paroxysmal atrial fibrillation) ([place])   5. Hyperlipidemia LDL goal <70    PLAN:    In order of problems listed above:  Precordial chest pain: Symptom occurs more so with physical activity.  Patient had a previous cardiac catheterization on 02/05/2017 that showed 95% ostial D1, this was managed medically as the location of the lesion makes stenting very difficult.  We will  obtain PET stress test.  Patient does have frequent PACs and occasional PVCs which makes  repeat coronary CT very difficult  Dyspnea on exertion: Echocardiogram  CAD: See #1.  Continue Repatha and Crestor  PAF: On Eliquis  Hyperlipidemia: On Repatha and Crestor      Shared Decision Making/Informed Consent The risks [chest pain, shortness of breath, cardiac arrhythmias, dizziness, blood pressure fluctuations, myocardial infarction, stroke/transient ischemic attack, nausea, vomiting, allergic reaction, radiation exposure, metallic taste sensation and life-threatening complications (estimated to be 1 in 10,000)], benefits (risk stratification, diagnosing coronary artery disease, treatment guidance) and alternatives of a cardiac PET stress test were discussed in detail with Derrick Compton and he agrees to proceed.    Medication Adjustments/Labs and Tests Ordered: Current medicines are reviewed at length with the patient today.  Concerns regarding medicines are outlined above.  Orders Placed This Encounter  Procedures   NM PET CT CARDIAC PERFUSION MULTI W/ABSOLUTE BLOODFLOW   Cardiac Stress Test: Informed Consent Details: Physician/Practitioner Attestation; Transcribe to consent form and obtain patient signature   EKG 12-Lead   No orders of the defined types were placed in this encounter.   Patient Instructions  Medication Instructions:  Your physician recommends that you continue on your current medications as directed. Please refer to the Current Medication list given to you today.  *If you need a refill on your cardiac medications before your next appointment, please call your pharmacy*  Lab Work: NONE ordered at this time of appointment   If you have labs (blood work) drawn today and your tests are completely normal, you will receive your results only by: [place] (if you have MyChart) OR A paper copy in the mail If you have any lab test that is abnormal or we need to change  your treatment, we will call you to review the results.  Testing/Procedures: CARDIAC PET- Your physician has requested that you have a Cardiac Pet Stress Test. This testing is completed at [facility] ([place], [place] [address]). The schedulers will call you to get this scheduled. Please follow instructions below and call the office with any questions/concerns [phone]).  Follow-Up: At [facility], you and your health needs are our priority.  As part of our continuing mission to provide you with exceptional heart care, we have created designated Provider Care Teams.  These Care Teams include your primary Cardiologist (physician) and Advanced Practice Providers (APPs -  Physician Assistants and Nurse Practitioners) who all work together to provide you with the care you need, when you need it.  Your next appointment:   4-5 month(s)  The format for your next appointment:   In Person  Provider:   Quay Burow, MD     Other Instructions  How to Prepare for Your Cardiac PET/CT Stress Test:  1. Please do not take these medications before your test:   Medications that may interfere with the cardiac pharmacological stress agent (ex. nitrates - including erectile dysfunction medications or beta-blockers) the day of the exam. (Erectile dysfunction medication should be held for at least 72 hrs prior to test) Theophylline containing medications for 12 hours. Dipyridamole 48 hours prior to the test. Your remaining medications may be taken with water.  2. Nothing to eat or drink, except water, 3 hours prior to arrival time.   NO caffeine/decaffeinated products, or chocolate 12 hours prior to arrival.  3. NO perfume, cologne or lotion  4. Total time is 1 to 2 hours; you may want to bring reading material for the waiting time.  5. Please report to Admitting at the  [facility] Main Entrance 60 minutes early for your test.  [place], [place] [address]  Diabetic Preparation:  Hold oral medications. You may take NPH and Lantus insulin. Do not take Humalog or Humulin R (Regular Insulin) the day of your test. Check blood sugars prior to leaving the house. If able to eat breakfast prior to 3 hour fasting, you may take all medications, including your insulin, Do not worry if you miss your breakfast dose of insulin - start at your next meal.  IF YOU THINK YOU MAY BE PREGNANT, OR ARE NURSING PLEASE INFORM THE TECHNOLOGIST.  In preparation for your appointment, medication and supplies will be purchased.  Appointment availability is limited, so if you need to cancel or reschedule, please call the Radiology Department at [phone]  24 hours in advance to avoid a cancellation fee of $100.00  What to Expect After you Arrive:  Once you arrive and check in for your appointment, you will be taken to a preparation room within the Radiology Department.  A technologist or Nurse will obtain your medical history, verify that you are correctly prepped for the exam, and explain the procedure.  Afterwards,  an IV will be started in your arm and electrodes will be placed on your skin for EKG monitoring during the stress portion of the exam. Then you will be escorted to the PET/CT scanner.  There, staff will get you positioned on the scanner and obtain a blood pressure and EKG.  During the exam, you will continue to be connected to the EKG and blood pressure machines.  A small, safe amount of a radioactive tracer will be injected in your IV to obtain a series of pictures of your heart along with an injection of a stress agent.    After your Exam:  It is recommended that you eat a meal and drink a caffeinated beverage to counter act any effects of the stress agent.  Drink plenty of fluids for the remainder of the day and urinate frequently for the first couple of hours after the exam.  Your doctor will inform you of your test results within  7-10 business days.  For questions about your test or how to prepare for your test, please call: Marchia Bond, Cardiac Imaging Nurse Navigator  Gordy Clement, Cardiac Imaging Nurse Navigator Office: [phone]   Important Information About Sugar         Hilbert Corrigan, [place]  01/12/2022 10:58 PM    [place]

## 2022-01-11 NOTE — Progress Notes (Unsigned)
Cardiology Office Note Date:  01/13/2022  Patient ID:  Kwamane, Whack 1935-01-31, MRN [id number] PCP:  Alonna Buckler, MD  Cardiologist:  Dr. Gwenlyn Found Electrophysiologist: Dr. Rayann Heman     Chief Complaint:  annual visit  History of Present Illness: CAROLOS FECHER is a 87 y.o. male with history of CAD, AFib   He comes in today to be seen for Dr. Rayann Heman, last seen by him Aug 2022, at that time, pending a visit with Dr. Gwenlyn Found to discussed new symptoms of exertional CP/SOB Afib burden was zero, no changes were made. Did discussed her symptoms, pending her visit with Dr. Gwenlyn Found, with baseline bradycardia, BB was not started, rates on her loop though reasonable and no pacer indicated/needed.  Saw Dr. Gwenlyn Found Nov 2022, no significant CP/symptoms reported, apparently exercising regularly and feeling well.  Most recently saw H. Eulas Post, PA-C 01/09/22 to f/u on a new ascending ao aneurysm, discussed annual CTs recommended.  With some degree of CP/DOE, planned for PET stress and an echo.  TODAY He is concerned about his watch HR data, very random and rapidly change HRs from 30's-190's, both only moments in duration, but tends to see rates all over the place Reports that this goes back a few years, probably 3.  He has been seen for this bot here and at [facility] apparently with recommendations to do nothing differently When his HR reads in the 30's he has a vague sense of awareness of this. He reports remaining quite active, mentions he was running marathons in his 73's and unable to do that any longer, wishes he could, says this is 2/2 lack of stamina/energy over the years, but still exercising regularly otherwise. He does report CP, not predictable, and discussed with Isaac Laud at his last visit, no changes since that visit No near syncope or syncope. No bleeding or signs of bleeding  He remains worried about his CT and why an aortic aneurysm had not been noted before now, measure 4.0cm. (CT done to follow  pulmonary findings, now seeing pulm, Dr. Ok Anis at [facility])    [place] hx Diagnosed Jan 2021 Has sought second opinion at [facility] clinic  PVI ablation 11/10/2019, Dr. Joette Catching Dr. Quentin Ore Sept 2021 for watchman evaluation, pt wanting to come off a/c secondary to very active lifestyle, hiking/race care activities (?), felt to be a candidate for the procedure, pt was going to f/u with Dr. Rayann Heman and make a decision ILR implanted 03/28/2020  I do not find any AAD hx  Device  MDT LINQ II implanted 03/28/2020 for Afib surveillance   Past Medical History:  Diagnosis Date   Anxiety    BPH (benign prostatic hyperplasia)    CAD (coronary artery disease)    Chest pain    Hyperlipidemia    Osteopenia    Paroxysmal atrial fibrillation ([place])     Past Surgical History:  Procedure Laterality Date   ATRIAL FIBRILLATION ABLATION N/A 11/10/2019   Procedure: ATRIAL FIBRILLATION ABLATION;  Surgeon: [name], MD;  Location: [place] [facility];  Service: Cardiovascular;  Laterality: N/A;   implantable loop recorder placement  03/28/2020    Medtronic Reveal Linq model LNQ 22 (SN [id number] ) implantable loop recorder implanted for afib management   LEFT HEART CATH AND CORONARY ANGIOGRAPHY N/A 02/05/2017   Procedure: LEFT HEART CATH AND CORONARY ANGIOGRAPHY;  Surgeon: Lorretta Harp, MD;  Location: [place] [facility];  Service: Cardiovascular;  Laterality: N/A;    Current Outpatient Medications  Medication Sig Dispense Refill   DULoxetine (CYMBALTA) 60 MG capsule Take 1 capsule (60 mg total) by mouth daily. 90 capsule 0   ELIQUIS 5 MG TABS tablet TAKE 1 TABLET BY MOUTH TWICE A DAY 60 tablet 5   LORazepam (ATIVAN) 0.5 MG tablet Take 0.5 mg by mouth every 30 (thirty) days.     mirtazapine (REMERON) 30 MG tablet Take 1 tablet (30 mg total) by mouth at bedtime. 90 tablet 0   Protein POWD Take 1 Scoop by mouth See admin instructions. Take monday- Friday vega essentials shake     REPATHA  SURECLICK 188 MG/ML SOAJ INJECT 1 PEN INTO THE SKIN EVERY 14 (FOURTEEN) DAYS. 6 mL 3   rosuvastatin (CRESTOR) 40 MG tablet TAKE 1 TABLET BY MOUTH EVERY DAY 90 tablet 2   No current facility-administered medications for this visit.    Allergies:   Patient has no known allergies.   Social History:  The patient  reports that he has never smoked. He has never used smokeless tobacco. He reports current alcohol use of about 2.0 standard drinks of alcohol per week. He reports that he does not use drugs.   Family History:  The patient's family history is not on file.  ROS:  Please see the history of present illness.    All other systems are reviewed and otherwise negative.   PHYSICAL EXAM:  VS:  BP (!) 96/50   Pulse 74   Ht '5\' 10"'$  (1.778 m)   Wt 153 lb 12.8 oz (69.8 kg)   SpO2 96%   BMI 22.07 kg/m  BMI: Body mass index is 22.07 kg/m. Well nourished, well developed, in no acute distress HEENT: normocephalic, atraumatic Neck: no JVD, carotid bruits or masses Cardiac:   regularly irregular in a bigeminal pattern, no significant murmurs, no rubs, or gallops Lungs:  CTA b/l, no wheezing, rhonchi or rales Abd: soft, nontender MS: no deformity, age appropriate atrophy Ext:  no edema Skin: warm and dry, no rash Neuro:  No gross deficits appreciated Psych: euthymic mood, full affect  ILR site is stable, no tethering or discomfort   EKG:  SR, 74bpm, atrial bigeminy, some ectopic beats are a wider complex, though still proceeded by P wave  Device interrogation done today and reviewed by myself:  Battery is good R waves 0.43m Available EGMs are all reviewed False AFib for PACs No true Afib is noted  03/14/2021: TTE  1. Left ventricular ejection fraction, by estimation, is 50 to 55%. The  left ventricle has low normal function. The left ventricle has no regional  wall motion abnormalities. Left ventricular diastolic parameters are  consistent with Grade I diastolic  dysfunction  (impaired relaxation).   2. Right ventricular systolic function is normal. The right ventricular  size is mildly enlarged.   3. Left atrial size was mildly dilated.   4. The mitral valve is normal in structure. No evidence of mitral valve  regurgitation. No evidence of mitral stenosis.   5. The aortic valve is tricuspid. There is moderate calcification of the  aortic valve. There is moderate thickening of the aortic valve. Aortic  valve regurgitation is not visualized. Aortic valve  sclerosis/calcification is present, without any evidence  of aortic stenosis. Aortic valve area, by VTI measures 2.76 cm. Aortic  valve mean gradient measures 5.2 mmHg. Aortic valve Vmax measures 1.58  m/s.   6. Aortic dilatation noted. There is mild dilatation of the ascending  aorta, measuring 42 mm.   7. The  inferior vena cava is normal in size with greater than 50%  respiratory variability, suggesting right atrial pressure of 3 mmHg.   Comparison(s): Prior images reviewed side by side.   12/10/2019: EPS/ablation CONCLUSIONS: 1. Sinus rhythm upon presentation.   2. Intracardiac echo reveals a moderate sized left atrium with four separate pulmonary veins without evidence of pulmonary vein stenosis. 3. Successful electrical isolation and anatomical encircling of all four pulmonary veins with radiofrequency current. 4. No inducible arrhythmias following ablation both on and off of Isuprel 5. No early apparent complications.  02/05/2017: LHC Ost 1st Diag lesion, 95 %stenosed. The left ventricular systolic function is normal. LV end diastolic pressure is normal. The left ventricular ejection fraction is 55-65% by visual estimate. IMPRESSION:Mr. Stofko has a high-grade ostial moderate size first diagonal branch stenosis. It is not ideal for stenting. The vessel comes off the 90 angle. He could potentially undergo cutting balloon atherectomy however given the relatively mild nature of his symptoms, the fact  that he is on no antianginals and his Myoview is negative I'm going to initially treat him medically. Should he feel optimal medical antianginal therapy I will put him back in intravenous first diagonal branch ostial lesion. The sheath was removed and a TR band was placed on the right wrist to achieve patent hemostasis. The patient left the lab in stable condition. I am going to begin  Dual antiplatelet therapy and we'll see him back in one week for follow-up.   Recent Labs: No results found for requested labs within last 365 days.  No results found for requested labs within last 365 days.   CrCl cannot be calculated (Patient's most recent lab result is older than the maximum 21 days allowed.).   Wt Readings from Last 3 Encounters:  01/13/22 153 lb 12.8 oz (69.8 kg)  01/10/22 154 lb 3.2 oz (69.9 kg)  10/11/21 151 lb (68.5 kg)     Other studies reviewed: Additional studies/records reviewed today include: summarized above  ASSESSMENT AND PLAN:  Paroxysmal Afib CHA2DS2Vasc is 3, on eliquis, appropriately dosed for weight and Creat (care everywhere labs) zero % burden  Baseline SB HR histograms Brady, pause detections turned on No HRs reaching 40 or 120 I think his watch is getting fooled/seeing/not seeing his ectopy No particular symptoms reported   EKG has had this ectopy for a few years, I think the wider beats are PACs as well, perhaps with some IVCD rate associate. He is getting updated echo soon Discussed potential medications though with reported bradycardia at baseline and potential medication side effects, would not at this time, he would not want any more medicines anyway  CAD W/u in progress via gen cardiology team  I discussed Dr. Jackalyn Lombard last office day, was last week, he anticipate seeing [facility] again for a second opinion on his HR's rhythm, but would like to ramin in our EP service as well, has seen Dr. Quentin Ore in the past.  Disposition: F/u with Will have him see  Dr. Quentin Ore in 66mo sooner if needed  Current medicines are reviewed at length with the patient today.  The patient did not have any concerns regarding medicines.  SVenetia Night PA-C 01/13/2022 3:43 PM     [place] [phone](office)  [phone](fax)

## 2022-01-13 ENCOUNTER — Ambulatory Visit (INDEPENDENT_AMBULATORY_CARE_PROVIDER_SITE_OTHER): Payer: Medicare Other | Admitting: Physician Assistant

## 2022-01-13 ENCOUNTER — Encounter: Payer: Self-pay | Admitting: Physician Assistant

## 2022-01-13 VITALS — BP 96/50 | HR 74 | Ht 70.0 in | Wt 153.8 lb

## 2022-01-13 DIAGNOSIS — I480 Paroxysmal atrial fibrillation: Secondary | ICD-10-CM | POA: Diagnosis not present

## 2022-01-13 DIAGNOSIS — I2510 Atherosclerotic heart disease of native coronary artery without angina pectoris: Secondary | ICD-10-CM | POA: Diagnosis not present

## 2022-01-13 DIAGNOSIS — R001 Bradycardia, unspecified: Secondary | ICD-10-CM | POA: Diagnosis not present

## 2022-01-13 DIAGNOSIS — I491 Atrial premature depolarization: Secondary | ICD-10-CM | POA: Diagnosis not present

## 2022-01-13 DIAGNOSIS — Z4509 Encounter for adjustment and management of other cardiac device: Secondary | ICD-10-CM | POA: Diagnosis not present

## 2022-01-13 NOTE — Patient Instructions (Signed)
Medication Instructions:  No changes *If you need a refill on your cardiac medications before your next appointment, please call your pharmacy*   Lab Work: none   Testing/Procedures: none   Follow-Up: At [facility], you and your health needs are our priority.  As part of our continuing mission to provide you with exceptional heart care, we have created designated Provider Care Teams.  These Care Teams include your primary Cardiologist (physician) and Advanced Practice Providers (APPs -  Physician Assistants and Nurse Practitioners) who all work together to provide you with the care you need, when you need it.   Your next appointment:   6 month(s)  The format for your next appointment:   In Person  Provider:     Important Information About Sugar

## 2022-01-17 ENCOUNTER — Encounter (HOSPITAL_BASED_OUTPATIENT_CLINIC_OR_DEPARTMENT_OTHER): Payer: Self-pay

## 2022-01-21 ENCOUNTER — Ambulatory Visit
Admission: RE | Admit: 2022-01-21 | Discharge: 2022-01-21 | Disposition: A | Discharge status: Home / Self Care | Payer: Medicare Other | Source: Ambulatory Visit | Attending: Sports Medicine | Admitting: Sports Medicine

## 2022-01-21 ENCOUNTER — Ambulatory Visit (INDEPENDENT_AMBULATORY_CARE_PROVIDER_SITE_OTHER): Payer: Medicare Other | Admitting: Sports Medicine

## 2022-01-21 VITALS — BP 102/58 | Ht 70.0 in | Wt 150.0 lb

## 2022-01-21 DIAGNOSIS — I2510 Atherosclerotic heart disease of native coronary artery without angina pectoris: Secondary | ICD-10-CM | POA: Diagnosis not present

## 2022-01-21 DIAGNOSIS — M1611 Unilateral primary osteoarthritis, right hip: Secondary | ICD-10-CM | POA: Diagnosis not present

## 2022-01-21 DIAGNOSIS — M25551 Pain in right hip: Secondary | ICD-10-CM | POA: Diagnosis present

## 2022-01-21 NOTE — Assessment & Plan Note (Signed)
Suspect worsened osteoarthritis.  Given location and current Eliquis use, we are limited in both oral and injectable medications.  We will start with 2 view right hip x-rays to evaluate severity.  Based on x-ray results, will likely recommend PT with sports med clinic follow-up 4 weeks after PT start.

## 2022-01-21 NOTE — Progress Notes (Unsigned)
    SUBJECTIVE:   CHIEF COMPLAINT / HPI:   Right hip pain Derrick Compton is a pleasant 87 year old male who presents today with over 1 month of right hip pain.  He reports the pain is worst when he is getting up from a seated or laying position, improves after walking.  No known previous injuries or surgeries.  Does report a vague history of possible leg length discrepancy, although he has not sought inserts or other therapies for this.  He is able to use his thumb and index finger in a C shape to locate the pain to his lateral right hip, feels "deep".  Does have a CT scan of his hip back in 2021 after a fall that did demonstrate mild osteoarthritis.  Patient does not remember that scan or being told he has osteoarthritis.  He has not been using anything to help the pain.  He reports he is on Eliquis for history of A-fib and thus cannot take NSAIDs or aspirin.  PERTINENT  PMH / PSH: Osteoarthritis, leg length discrepancy, HLD, paroxysmal A-fib, CAD, colon cancer, hearing loss, BPH.  OBJECTIVE:   BP (!) 102/58   Ht '5\' 10"'$  (1.778 m)   Wt 150 lb (68 kg)   BMI 21.52 kg/m    Right hip Inspection and palpation unremarkable, equal to left Tender to palpation over right lateral hip below iliac crest Nontender over lumbar spine, SI joint, or IT band Full range of motion with good internal and external rotation, equal to left Strength of hip and knee 5/5 and equal bilaterally No pain with passive internal rotation or external rotation Negative straight leg raise Mild pain with loading flexed hip at 90 degrees FADIR positive FABER negative  ASSESSMENT/PLAN:   Right hip pain Suspect worsened osteoarthritis.  Given location and current Eliquis use, we are limited in both oral and injectable medications.  We will start with 2 view right hip x-rays to evaluate severity.  Based on x-ray results, will likely recommend PT with sports med clinic follow-up 4 weeks after PT start.     Derrick Essex,  MD  Patient seen and evaluated with the resident.  I agree with the above plan of care.  X-rays do show some degenerative changes in the right hip although it is not yet end-stage.  We will refer the patient for some physical therapy and he will follow-up with me again in 4 weeks.  Given the fact that he is on Eliquis, I will consider referral to Dr. Ninfa Linden to discuss further treatment if symptoms are not improving at that follow-up visit.  This note was dictated using Dragon naturally speaking software and may contain errors in syntax, spelling, or content which have not been identified prior to signing this note.

## 2022-01-22 ENCOUNTER — Encounter: Payer: Self-pay | Admitting: *Deleted

## 2022-01-22 ENCOUNTER — Other Ambulatory Visit: Payer: Self-pay | Admitting: *Deleted

## 2022-01-22 DIAGNOSIS — M25551 Pain in right hip: Secondary | ICD-10-CM

## 2022-01-29 NOTE — Progress Notes (Signed)
Carelink Summary Report / Loop Recorder 

## 2022-02-03 ENCOUNTER — Ambulatory Visit (INDEPENDENT_AMBULATORY_CARE_PROVIDER_SITE_OTHER): Payer: Medicare Other

## 2022-02-03 DIAGNOSIS — I480 Paroxysmal atrial fibrillation: Secondary | ICD-10-CM

## 2022-02-04 LAB — CUP PACEART REMOTE DEVICE CHECK
Date Time Interrogation Session: 20230906231910
Implantable Pulse Generator Implant Date: 20211103

## 2022-02-06 ENCOUNTER — Ambulatory Visit (INDEPENDENT_AMBULATORY_CARE_PROVIDER_SITE_OTHER): Payer: Medicare Other | Admitting: Podiatry

## 2022-02-06 DIAGNOSIS — M79675 Pain in left toe(s): Secondary | ICD-10-CM | POA: Diagnosis not present

## 2022-02-06 DIAGNOSIS — B351 Tinea unguium: Secondary | ICD-10-CM | POA: Diagnosis not present

## 2022-02-06 DIAGNOSIS — M79674 Pain in right toe(s): Secondary | ICD-10-CM | POA: Diagnosis not present

## 2022-02-10 NOTE — Progress Notes (Signed)
  Subjective:  Patient ID: Derrick Compton, male    DOB: [birth year],  MRN: [id number]  Chief Complaint  Patient presents with   Nail Problem    Thick painful toenails, 3 month follow up after laser    87 y.o. male presents with the above complaint. History confirmed with patient.  He was having pain relief with debridement and laser appeared to be improving the appearance of the nail, he took a break from this and notes that it seems to be worsening and would like to restart laser treatment  Objective:  Physical Exam: warm, good capillary refill, no trophic changes or ulcerative lesions, normal DP and PT pulses, and normal sensory exam. Left Foot: dystrophic yellowed discolored nail plates with subungual debris Right Foot: dystrophic yellowed discolored nail plates with subungual debris   Assessment:   1. Pain due to onychomycosis of toenails of both feet      Plan:  Patient was evaluated and treated and all questions answered.   Discussed the etiology and treatment options for the condition in detail with the patient. Educated patient on the topical and oral treatment options for mycotic nails. Recommended debridement of the nails today. Sharp and mechanical debridement performed of all painful and mycotic nails today. Nails debrided in length and thickness using a nail nipper to level of comfort. Discussed treatment options including appropriate shoe gear. Follow up as needed for painful nails.  He was having improvement with laser therapy and would like to restart this.  He will be scheduled for this.  I will see him back at the end of his treatment course    Return in about 1 year (around 02/07/2023) for follow up after nail fungus treatment.

## 2022-02-20 ENCOUNTER — Ambulatory Visit (HOSPITAL_BASED_OUTPATIENT_CLINIC_OR_DEPARTMENT_OTHER): Payer: Medicare Other | Admitting: Physical Therapy

## 2022-02-20 ENCOUNTER — Ambulatory Visit: Payer: Medicare Other | Admitting: Sports Medicine

## 2022-02-21 NOTE — Progress Notes (Signed)
Carelink Summary Report / Loop Recorder 

## 2022-03-08 ENCOUNTER — Other Ambulatory Visit: Payer: Self-pay | Admitting: Psychiatry

## 2022-03-08 DIAGNOSIS — F331 Major depressive disorder, recurrent, moderate: Secondary | ICD-10-CM

## 2022-03-10 ENCOUNTER — Ambulatory Visit (INDEPENDENT_AMBULATORY_CARE_PROVIDER_SITE_OTHER): Payer: Medicare Other

## 2022-03-10 DIAGNOSIS — I480 Paroxysmal atrial fibrillation: Secondary | ICD-10-CM | POA: Diagnosis not present

## 2022-03-10 LAB — CUP PACEART REMOTE DEVICE CHECK
Date Time Interrogation Session: 20231009231457
Implantable Pulse Generator Implant Date: 20211103

## 2022-03-11 NOTE — Telephone Encounter (Signed)
Please schedule appt

## 2022-03-18 NOTE — Telephone Encounter (Signed)
Lvm for pt to call and schedule

## 2022-03-21 ENCOUNTER — Ambulatory Visit (INDEPENDENT_AMBULATORY_CARE_PROVIDER_SITE_OTHER): Payer: Medicare Other

## 2022-03-21 DIAGNOSIS — B351 Tinea unguium: Secondary | ICD-10-CM

## 2022-03-21 NOTE — Progress Notes (Signed)
Patient presents today for the 1st (2nd round) laser treatment. Diagnosed with mycotic nail infection by Dr. Sherryle Lis.   Toenail most affected 1st bilateral.  All other systems are negative.  Nails were filed thin. Laser therapy was administered to 1-5 toenails bilateral and patient tolerated the treatment well. All safety precautions were in place.   Single laser pass was done on non-affected nails.   Follow up in 4 weeks for laser # 2.

## 2022-03-23 ENCOUNTER — Other Ambulatory Visit: Payer: Self-pay | Admitting: Psychiatry

## 2022-03-23 DIAGNOSIS — F411 Generalized anxiety disorder: Secondary | ICD-10-CM

## 2022-03-23 DIAGNOSIS — F4001 Agoraphobia with panic disorder: Secondary | ICD-10-CM

## 2022-03-23 DIAGNOSIS — F331 Major depressive disorder, recurrent, moderate: Secondary | ICD-10-CM

## 2022-03-23 DIAGNOSIS — F5105 Insomnia due to other mental disorder: Secondary | ICD-10-CM

## 2022-03-24 NOTE — Telephone Encounter (Signed)
Please schedule appt

## 2022-03-28 NOTE — Progress Notes (Signed)
Carelink Summary Report / Loop Recorder 

## 2022-04-14 ENCOUNTER — Ambulatory Visit (INDEPENDENT_AMBULATORY_CARE_PROVIDER_SITE_OTHER): Payer: Medicare Other

## 2022-04-14 DIAGNOSIS — I480 Paroxysmal atrial fibrillation: Secondary | ICD-10-CM

## 2022-04-15 LAB — CUP PACEART REMOTE DEVICE CHECK
Date Time Interrogation Session: 20231119231357
Implantable Pulse Generator Implant Date: 20211103

## 2022-04-22 ENCOUNTER — Ambulatory Visit: Payer: Medicare Other

## 2022-04-22 DIAGNOSIS — B351 Tinea unguium: Secondary | ICD-10-CM

## 2022-04-22 NOTE — Progress Notes (Signed)
Patient presents today for the 2nd laser treatment. Diagnosed with mycotic nail infection by Dr. Sherryle Lis .   Toenail most affected 1st bilateral .  All other systems are negative.  Nails were filed thin. Laser therapy was administered to 1-5 toenails bilateral and patient tolerated the treatment well. All safety precautions were in place.   Single laser pass was done on non-affected nails.   Follow up in 4 weeks for laser # 3.

## 2022-04-25 NOTE — Telephone Encounter (Signed)
LVM to call and schedule appt

## 2022-05-01 NOTE — Telephone Encounter (Signed)
Seen 7/18 ok to send?

## 2022-05-06 ENCOUNTER — Other Ambulatory Visit: Payer: Self-pay

## 2022-05-06 DIAGNOSIS — F411 Generalized anxiety disorder: Secondary | ICD-10-CM

## 2022-05-06 DIAGNOSIS — F5105 Insomnia due to other mental disorder: Secondary | ICD-10-CM

## 2022-05-06 DIAGNOSIS — F331 Major depressive disorder, recurrent, moderate: Secondary | ICD-10-CM

## 2022-05-06 DIAGNOSIS — F4001 Agoraphobia with panic disorder: Secondary | ICD-10-CM

## 2022-05-06 MED ORDER — MIRTAZAPINE 30 MG PO TABS: 30.0000 mg | ORAL_TABLET | Freq: Every day | ORAL | 2 refills | Status: DC

## 2022-05-06 MED ORDER — DULOXETINE HCL 60 MG PO CPEP: 60.0000 mg | ORAL_CAPSULE | Freq: Every day | ORAL | 2 refills | Status: DC

## 2022-05-16 ENCOUNTER — Other Ambulatory Visit: Payer: Self-pay | Admitting: Internal Medicine

## 2022-05-16 DIAGNOSIS — R9389 Abnormal findings on diagnostic imaging of other specified body structures: Secondary | ICD-10-CM

## 2022-05-16 DIAGNOSIS — R198 Other specified symptoms and signs involving the digestive system and abdomen: Secondary | ICD-10-CM

## 2022-05-20 ENCOUNTER — Ambulatory Visit (INDEPENDENT_AMBULATORY_CARE_PROVIDER_SITE_OTHER): Payer: Medicare Other

## 2022-05-20 ENCOUNTER — Other Ambulatory Visit: Payer: Medicare Other

## 2022-05-20 DIAGNOSIS — R001 Bradycardia, unspecified: Secondary | ICD-10-CM

## 2022-05-20 LAB — CUP PACEART REMOTE DEVICE CHECK
Date Time Interrogation Session: 20231225231210
Implantable Pulse Generator Implant Date: 20211103

## 2022-05-25 ENCOUNTER — Other Ambulatory Visit: Payer: Self-pay | Admitting: Cardiovascular Disease

## 2022-05-28 NOTE — Progress Notes (Signed)
Carelink Summary Report / Loop Recorder 

## 2022-06-03 ENCOUNTER — Ambulatory Visit
Admission: RE | Admit: 2022-06-03 | Discharge: 2022-06-03 | Disposition: A | Discharge status: Home / Self Care | Payer: Medicare Other | Source: Ambulatory Visit | Attending: Internal Medicine | Admitting: Internal Medicine

## 2022-06-03 DIAGNOSIS — R9389 Abnormal findings on diagnostic imaging of other specified body structures: Secondary | ICD-10-CM

## 2022-06-03 DIAGNOSIS — R198 Other specified symptoms and signs involving the digestive system and abdomen: Secondary | ICD-10-CM

## 2022-06-04 ENCOUNTER — Other Ambulatory Visit (HOSPITAL_COMMUNITY): Payer: Self-pay

## 2022-06-12 NOTE — Progress Notes (Signed)
Carelink Summary Report / Loop Recorder

## 2022-06-23 ENCOUNTER — Ambulatory Visit: Payer: Medicare Other

## 2022-06-23 DIAGNOSIS — I480 Paroxysmal atrial fibrillation: Secondary | ICD-10-CM | POA: Diagnosis not present

## 2022-06-24 LAB — CUP PACEART REMOTE DEVICE CHECK
Date Time Interrogation Session: 20240127230305
Implantable Pulse Generator Implant Date: 20211103

## 2022-07-02 ENCOUNTER — Other Ambulatory Visit: Payer: Self-pay | Admitting: Cardiovascular Disease

## 2022-07-02 NOTE — Telephone Encounter (Signed)
Prescription refill request for Eliquis received. Indication: PAF Last office visit: 01/13/22  R Ursuy PA-C Scr: 0.8 on 04/30/22 Age: 88 Weight: 69.8kg  Based on above findings Eliquis '5mg'$  twice daily is the appropriate dose.  Refill approved.

## 2022-07-03 ENCOUNTER — Encounter (HOSPITAL_COMMUNITY): Payer: Self-pay | Admitting: *Deleted

## 2022-07-11 ENCOUNTER — Telehealth (HOSPITAL_COMMUNITY): Payer: Self-pay | Admitting: Physician Assistant

## 2022-07-11 NOTE — Telephone Encounter (Signed)
I called to schedule PET CT and patient wants to wait to schedule. He will call us back when he decides if he wants to have. Order will be removed from the active [place] and if patient calls back we will reinstate the order. Thank you.

## 2022-07-22 ENCOUNTER — Telehealth (HOSPITAL_COMMUNITY): Payer: Self-pay | Admitting: Emergency Medicine

## 2022-07-22 NOTE — Telephone Encounter (Signed)
Reaching out to patient to offer assistance regarding upcoming cardiac imaging study; pt verbalizes understanding of appt date/time, parking situation and where to check in, pre-test NPO status and medications ordered, and verified current allergies; name and call back number provided for further questions should they arise Derrick Bond RN Navigator Cardiac Imaging [facility] [phone] office [phone] cell  Arrival 1030 [address]  No caffeine 12 hr  No food 3 hr

## 2022-07-23 ENCOUNTER — Encounter (HOSPITAL_COMMUNITY)
Admission: RE | Admit: 2022-07-23 | Discharge: 2022-07-23 | Disposition: A | Discharge status: Home / Self Care | Payer: Medicare Other | Source: Ambulatory Visit | Attending: Physician Assistant | Admitting: Physician Assistant

## 2022-07-23 ENCOUNTER — Encounter (HOSPITAL_COMMUNITY): Payer: Self-pay

## 2022-07-23 DIAGNOSIS — R072 Precordial pain: Secondary | ICD-10-CM | POA: Insufficient documentation

## 2022-07-23 LAB — NM PET CT CARDIAC PERFUSION MULTI W/ABSOLUTE BLOODFLOW
MBFR: 1.97
Nuc Rest EF: 39 %
Nuc Stress EF: 40 %
Peak HR: 69 {beats}/min
Rest HR: 64 {beats}/min
Rest MBF: 0.71 ml/g/min
Rest Nuclear Isotope Dose: 17.7 mCi
ST Depression (mm): 0 mm
Stress MBF: 1.4 ml/g/min
Stress Nuclear Isotope Dose: 17.6 mCi
TID: 0.95

## 2022-07-23 MED ORDER — REGADENOSON 0.4 MG/5ML IV SOLN
0.4000 mg | Freq: Once | INTRAVENOUS | Status: AC
  Administered 2022-07-23: 0.4 mg via INTRAVENOUS

## 2022-07-23 MED ORDER — RUBIDIUM RB82 GENERATOR (RUBYFILL)
25.0000 | PACK | Freq: Once | INTRAVENOUS | Status: AC
  Administered 2022-07-23: 17.64 via INTRAVENOUS

## 2022-07-23 MED ORDER — RUBIDIUM RB82 GENERATOR (RUBYFILL)
25.0000 | PACK | Freq: Once | INTRAVENOUS | Status: AC
  Administered 2022-07-23: 17.7 via INTRAVENOUS

## 2022-07-28 ENCOUNTER — Ambulatory Visit (INDEPENDENT_AMBULATORY_CARE_PROVIDER_SITE_OTHER): Payer: Medicare Other

## 2022-07-28 DIAGNOSIS — I480 Paroxysmal atrial fibrillation: Secondary | ICD-10-CM

## 2022-07-28 LAB — CUP PACEART REMOTE DEVICE CHECK
Date Time Interrogation Session: 20240229231130
Implantable Pulse Generator Implant Date: 20211103

## 2022-07-29 ENCOUNTER — Other Ambulatory Visit (HOSPITAL_COMMUNITY): Payer: Medicare Other

## 2022-07-31 ENCOUNTER — Telehealth: Payer: Self-pay | Admitting: Cardiovascular Disease

## 2022-07-31 NOTE — Telephone Encounter (Signed)
No atrial fibrillation.  Current ECG below appears to show frequent PAC's. Overdue for follow up with EP.

## 2022-07-31 NOTE — Telephone Encounter (Signed)
Called pt. He states it's not chest pain. "I had and ablation done and it just feels like it's not working anymore." The day before yesterday I played golf and could only play for a little while before I started having issues." Pt reports tightness in chest, SOB. The SOB and chest tightness goes away with relaxation." "I'm just wondering is my ablation is not working anymore. This morning I got an alert about a fib from my watch. Last time was a week ago but I did not have any symptoms." My heart rate is 50-70, "more normal", I usually have a low heart rate."

## 2022-07-31 NOTE — Telephone Encounter (Signed)
Spoke with the patient and advised on loop report. Offered patient an appointment with EP APP for follow up. Patient declined at this time. He would like some further input from Dr. Quentin Ore and Dr. Gwenlyn Found. Patient states that he will be fine and all of a sudden will be short of breath. He states it feels similar to when he was having AFIB episodes.

## 2022-07-31 NOTE — Telephone Encounter (Signed)
Patient c/o Palpitations:  High priority if patient c/o lightheadedness, shortness of breath, or chest pain  How long have you had palpitations/irregular HR/ Afib? Are you having the symptoms now? This morning   Are you currently experiencing lightheadedness, SOB or CP? Tightness in chest  Do you have a history of afib (atrial fibrillation) or irregular heart rhythm? yes  Have you checked your BP or HR? (document readings if available): HR 60  Are you experiencing any other symptoms? No

## 2022-08-01 NOTE — Telephone Encounter (Signed)
Spoke with patient informed him that Dr. Quentin Ore would like to give him a symptom activator patient stated he would pick it up on Monday.

## 2022-08-06 NOTE — Progress Notes (Signed)
Carelink Summary Report / Loop Recorder 

## 2022-08-26 NOTE — Addendum Note (Signed)
Addended by: Geni Bers on: 08/26/2022 09:36 AM   Modules accepted: Level of Service

## 2022-08-27 ENCOUNTER — Encounter: Payer: Self-pay | Admitting: Family Medicine

## 2022-08-27 ENCOUNTER — Ambulatory Visit (INDEPENDENT_AMBULATORY_CARE_PROVIDER_SITE_OTHER): Payer: Medicare Other | Admitting: Family Medicine

## 2022-08-27 VITALS — BP 110/66 | Ht 70.0 in | Wt 150.0 lb

## 2022-08-27 DIAGNOSIS — M5416 Radiculopathy, lumbar region: Secondary | ICD-10-CM

## 2022-08-27 LAB — CUP PACEART REMOTE DEVICE CHECK
Date Time Interrogation Session: 20240402231708
Implantable Pulse Generator Implant Date: 20211103

## 2022-08-27 MED ORDER — METHYLPREDNISOLONE ACETATE 40 MG/ML IJ SUSP
40.0000 mg | Freq: Once | INTRAMUSCULAR | Status: AC
  Administered 2022-08-27: 40 mg via INTRAMUSCULAR

## 2022-08-27 NOTE — Progress Notes (Signed)
  Derrick Compton - 88 y.o. male MRN [id number]  Date of birth: 12/10/1934  SUBJECTIVE:  Including CC & ROS.  No chief complaint on file.   Derrick Compton is a 88 y.o. male that is presenting with acute right thigh pain.  The pain has been ongoing for 6 weeks.  No history of surgery in the area.  No injury or inciting event.  Pain is worse with sitting.    Review of Systems See HPI   HISTORY: Past Medical, Surgical, Social, and Family History Reviewed & Updated per EMR.   Pertinent Historical Findings include:  Past Medical History:  Diagnosis Date   Anxiety    BPH (benign prostatic hyperplasia)    CAD (coronary artery disease)    Chest pain    Hyperlipidemia    Osteopenia    Paroxysmal atrial fibrillation     Past Surgical History:  Procedure Laterality Date   ATRIAL FIBRILLATION ABLATION N/A 11/10/2019   Procedure: ATRIAL FIBRILLATION ABLATION;  Surgeon: [name], MD;  Location: [place] [facility];  Service: Cardiovascular;  Laterality: N/A;   implantable loop recorder placement  03/28/2020    Medtronic Reveal Linq model LNQ 22 (SN [id number] ) implantable loop recorder implanted for afib management   LEFT HEART CATH AND CORONARY ANGIOGRAPHY N/A 02/05/2017   Procedure: LEFT HEART CATH AND CORONARY ANGIOGRAPHY;  Surgeon: Lorretta Harp, MD;  Location: [place] [facility];  Service: Cardiovascular;  Laterality: N/A;     PHYSICAL EXAM:  VS: BP 110/66 (BP Location: Left Arm, Patient Position: Sitting)   Ht 5\' 10"  (1.778 m)   Wt 150 lb (68 kg)   BMI 21.52 kg/m  Physical Exam Gen: NAD, alert, cooperative with exam, well-appearing MSK:  Neurovascularly intact       ASSESSMENT & PLAN:   Lumbar radiculopathy Acutely occurring.  Symptoms seem more consistent with a radicular origin.  Less likely for bursitis. -Counseled on home exercise therapy and supportive care. - IM Depo-Medrol. -Could consider physical therapy or further imaging

## 2022-08-27 NOTE — Assessment & Plan Note (Signed)
Acutely occurring.  Symptoms seem more consistent with a radicular origin.  Less likely for bursitis. -Counseled on home exercise therapy and supportive care. - IM Depo-Medrol. -Could consider physical therapy or further imaging

## 2022-08-27 NOTE — Patient Instructions (Signed)
Nice to meet you Please try heat Please try the exercises   Please send me a message in MyChart with any questions or updates.  Please see me back in 4 weeks.   --Dr. Ladaja Yusupov  

## 2022-08-28 MED ORDER — METHYLPREDNISOLONE ACETATE 40 MG/ML IJ SUSP
40.0000 mg | Freq: Once | INTRAMUSCULAR | Status: AC
  Administered 2022-08-28: 40 mg via INTRAMUSCULAR

## 2022-08-28 MED ORDER — METHYLPREDNISOLONE ACETATE 40 MG/ML IJ SUSP
40.0000 mg | Freq: Once | INTRAMUSCULAR | Status: AC
  Administered 2022-08-28: 40 mg via INTRA_ARTICULAR

## 2022-08-28 NOTE — Addendum Note (Signed)
Addended by: Cresenciano Lick on: 08/28/2022 02:12 PM   Modules accepted: Orders

## 2022-09-01 ENCOUNTER — Ambulatory Visit (INDEPENDENT_AMBULATORY_CARE_PROVIDER_SITE_OTHER): Payer: Medicare Other

## 2022-09-01 DIAGNOSIS — I480 Paroxysmal atrial fibrillation: Secondary | ICD-10-CM | POA: Diagnosis not present

## 2022-09-02 NOTE — Progress Notes (Signed)
Carelink Summary Report / Loop Recorder 

## 2022-09-08 ENCOUNTER — Encounter: Payer: Self-pay | Admitting: *Deleted

## 2022-09-29 LAB — CUP PACEART REMOTE DEVICE CHECK
Date Time Interrogation Session: 20240505231546
Implantable Pulse Generator Implant Date: 20211103

## 2022-09-30 ENCOUNTER — Ambulatory Visit (INDEPENDENT_AMBULATORY_CARE_PROVIDER_SITE_OTHER): Payer: Medicare Other | Admitting: Family Medicine

## 2022-09-30 ENCOUNTER — Encounter: Payer: Self-pay | Admitting: Family Medicine

## 2022-09-30 ENCOUNTER — Other Ambulatory Visit: Payer: Self-pay

## 2022-09-30 VITALS — BP 120/64 | Ht 70.0 in | Wt 150.0 lb

## 2022-09-30 DIAGNOSIS — M2170 Unequal limb length (acquired), unspecified site: Secondary | ICD-10-CM

## 2022-09-30 DIAGNOSIS — M25551 Pain in right hip: Secondary | ICD-10-CM

## 2022-09-30 MED ORDER — METHYLPREDNISOLONE ACETATE 40 MG/ML IJ SUSP
40.0000 mg | Freq: Once | INTRAMUSCULAR | Status: AC
  Administered 2022-09-30: 40 mg via INTRA_ARTICULAR

## 2022-09-30 NOTE — Assessment & Plan Note (Signed)
Acute on chronic in nature.  Symptoms are likely related to small variation with right leg being longer than left. -Green sport insoles with heel lift on the left. -Could consider custom orthotics

## 2022-09-30 NOTE — Assessment & Plan Note (Signed)
Acutely occurring.  He does have degenerative changes appreciated within the hip joint that may be contributing. -Counseled on home exercise therapy and supportive care.- - Corrected leg length discrepancy. -Could consider intra-articular injection. -Injection today

## 2022-09-30 NOTE — Progress Notes (Signed)
  Derrick Compton - 88 y.o. male MRN [id number]  Date of birth: [birth year]  SUBJECTIVE:  Including CC & ROS.  No chief complaint on file.   Derrick Compton is a 88 y.o. male that is presenting with worsening of his right hip pain.  Locates the pain over the lateral aspect of the hip.  Does have component but does appear anterior at times of the right lateral hip.    Review of Systems See HPI   HISTORY: Past Medical, Surgical, Social, and Family History Reviewed & Updated per EMR.   Pertinent Historical Findings include:  Past Medical History:  Diagnosis Date   Anxiety    BPH (benign prostatic hyperplasia)    CAD (coronary artery disease)    Chest pain    Hyperlipidemia    Osteopenia    Paroxysmal atrial fibrillation (HCC)     Past Surgical History:  Procedure Laterality Date   ATRIAL FIBRILLATION ABLATION N/A 11/10/2019   Procedure: ATRIAL FIBRILLATION ABLATION;  Surgeon: Hillis Range, MD;  [facility];  Service: Cardiovascular;  Laterality: N/A;   implantable loop recorder placement  03/28/2020    Medtronic Reveal Linq model LNQ 22 (SN RLB [id number] ) implantable loop recorder implanted for afib management   LEFT HEART CATH AND CORONARY ANGIOGRAPHY N/A 02/05/2017   Procedure: LEFT HEART CATH AND CORONARY ANGIOGRAPHY;  Surgeon: Runell Gess, MD;  Location: [facility];  Service: Cardiovascular;  Laterality: N/A;     PHYSICAL EXAM:  VS: BP 120/64 (BP Location: Left Arm, Patient Position: Sitting)   Ht 5\' 10"  (1.778 m)   Wt 150 lb (68 kg)   BMI 21.52 kg/m  Physical Exam Gen: NAD, alert, cooperative with exam, well-appearing MSK:  Neurovascularly intact    Limited ultrasound: Right hip pain:  Enlarged bursa over the proximal greater trochanter. Thickening of the tissue within the area overlying the greater trochanter  Summary: Findings consistent with greater trochanteric bursitis  Ultrasound and interpretation by Derrick Gandy,  MD  Aspiration/Injection Procedure Note Derrick Compton September 18, 1934  Procedure: Injection Indications: Right hip pain  Procedure Details Consent: Risks of procedure as well as the alternatives and risks of each were explained to the (patient/caregiver).  Consent for procedure obtained. Time Out: Verified patient identification, verified procedure, site/side was marked, verified correct patient position, special equipment/implants available, medications/allergies/relevent history reviewed, required imaging and test results available.  Performed.  The area was cleaned with iodine and alcohol swabs.    The right greater trochanteric bursa was injected using 1 cc's of 40 mg Depo-Medrol and 4 cc's of 0.25% bupivacaine was injected.  Ultrasound was used. Images were obtained in short views showing the injection.     A sterile dressing was applied.  Patient did tolerate procedure well.     ASSESSMENT & PLAN:   Greater trochanteric pain syndrome of right lower extremity Acutely occurring.  He does have degenerative changes appreciated within the hip joint that may be contributing. -Counseled on home exercise therapy and supportive care.- - Corrected leg length discrepancy. -Could consider intra-articular injection. -Injection today  Leg length discrepancy Acute on chronic in nature.  Symptoms are likely related to small variation with right leg being longer than left. -Green sport insoles with heel lift on the left. -Could consider custom orthotics

## 2022-09-30 NOTE — Addendum Note (Signed)
Addended by: Merrilyn Puma on: 09/30/2022 10:42 AM   Modules accepted: Orders

## 2022-09-30 NOTE — Patient Instructions (Signed)
Good to see you Please alternate heat and ice  Please try the exercises  Please try the insoles   Please send me a message in MyChart with any questions or updates.  Please see the clinic back in 4 weeks.   --Dr. Jordan Likes

## 2022-10-06 ENCOUNTER — Ambulatory Visit (INDEPENDENT_AMBULATORY_CARE_PROVIDER_SITE_OTHER): Payer: Medicare Other

## 2022-10-06 DIAGNOSIS — I480 Paroxysmal atrial fibrillation: Secondary | ICD-10-CM | POA: Diagnosis not present

## 2022-10-08 NOTE — Progress Notes (Signed)
Carelink Summary Report / Loop Recorder 

## 2022-10-22 ENCOUNTER — Other Ambulatory Visit: Payer: Self-pay | Admitting: Cardiovascular Disease

## 2022-10-22 DIAGNOSIS — E782 Mixed hyperlipidemia: Secondary | ICD-10-CM

## 2022-10-22 DIAGNOSIS — I2510 Atherosclerotic heart disease of native coronary artery without angina pectoris: Secondary | ICD-10-CM

## 2022-10-28 ENCOUNTER — Encounter: Payer: Self-pay | Admitting: Family Medicine

## 2022-10-28 ENCOUNTER — Ambulatory Visit (INDEPENDENT_AMBULATORY_CARE_PROVIDER_SITE_OTHER): Payer: Medicare Other | Admitting: Family Medicine

## 2022-10-28 VITALS — BP 110/70 | Ht 70.0 in | Wt 150.0 lb

## 2022-10-28 DIAGNOSIS — M25551 Pain in right hip: Secondary | ICD-10-CM | POA: Diagnosis present

## 2022-10-28 NOTE — Progress Notes (Signed)
PCP: Mechele Claude, MD  Subjective:   HPI: Patient is a 88 y.o. male here for right hip pain.  5/7: Derrick Compton is a 88 y.o. male that is presenting with worsening of his right hip pain.  Locates the pain over the lateral aspect of the hip.  Does have component but does appear anterior at times of the right lateral hip.  6/4: Patient reports he's about 95% improved after injection. He is doing his IT band stretches which have helped. No new injuries or complaints.  Past Medical History:  Diagnosis Date   Anxiety    BPH (benign prostatic hyperplasia)    CAD (coronary artery disease)    Chest pain    Hyperlipidemia    Osteopenia    Paroxysmal atrial fibrillation (HCC)     Current Outpatient Medications on File Prior to Visit  Medication Sig Dispense Refill   DULoxetine (CYMBALTA) 60 MG capsule Take 1 capsule (60 mg total) by mouth daily. 90 capsule 2   ELIQUIS 5 MG TABS tablet TAKE 1 TABLET BY MOUTH TWICE A DAY 60 tablet 5   Evolocumab (REPATHA SURECLICK) 140 MG/ML SOAJ INJECT 1 PEN INTO THE SKIN EVERY 14 (FOURTEEN) DAYS. 6 mL 0   LORazepam (ATIVAN) 0.5 MG tablet Take 0.5 mg by mouth every 30 (thirty) days.     mirtazapine (REMERON) 30 MG tablet Take 1 tablet (30 mg total) by mouth at bedtime. 90 tablet 2   Protein POWD Take 1 Scoop by mouth See admin instructions. Take monday- Friday vega essentials shake     rosuvastatin (CRESTOR) 40 MG tablet TAKE 1 TABLET BY MOUTH EVERY DAY 90 tablet 1   No current facility-administered medications on file prior to visit.    Past Surgical History:  Procedure Laterality Date   ATRIAL FIBRILLATION ABLATION N/A 11/10/2019   Procedure: ATRIAL FIBRILLATION ABLATION;  Surgeon: Hillis Range, MD;  [facility];  Service: Cardiovascular;  Laterality: N/A;   implantable loop recorder placement  03/28/2020    Medtronic Reveal Linq model LNQ 22 (SN RLB [id number] ) implantable loop recorder implanted for afib management    LEFT HEART CATH AND CORONARY ANGIOGRAPHY N/A 02/05/2017   Procedure: LEFT HEART CATH AND CORONARY ANGIOGRAPHY;  Surgeon: Runell Gess, MD;  [facility]: [facility];  Service: Cardiovascular;  Laterality: N/A;    No Known Allergies  BP 110/70 (BP Location: Left Arm, Patient Position: Sitting)   Ht 5\' 10"  (1.778 m)   Wt 150 lb (68 kg)   BMI 21.52 kg/m      09/13/2021    9:07 AM  [facility] Exercise  Frequency of aerobic exercise (# of days/week) 6  Average time in minutes 60  Frequency of strengthening activities (# of days/week) 3        No data to display              Objective:  Physical Exam:  Gen: NAD, comfortable in exam room  Right hip: No deformity. FROM with 5/5 strength. No tenderness to palpation. NVI distally. Negative logroll Negative faber, fadir, and piriformis stretches.   Assessment & Plan:  1. Right hip pain - 2/2 trochanteric bursitis.  Much improved following injection.  Hip abduction strengthening, IT band stretches encouraged for next 4-6 weeks.  F/u prn.

## 2022-10-30 NOTE — Progress Notes (Signed)
Carelink Summary Report / Loop Recorder 

## 2022-11-05 LAB — CUP PACEART REMOTE DEVICE CHECK
Date Time Interrogation Session: 20240607230536
Implantable Pulse Generator Implant Date: 20211103

## 2022-11-10 ENCOUNTER — Ambulatory Visit (INDEPENDENT_AMBULATORY_CARE_PROVIDER_SITE_OTHER): Payer: Medicare Other

## 2022-11-10 ENCOUNTER — Ambulatory Visit (INDEPENDENT_AMBULATORY_CARE_PROVIDER_SITE_OTHER): Payer: Medicare Other | Admitting: Psychiatry

## 2022-11-10 ENCOUNTER — Encounter: Payer: Self-pay | Admitting: Psychiatry

## 2022-11-10 DIAGNOSIS — F4001 Agoraphobia with panic disorder: Secondary | ICD-10-CM

## 2022-11-10 DIAGNOSIS — F331 Major depressive disorder, recurrent, moderate: Secondary | ICD-10-CM

## 2022-11-10 DIAGNOSIS — I480 Paroxysmal atrial fibrillation: Secondary | ICD-10-CM

## 2022-11-10 DIAGNOSIS — F5105 Insomnia due to other mental disorder: Secondary | ICD-10-CM | POA: Diagnosis not present

## 2022-11-10 DIAGNOSIS — F411 Generalized anxiety disorder: Secondary | ICD-10-CM | POA: Diagnosis not present

## 2022-11-10 MED ORDER — MIRTAZAPINE 30 MG PO TABS: 30.0000 mg | ORAL_TABLET | Freq: Every day | ORAL | 1 refills | Status: DC

## 2022-11-10 MED ORDER — DULOXETINE HCL 30 MG PO CPEP: 90.0000 mg | ORAL_CAPSULE | Freq: Every day | ORAL | 1 refills | Status: DC

## 2022-11-10 NOTE — Progress Notes (Signed)
Derrick Compton [id number] May 30, 1934 88 y.o.  Subjective:   Patient ID:  Derrick Compton is a 88 y.o. (DOB [birth year]) male.  Chief Complaint:  Chief Complaint  Patient presents with   Follow-up   Depression   Anxiety    Depression        Associated symptoms include no decreased concentration, no fatigue and no suicidal ideas.  Past medical history includes anxiety.   Anxiety Symptoms include nervous/anxious behavior. Patient reports no confusion, decreased concentration or suicidal ideas.     Derrick Compton presents to the office today for follow-up of TRD  And panic and GAD.   At  visit in December he was depressed.  Started mirtazapine and very low dose lithium.  Awakens with a little depression.  Sleep is pretty good and rarely needs bz.  Depression better after a week or so.  Tolerating meds ok.  Not gaining weight like when on mirtazapine before.   When seen July 28, 2018.  No meds were changed. However at some point he discontinued lithium 150 mg daily.  He stopped bc didn't seem to be doing anything and read about all the side effects.  When seen October 2020 the following observations were made: He has relapsed again and the only change is he stopped lithium.  He doesn't think it helped but I question that. He prefers to increase sertraline over restarting lithium.  Tried to address his fears about lithium. Option increase mirtazapine but may lose sleep benefit. Increase sertraline to 50 mg tabs 3 daily for total of 150 mg daily.  December 2020 appointment the following is noted: More  Groggy with increase sertraline and reduced it back to the original 50 mg daily for about 6 weeks. Went into Mind Stress Management and meditation and it seems to help a good bit.  Thinks a lot of his depression is anxiety over his health fears and the meditation helps to deal with it.  covid didn't help his mood.  Worries over his heart bc he has some issues with it. Can enjoy watching  a ball game and function is better.  Doesn't have to stay so busy to be OK now.  Recognizes he needs to change the way he thinks to live with more peace. Rare alprazolam about 2 times monthly.  Business doing well. Notices calming effect from sertraline.  Fewer speeding tickets. Patient reports stable mood and denies depressed or irritable moods.  Less difficulty with anxiety.  Patient denies difficulty with sleep initiation or maintenance. NO appetite disturbance.  Patient reports that energy and motivation have been good.  Patient denies any difficulty with concentration.  Patient denies any suicidal ideation.  Plan:Continue sertraline 50 Continue mirtazapine 30 HS.  11/22/2019 appointment the following is noted: Not so well.  Afib ablation last Thursday and dosen't feel better. Anxiety is worse esp AM.  Like 2 different people.  Later in day is better. AM don't want to do anything and scared.  Normal by lunch usually. Not taking BZ.  Tried it and gets wiped out. If takes both alprazolam and mirtazapine at night then gets agitated sleep. Sleep good and no panic. Taking mirtazapine 30 and sertraline 100 daily. He doesn't like taking multiple meds. Plan: Do not change AMA. Continue sertraline 100 Continue mirtazapine 30 HS. Restart lithium 150 which helped before. Change alprazolam 0.5 to lorazepam 0.5 prn for better tolerability.  08/09/2020 appointment with the following noted: Didn't take lithium long bc it didn't work. Last  3 mos doesn't want to get OOB and is anxious but once up and working feels a lot better.  Little things cause more anxiety.  The other 6 mos was pretty good.  Not unhappy but doesn't get excited about anything. No problems with sleep and looks forward to going to bed. Only drinks when goes out and at most 2 and when does the whole world feels great. Work and home life good.  Some worry age related.  No panic.  No SI Plan: He has relapsed again with sx in the morning  resolved by evening. Depression and anxiety is worse. Claims compliance with sertraline. Continue mirtazapine 30 HS. He doesn't want to retry lithium 150 which helped before. continue lorazepam 0.5 prn for better tolerability. Otherwise no option to improve without change in sertraline. Consider duloxetine, Trintellix or Viibryd Generic first duloxetine switch to 60 mg daily. Reduce sertraline to 1/2 tablet daily and add duloxetine 30 mg capsule 1 daily for 1 week, Then stop sertraline and increase duloxetine to 2 of the 30 mg capsules. When that prescription runs out the refill will be for duloxetine 60 mg capsule 1 daily  10/02/2020 appointment with the following noted: Excellent response to duloxetine with resolution of depression, better energy and enjoyment. Better energy and no panic. Patient reports stable mood and denies depressed or irritable moods.  Patient denies any recent difficulty with anxiety.  Patient denies difficulty with sleep initiation or maintenance. Denies appetite disturbance.  Patient reports that energy and motivation have been good.  Patient denies any difficulty with concentration.  Patient denies any suicidal ideation. Plan: Continue mirtazapine 30 HS. duloxetine 60 mg capsule 1 daily  12/10/21 appt noted: Still on duloxetine 60 mg daily and mirtazapine 30 mg HS Run into big problem in Spring not wanting to get up in AM and progressed worse.  Would feel ok in afternoon for awhile and now depressed and don't know  why I'm depressed. 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 Has to force himself to do something.  Low interest and motivation and energy May stay in bed too long. A little anxiety.  Takes lorazepam to cope and it helps some. No sig alcohol except 2 drinks going out. Still exercises which helps.  11/10/22 appt noted: Progressively worse over 3 mos. Worse in am and better as day progrsses.  Not happy and anxiety.. Only  potential trigger is afib and heart rate swings.  Seen at [facility].  Can exercise but has to pay attention to heart rate but not a real problem.  Still on duloxetine 60 mg daily and mirtazapine 30 mg HS.  No SE Usually likes to play golf but so much anxiety with people he can't even hit th ball.   Lorazepam helps a little. The rest of the year was oK.    No SE.  Past psychiatric medications Wellbutrin 150, citalopram, paroxetine, Deplin,  fluoxetine side effects, sertraline 150  Groggy Duloxetine 60 Mirtazapine 45 drowsy buspirone, Abilify 2 mg, Rexulti 2 mg, lithium 150 Lorazepam, alprazolam 0.5  Review of Systems:  Review of Systems  Constitutional:  Negative for activity change, fatigue and unexpected weight change.  Neurological:  Negative for tremors.  Psychiatric/Behavioral:  Positive for dysphoric mood. Negative for agitation, behavioral problems, confusion, decreased concentration, hallucinations, self-injury, sleep disturbance and suicidal ideas. The patient is nervous/anxious. The patient is not hyperactive.     Medications: I have reviewed the patient's current medications.  Current  Outpatient Medications  Medication Sig Dispense Refill   ELIQUIS 5 MG TABS tablet TAKE 1 TABLET BY MOUTH TWICE A DAY 60 tablet 5   Evolocumab (REPATHA SURECLICK) 140 MG/ML SOAJ INJECT 1 PEN INTO THE SKIN EVERY 14 (FOURTEEN) DAYS. 6 mL 0   LORazepam (ATIVAN) 0.5 MG tablet Take 0.5 mg by mouth every 30 (thirty) days.     Protein POWD Take 1 Scoop by mouth See admin instructions. Take monday- Friday vega essentials shake     rosuvastatin (CRESTOR) 40 MG tablet TAKE 1 TABLET BY MOUTH EVERY DAY 90 tablet 1   DULoxetine (CYMBALTA) 30 MG capsule Take 3 capsules (90 mg total) by mouth daily. 270 capsule 1   mirtazapine (REMERON) 30 MG tablet Take 1 tablet (30 mg total) by mouth at bedtime. 90 tablet 1   No current facility-administered medications for this visit.    Medication Side Effects:  None  Allergies: No Known Allergies  Past Medical History:  Diagnosis Date   Anxiety    BPH (benign prostatic hyperplasia)    CAD (coronary artery disease)    Chest pain    Hyperlipidemia    Osteopenia    Paroxysmal atrial fibrillation (HCC)     History reviewed. No pertinent family history.  Social History   Socioeconomic History   Marital status: Married    Spouse name: Not on file   Number of children: Not on file   Years of education: Not on file   Highest education level: Not on file  Occupational History   Not on file  Tobacco Use   Smoking status: Never   Smokeless tobacco: Never  Substance and Sexual Activity   Alcohol use: Yes    Alcohol/week: 2.0 standard drinks of alcohol    Types: 2 Standard drinks or equivalent per week   Drug use: No   Sexual activity: Not on file  Other Topics Concern   Not on file  Social History Narrative   Lives in [place] with wife.      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   Social Determinants of Health   Financial Resource Strain: Not on file  Food Insecurity: Not on file  Transportation Needs: Not on file  Physical Activity: Not on file  Stress: Not on file  Social Connections: Not on file  Intimate Partner Violence: Not on file    Past Medical History, Surgical history, Social history, and Family history were reviewed and updated as appropriate.   Please see review of systems for further details on the patient's review from today.   Objective:   Physical Exam:  There were no vitals taken for this visit.  Physical Exam Constitutional:      General: He is not in acute distress.    Appearance: He is well-developed.  Musculoskeletal:        General: No deformity.  Neurological:     Mental Status: He is alert and oriented to person, place, and time.     Motor: No tremor.     Coordination: Coordination normal.     Gait: Gait normal.  Psychiatric:        Attention and Perception:  He is attentive. He does not perceive auditory hallucinations.        Mood and Affect: Mood is anxious and depressed. Affect is not labile, blunt, angry or inappropriate.        Speech: Speech normal.        Behavior: Behavior normal.  Thought Content: Thought content normal. Thought content is not delusional. Thought content does not include homicidal or suicidal ideation. Thought content does not include suicidal plan.        Cognition and Memory: Cognition normal.        Judgment: Judgment normal.     Comments: Insight and judgment fair. No auditory or visual hallucinations. No delusions.  Relapse depression     Lab Review:     Component Value Date/Time   NA 140 11/07/2019 0000   K 4.1 11/07/2019 0000   CL 104 11/07/2019 0000   CO2 29 11/07/2019 0000   GLUCOSE 136 (H) 11/07/2019 0000   GLUCOSE 108 (H) 02/11/2011 0533   BUN 16 11/07/2019 0000   CREATININE 0.88 11/07/2019 0000   CALCIUM 9.4 11/07/2019 0000   PROT 7.0 01/04/2020 0903   ALBUMIN 4.3 01/04/2020 0903   AST 21 01/04/2020 0903   ALT 31 01/04/2020 0903   ALKPHOS 101 01/04/2020 0903   BILITOT 0.5 01/04/2020 0903   GFRNONAA 80 11/07/2019 0000   GFRAA 92 11/07/2019 0000       Component Value Date/Time   WBC 6.0 11/07/2019 0000   WBC 5.6 02/09/2011 0510   RBC 4.36 11/07/2019 0000   RBC 4.34 02/09/2011 0510   HGB 13.4 11/07/2019 0000   HCT 39.8 11/07/2019 0000   PLT 237 11/07/2019 0000   MCV 91 11/07/2019 0000   MCH 30.7 11/07/2019 0000   MCH 31.3 02/09/2011 0510   MCHC 33.7 11/07/2019 0000   MCHC 35.1 02/09/2011 0510   RDW 14.3 11/07/2019 0000   LYMPHSABS 1.2 11/07/2019 0000   MONOABS 0.8 02/05/2011 0340   EOSABS 0.2 11/07/2019 0000   BASOSABS 0.0 11/07/2019 0000   01/31/21 normal CMP, B12, other labs at [facility]  No results found for: "POCLITH", "LITHIUM"   No results found for: "PHENYTOIN", "PHENOBARB", "VALPROATE", "CBMZ"   .res Assessment: Plan:    Major depressive disorder,  recurrent episode, moderate (HCC) - Plan: DULoxetine (CYMBALTA) 30 MG capsule, mirtazapine (REMERON) 30 MG tablet  Generalized anxiety disorder - Plan: DULoxetine (CYMBALTA) 30 MG capsule  Panic disorder with agoraphobia - Plan: DULoxetine (CYMBALTA) 30 MG capsule  Insomnia due to mental condition - Plan: mirtazapine (REMERON) 30 MG tablet   30 min appt. Hx multiple recurrences of depression some of which were due to to noncompliance with medication..  Many of the recurrences have been due to noncompliance for various reasons.  He is medication sensitive which complicates things.  Failed multiple meds and types of meds. Tried to educate about diurnal pattern of depression again. He was well 09/2020 but has relapsed again into classic depression diurnal pattern.  Continue mirtazapine 30 HS. Cont lorazepam prn. We discussed the short-term risks associated with benzodiazepines including sedation and increased fall risk among others.  Discussed long-term side effect risk including dependence, potential withdrawal symptoms, and the potential eventual dose-related risk of dementia.  But recent studies from 2020 dispute this association between benzodiazepines and dementia risk. Newer studies in 2020 do not support an association with dementia.  Increase duloxetine 90 mg capsule  Disc option increase but he experiences poop out with these antidepressants so lithium made sense  He'd prefer not to retry lithium.  Options Trintellix or Viibryd if needed.  FU 8 weeks  Meredith Staggers, MD, DFAPA   Please see After Visit Summary for patient specific instructions.  Future Appointments  Date Time Provider Department Center  12/15/2022  7:20 AM [facility] DEVICE REMOTES [facility]  01/19/2023  7:20 AM [facility] DEVICE REMOT[facility]  02/10/2023  8:15 AM Lilian Kapur, Rachelle Hora, DPM [facility]  02/23/2023  7:20 AM [facility] DEVICE REMOTES [facility]     No orders of the defined types were placed in this encounter.     -------------------------------

## 2022-11-27 ENCOUNTER — Other Ambulatory Visit: Payer: Self-pay | Admitting: Physician Assistant

## 2022-12-01 NOTE — Progress Notes (Signed)
Carelink Summary Report / Loop Recorder 

## 2022-12-04 ENCOUNTER — Ambulatory Visit: Payer: Medicare Other

## 2022-12-04 DIAGNOSIS — R001 Bradycardia, unspecified: Secondary | ICD-10-CM

## 2022-12-04 LAB — CUP PACEART REMOTE DEVICE CHECK
Date Time Interrogation Session: 20240710231239
Implantable Pulse Generator Implant Date: 20211103

## 2022-12-11 ENCOUNTER — Telehealth: Payer: Self-pay | Admitting: Psychiatry

## 2022-12-11 NOTE — Telephone Encounter (Signed)
LVM to RC 

## 2022-12-11 NOTE — Telephone Encounter (Signed)
Derrick Compton called at 2:55 to report that the new medication is not helping at all.  He wants to know if he can go back to Lorazepam.  Please call to discuss.  No f/u appt.  Seen 6/17

## 2022-12-15 ENCOUNTER — Ambulatory Visit: Payer: Medicare Other

## 2022-12-15 NOTE — Telephone Encounter (Signed)
Charlie called again this morning at 10:45 with the same report.  Still requesting Lorazepam. [facility].  Still no appt.  I did call him back to set an appt but the call went to straight to VM.  I LM for him to call and make appt. And to request to speak with the nurse so we could information on what the problems are.

## 2022-12-15 NOTE — Telephone Encounter (Signed)
Dr. Jennelle Human has not prescribed lorazepam since 12/23.   10/02/2022 10/02/2022 1  Lorazepam 0.5 Mg Tablet 30.00 30 Sa Sum [id number] Nor ([id number]) 0/0 0.50 LME Medicare [place] 06/13/2022 06/13/2022 1  Lorazepam 0.5 Mg Tablet 30.00 30 Sa Sum

## 2022-12-15 NOTE — Telephone Encounter (Signed)
Left second VM to RC.  

## 2022-12-16 ENCOUNTER — Other Ambulatory Visit: Payer: Self-pay

## 2022-12-16 MED ORDER — LORAZEPAM 0.5 MG PO TABS: 0.5000 mg | ORAL_TABLET | Freq: Every day | ORAL | 1 refills | Status: DC | PRN

## 2022-12-16 NOTE — Telephone Encounter (Signed)
Pended lorazepam separately.   Patient is taking duloxetine 90 mg, mirtazapine 30 mg, and lorazepam prn. He says he takes 1 tab about 3 times per week. He reports sleeping well, but when he gets up he reports depression is bad (8/10). As the day progresses depression decreases and at night it is 3/10.  He has no motivation. He is sleeping well. No SI, no new stressors. He reports "everything is going good in my life and I have no reason to be depressed". He walks and works out daily and reports some benefit from that. If he has something important to do he takes a lorazepam. FU 8/26.

## 2022-12-16 NOTE — Telephone Encounter (Signed)
If he's not having SE from duloxetine (most common is sweating but ask about heart palpitations bc he has afib), then increase duloxetine to 120 the max dose.  If that doesn't help we will switch antidepressants at  his appt.  CC will consider Auvelity.  Avoid TCA  I will send in RX lorazepam.

## 2022-12-16 NOTE — Telephone Encounter (Signed)
LVM to RC 

## 2022-12-17 NOTE — Telephone Encounter (Signed)
LVM to RC, but he seems not to respond to phone calls, but does respond to MyChart messages, so message was sent with recommendations.

## 2022-12-22 NOTE — Progress Notes (Signed)
Carelink Summary Report / Loop Recorder 

## 2023-01-05 ENCOUNTER — Other Ambulatory Visit: Payer: Self-pay | Admitting: Cardiovascular Disease

## 2023-01-06 ENCOUNTER — Ambulatory Visit (INDEPENDENT_AMBULATORY_CARE_PROVIDER_SITE_OTHER): Payer: Medicare Other

## 2023-01-06 DIAGNOSIS — I480 Paroxysmal atrial fibrillation: Secondary | ICD-10-CM | POA: Diagnosis not present

## 2023-01-06 NOTE — Telephone Encounter (Signed)
Prescription refill request for Eliquis received. Indication:afib Last office visit:12/23 Scr:0.8  12/23 Age: 88 Weight:68  kg  Prescription refilled

## 2023-01-13 ENCOUNTER — Other Ambulatory Visit: Payer: Self-pay | Admitting: Cardiovascular Disease

## 2023-01-13 DIAGNOSIS — I2510 Atherosclerotic heart disease of native coronary artery without angina pectoris: Secondary | ICD-10-CM

## 2023-01-13 DIAGNOSIS — E782 Mixed hyperlipidemia: Secondary | ICD-10-CM

## 2023-01-19 ENCOUNTER — Ambulatory Visit (INDEPENDENT_AMBULATORY_CARE_PROVIDER_SITE_OTHER): Payer: Medicare Other | Admitting: Psychiatry

## 2023-01-19 ENCOUNTER — Encounter: Payer: Self-pay | Admitting: Psychiatry

## 2023-01-19 ENCOUNTER — Ambulatory Visit: Payer: Medicare Other

## 2023-01-19 DIAGNOSIS — F4001 Agoraphobia with panic disorder: Secondary | ICD-10-CM | POA: Diagnosis not present

## 2023-01-19 DIAGNOSIS — F411 Generalized anxiety disorder: Secondary | ICD-10-CM | POA: Diagnosis not present

## 2023-01-19 DIAGNOSIS — F5105 Insomnia due to other mental disorder: Secondary | ICD-10-CM

## 2023-01-19 DIAGNOSIS — F331 Major depressive disorder, recurrent, moderate: Secondary | ICD-10-CM

## 2023-01-19 NOTE — Patient Instructions (Signed)
Start Trintellix 5 mg daily and reduce duloxetine to 2 capsules daily for 1 week,  Then increase Trintellix to 10 mg daily and reduce duloxetine to 1 capsule daily for 1 week. Then stop duloxetine. After 2 more weeks if still depressed call and we will increase Trintellix to 20 mg .

## 2023-01-19 NOTE — Progress Notes (Signed)
Derrick Compton [id number] 10-12-1934 88 y.o.  Subjective:   Patient ID:  Derrick Compton is a 88 y.o. (DOB 1934-08-04) male.  Chief Complaint:  Chief Complaint  Patient presents with   Follow-up   Depression   Anxiety   Medication Reaction    Depression        Associated symptoms include no decreased concentration, no fatigue and no suicidal ideas.  Past medical history includes anxiety.   Anxiety Symptoms include nervous/anxious behavior and palpitations. Patient reports no confusion, decreased concentration or suicidal ideas.     Derrick Compton presents to the office today for follow-up of TRD  And panic and GAD.   At  visit in December he was depressed.  Started mirtazapine and very low dose lithium.  Awakens with a little depression.  Sleep is pretty good and rarely needs bz.  Depression better after a week or so.  Tolerating meds ok.  Not gaining weight like when on mirtazapine before.   When seen July 28, 2018.  No meds were changed. However at some point he discontinued lithium 150 mg daily.  He stopped bc didn't seem to be doing anything and read about all the side effects.  When seen October 2020 the following observations were made: He has relapsed again and the only change is he stopped lithium.  He doesn't think it helped but I question that. He prefers to increase sertraline over restarting lithium.  Tried to address his fears about lithium. Option increase mirtazapine but may lose sleep benefit. Increase sertraline to 50 mg tabs 3 daily for total of 150 mg daily.  December 2020 appointment the following is noted: More  Groggy with increase sertraline and reduced it back to the original 50 mg daily for about 6 weeks. Went into Mind Stress Management and meditation and it seems to help a good bit.  Thinks a lot of his depression is anxiety over his health fears and the meditation helps to deal with it.  covid didn't help his mood.  Worries over his heart bc he has  some issues with it. Can enjoy watching a ball game and function is better.  Doesn't have to stay so busy to be OK now.  Recognizes he needs to change the way he thinks to live with more peace. Rare alprazolam about 2 times monthly.  Business doing well. Notices calming effect from sertraline.  Fewer speeding tickets. Patient reports stable mood and denies depressed or irritable moods.  Less difficulty with anxiety.  Patient denies difficulty with sleep initiation or maintenance. NO appetite disturbance.  Patient reports that energy and motivation have been good.  Patient denies any difficulty with concentration.  Patient denies any suicidal ideation.  Plan:Continue sertraline 50 Continue mirtazapine 30 HS.  11/22/2019 appointment the following is noted: Not so well.  Afib ablation last Thursday and dosen't feel better. Anxiety is worse esp AM.  Like 2 different people.  Later in day is better. AM don't want to do anything and scared.  Normal by lunch usually. Not taking BZ.  Tried it and gets wiped out. If takes both alprazolam and mirtazapine at night then gets agitated sleep. Sleep good and no panic. Taking mirtazapine 30 and sertraline 100 daily. He doesn't like taking multiple meds. Plan: Do not change AMA. Continue sertraline 100 Continue mirtazapine 30 HS. Restart lithium 150 which helped before. Change alprazolam 0.5 to lorazepam 0.5 prn for better tolerability.  08/09/2020 appointment with the following noted: Didn't take lithium  long bc it didn't work. Last 3 mos doesn't want to get OOB and is anxious but once up and working feels a lot better.  Little things cause more anxiety.  The other 6 mos was pretty good.  Not unhappy but doesn't get excited about anything. No problems with sleep and looks forward to going to bed. Only drinks when goes out and at most 2 and when does the whole world feels great. Work and home life good.  Some worry age related.  No panic.  No SI Plan: He  has relapsed again with sx in the morning resolved by evening. Depression and anxiety is worse. Claims compliance with sertraline. Continue mirtazapine 30 HS. He doesn't want to retry lithium 150 which helped before. continue lorazepam 0.5 prn for better tolerability. Otherwise no option to improve without change in sertraline. Consider duloxetine, Trintellix or Viibryd Generic first duloxetine switch to 60 mg daily. Reduce sertraline to 1/2 tablet daily and add duloxetine 30 mg capsule 1 daily for 1 week, Then stop sertraline and increase duloxetine to 2 of the 30 mg capsules. When that prescription runs out the refill will be for duloxetine 60 mg capsule 1 daily  10/02/2020 appointment with the following noted: Excellent response to duloxetine with resolution of depression, better energy and enjoyment. Better energy and no panic. Patient reports stable mood and denies depressed or irritable moods.  Patient denies any recent difficulty with anxiety.  Patient denies difficulty with sleep initiation or maintenance. Denies appetite disturbance.  Patient reports that energy and motivation have been good.  Patient denies any difficulty with concentration.  Patient denies any suicidal ideation. Plan: Continue mirtazapine 30 HS. duloxetine 60 mg capsule 1 daily  12/10/21 appt noted: Still on duloxetine 60 mg daily and mirtazapine 30 mg HS Run into big problem in Spring not wanting to get up in AM and progressed worse.  Would feel ok in afternoon for awhile and now depressed and don't know  why I'm depressed. 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 Has to force himself to do something.  Low interest and motivation and energy May stay in bed too long. A little anxiety.  Takes lorazepam to cope and it helps some. No sig alcohol except 2 drinks going out. Still exercises which helps.  11/10/22 appt noted: Progressively worse over 3 mos. Worse in am and better as day  progrsses.  Not happy and anxiety.. Only potential trigger is afib and heart rate swings.  Seen at [facility].  Can exercise but has to pay attention to heart rate but not a real problem.  Still on duloxetine 60 mg daily and mirtazapine 30 mg HS.  No SE Usually likes to play golf but so much anxiety with people he can't even hit th ball.   Lorazepam helps a little. The rest of the year was oK.    01/19/23 appt notd: Went up to 120 duloxetine and felt foggy.  Talked to doctor and they are concerned about it skipping beats.   Dx afib and looking for a cause.  Doctor wants him off anything that might affect heart. Duloxetine 120 did help dep but SE fatigue and reduced to 90 mg daily. No panic in a couple of years.  Prn lorazepam.   Dep worse than anxiety lately.   No SE.  Past psychiatric medications Wellbutrin 150, citalopram, paroxetine, Deplin,  fluoxetine side effects, sertraline 150  Groggy Duloxetine 120 low energy Mirtazapine 45 drowsy  buspirone, Abilify 2 mg, Rexulti 2 mg, lithium 150 Lorazepam, alprazolam 0.5  Review of Systems:  Review of Systems  Constitutional:  Negative for activity change, fatigue and unexpected weight change.  Cardiovascular:  Positive for palpitations.  Neurological:  Negative for tremors.  Psychiatric/Behavioral:  Positive for dysphoric mood. Negative for agitation, behavioral problems, confusion, decreased concentration, hallucinations, self-injury, sleep disturbance and suicidal ideas. The patient is nervous/anxious. The patient is not hyperactive.     Medications: I have reviewed the patient's current medications.  Current Outpatient Medications  Medication Sig Dispense Refill   DULoxetine (CYMBALTA) 30 MG capsule Take 3 capsules (90 mg total) by mouth daily. (Patient taking differently: Take 120 mg by mouth daily.) 270 capsule 1   ELIQUIS 5 MG TABS tablet TAKE 1 TABLET BY MOUTH TWICE A DAY 60 tablet 5   Evolocumab (REPATHA SURECLICK) 140 MG/ML SOAJ  INJECT 1 PEN INTO THE SKIN EVERY 14 (FOURTEEN) DAYS. 6 mL 3   LORazepam (ATIVAN) 0.5 MG tablet Take 1 tablet (0.5 mg total) by mouth daily as needed for anxiety. 30 tablet 1   mirtazapine (REMERON) 30 MG tablet Take 1 tablet (30 mg total) by mouth at bedtime. 90 tablet 1   Protein POWD Take 1 Scoop by mouth See admin instructions. Take monday- Friday vega essentials shake     rosuvastatin (CRESTOR) 40 MG tablet TAKE 1 TABLET BY MOUTH EVERY DAY 90 tablet 1   No current facility-administered medications for this visit.    Medication Side Effects: None  Allergies: No Known Allergies  Past Medical History:  Diagnosis Date   Anxiety    BPH (benign prostatic hyperplasia)    CAD (coronary artery disease)    Chest pain    Hyperlipidemia    Osteopenia    Paroxysmal atrial fibrillation (HCC)     History reviewed. No pertinent family history.  Social History   Socioeconomic History   Marital status: Married    Spouse name: Not on file   Number of children: Not on file   Years of education: Not on file   Highest education level: Not on file  Occupational History   Not on file  Tobacco Use   Smoking status: Never   Smokeless tobacco: Never  Substance and Sexual Activity   Alcohol use: Yes    Alcohol/week: 2.0 standard drinks of alcohol    Types: 2 Standard drinks or equivalent per week   Drug use: No   Sexual activity: Not on file  Other Topics Concern   Not on file  Social History Narrative   Lives in [place] with wife.      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   Social Determinants of Health   Financial Resource Strain: Low Risk  (02/13/2022)   Received from [facility], [facility]   Overall Financial Resource Strain (CARDIA)    Difficulty of Paying Living Expenses: Not hard at all  Food Insecurity: No Food Insecurity (02/13/2022)   Received from [facility], [facility]   Hunger Vital Sign    Worried About Running Out of Food in the Last Year: Never true    Ran Out of Food in the Last Year: Never true  Transportation Needs: No Transportation Needs (02/13/2022)   Received from [facility], [facility]   [facility] - Transportation    In the past 12 months, has lack of transportation kept you from medical  appointments or from getting medications?: No    Lack of Transportation (Non-Medical): No  Physical Activity: Sufficiently Active (09/26/2019)   Received from [facility] visits prior to 07/26/2022., [facility] visits prior to 07/26/2022.   Exercise Vital Sign    Days of Exercise per Week: 5 days    Minutes of Exercise per Session: 60 min  Stress: No Stress Concern Present (11/13/2017)   Received from [facility], [facility]   [facility] - Occupational Stress Questionnaire    Feeling of Stress : Only a little  Social Connections: Unknown (11/13/2017)   Received from [facility], [facility]   Social Connection and Isolation Panel [NHANES]    Frequency of Communication with Friends and Family: Not on file    Frequency of Social Gatherings with Friends and Family: Not on file    Attends Religious Services: Not on file    Active Member of Clubs or Organizations: Not on file    Attends Banker Meetings: Not on file    Marital Status: Married  Intimate Partner Violence: Not on file    Past Medical History, Surgical history, Social history, and Family history were reviewed and updated as appropriate.   Please see review of systems for further details on the patient's review from today.   Objective:   Physical Exam:  There were no vitals taken for this visit.  Physical Exam Constitutional:      General: He is not in acute distress.    Appearance: He is  well-developed.  Musculoskeletal:        General: No deformity.  Neurological:     Mental Status: He is alert and oriented to person, place, and time.     Motor: No tremor.     Coordination: Coordination normal.     Gait: Gait normal.  Psychiatric:        Attention and Perception: He is attentive. He does not perceive auditory hallucinations.        Mood and Affect: Mood is anxious and depressed. Affect is not labile, blunt, angry or inappropriate.        Speech: Speech normal.        Behavior: Behavior normal.        Thought Content: Thought content normal. Thought content is not delusional. Thought content does not include homicidal or suicidal ideation. Thought content does not include suicidal plan.        Cognition and Memory: Cognition normal.        Judgment: Judgment normal.     Comments: Insight and judgment fair. No auditory or visual hallucinations. No delusions.  Relapse depression ongoing worse in AM     Lab Review:     Component Value Date/Time   NA 140 11/07/2019 0000   K 4.1 11/07/2019 0000   CL 104 11/07/2019 0000   CO2 29 11/07/2019 0000   GLUCOSE 136 (H) 11/07/2019 0000   GLUCOSE 108 (H) 02/11/2011 0533   BUN 16 11/07/2019 0000   CREATININE 0.88 11/07/2019 0000   CALCIUM 9.4 11/07/2019 0000   PROT 7.0 01/04/2020 0903   ALBUMIN 4.3 01/04/2020 0903   AST 21 01/04/2020 0903   ALT 31 01/04/2020 0903   ALKPHOS 101 01/04/2020 0903   BILITOT 0.5 01/04/2020 0903   GFRNONAA 80 11/07/2019 0000   GFRAA 92 11/07/2019 0000       Component Value Date/Time  WBC 6.0 11/07/2019 0000   WBC 5.6 02/09/2011 0510   RBC 4.36 11/07/2019 0000   RBC 4.34 02/09/2011 0510   HGB 13.4 11/07/2019 0000   HCT 39.8 11/07/2019 0000   PLT 237 11/07/2019 0000   MCV 91 11/07/2019 0000   MCH 30.7 11/07/2019 0000   MCH 31.3 02/09/2011 0510   MCHC 33.7 11/07/2019 0000   MCHC 35.1 02/09/2011 0510   RDW 14.3 11/07/2019 0000   LYMPHSABS 1.2 11/07/2019 0000   MONOABS 0.8 02/05/2011  0340   EOSABS 0.2 11/07/2019 0000   BASOSABS 0.0 11/07/2019 0000   01/31/21 normal CMP, B12, other labs at [facility]  No results found for: "POCLITH", "LITHIUM"   No results found for: "PHENYTOIN", "PHENOBARB", "VALPROATE", "CBMZ"   .res Assessment: Plan:    Major depressive disorder, recurrent episode, moderate (HCC)  Generalized anxiety disorder  Panic disorder with agoraphobia  Insomnia due to mental condition   30 min appt. Hx multiple recurrences of depression some of which were due to to noncompliance with medication..  Many of the recurrences have been due to noncompliance for various reasons.  He is medication sensitive which complicates things.  Failed multiple meds and types of meds. Tried to educate about diurnal pattern of depression again. He was well 09/2020 but has relapsed again into classic depression diurnal pattern.  Continue mirtazapine 30 HS. Cont lorazepam prn. We discussed the short-term risks associated with benzodiazepines including sedation and increased fall risk among others.  Discussed long-term side effect risk including dependence, potential withdrawal symptoms, and the potential eventual dose-related risk of dementia.  But recent studies from 2020 dispute this association between benzodiazepines and dementia risk. Newer studies in 2020 do not support an association with dementia.  Options Trintellix or Viibryd if needed. Switch to Trintellix bc no cardiac issues.   Start Trintellix 5 mg daily and reduce duloxetine to 2 capsules daily for 1 week,  Then increase Trintellix to 10 mg daily and reduce duloxetine to 1 capsule daily for 1 week. Then stop duloxetine. After 2 more weeks if still depressed call and we will increase Trintellix to 20 mg .  Disc SE in detail and SSRI withdrawal sx.  FU 8 weeks  Meredith Staggers, MD, DFAPA   Please see After Visit Summary for patient specific instructions.  Future Appointments  Date Time Provider  Department Center  02/09/2023  9:55 AM [facility]  02/10/2023  8:15 AM Edwin Cap, DPM [facility]  03/16/2023  9:55 AM [facility]  04/20/2023  9:55 AM [facility]  05/25/2023  9:55 AM [facility]  06/29/2023  9:55 AM [facility]ICE [facility]OT[facility]  08/03/2023  9:55 AM [facility]ICE REMOT[facility]  09/07/2023  9:55 AM [facility] DEVICE REMOT[facility]  10/12/2023  9:55 AM [facility]  11/16/2023  9:55 AM [facility]ICE [facility]  12/21/2023  9:55 AM [facility]  02/29/2024  9:55 AM [facility]    No orders of the defined types were placed in this encounter.     -------------------------------

## 2023-01-21 NOTE — Progress Notes (Signed)
Carelink Summary Report / Loop Recorder 

## 2023-02-09 ENCOUNTER — Ambulatory Visit (INDEPENDENT_AMBULATORY_CARE_PROVIDER_SITE_OTHER): Payer: Medicare Other

## 2023-02-09 DIAGNOSIS — I480 Paroxysmal atrial fibrillation: Secondary | ICD-10-CM

## 2023-02-10 ENCOUNTER — Ambulatory Visit: Payer: Medicare Other | Admitting: Podiatry

## 2023-02-10 ENCOUNTER — Telehealth: Payer: Self-pay

## 2023-02-10 LAB — CUP PACEART REMOTE DEVICE CHECK
Date Time Interrogation Session: 20240914230840
Implantable Pulse Generator Implant Date: 20211103

## 2023-02-10 NOTE — Telephone Encounter (Signed)
Pt last seen 8/26 with changes as noted below.   Start Trintellix 5 mg daily and reduce duloxetine to 2 capsules daily for 1 week,  Then increase Trintellix to 10 mg daily and reduce duloxetine to 1 capsule daily for 1 week. Then stop duloxetine. After 2 more weeks if still depressed call and we will increase Trintellix to 20 mg .  He reports he has been off the duloxetine about a week and is only on 10 mg Trintellix. He reports increased anxiety of 7-8. He said he is afraid to go to sleep at night like he might not wake up. He tries to go to bed and if he can't sleep he will take a 0.5 mg lorazepam and he is able to get to sleep with that. He doesn't take before going to bed because he doesn't want to have to take something regularly. He is able to sleep once taking the lorazepam, gets up once to the RR, and is able to get back to sleep after that. He reports when he wakes up in the morning he is very anxious and needs a lorazepam. He reports about 30 minutes after taking the AM lorazepam he settles down and is good throughout the day from an anxiety standpoint. He is only prescribed 1 lorazepam daily and is worried about running out early. He said he has never had such anxiety before. He reports anxiety 7-8/10 until he takes lorazepam and then reporting 2-3/10. Still reporting he doesn't have any motivation to do anything.

## 2023-02-10 NOTE — Telephone Encounter (Signed)
The Cymbalta has benefit for anxiety but may be wearing off before the Trintellix can help him.  He needs to give the Trintellix another couple of weeks.  It is not always effective enough for anxiety and if it does not turn out to be helpful enough then we will make a change.  But it is too early to do that.  It is a good antidepressant without cardiac SE. In the meantime he can increase lorazepam to 0.5 mg 3 times daily as needed for anxiety.  We can send in a prescription to cover the extra needed.  It does sound it needs to be taken 1 in the morning and 1 at night for sure.  Later we expect he'll be able to stop this.

## 2023-02-11 NOTE — Telephone Encounter (Signed)
LVM to RC 

## 2023-02-12 NOTE — Telephone Encounter (Signed)
Left second VM to RC.

## 2023-02-16 NOTE — Telephone Encounter (Signed)
Called patient again and it went straight to VM. This is my third attempt to reach him. I sent response from Dr. Jennelle Human via MyChart and left another message that I had done this.

## 2023-02-18 ENCOUNTER — Telehealth: Payer: Self-pay | Admitting: Psychiatry

## 2023-02-18 NOTE — Telephone Encounter (Signed)
Pt called and said that he needs trintellix [facility] [address]. He is almost out of the 10 mg samples and he said the 10 mg isn't working

## 2023-02-19 MED ORDER — VORTIOXETINE HBR 20 MG PO TABS: 20.0000 mg | ORAL_TABLET | Freq: Every day | ORAL | 0 refills | Status: DC

## 2023-02-19 NOTE — Telephone Encounter (Signed)
Sent Trintelllix 20 mg daily to pharmacy

## 2023-02-19 NOTE — Telephone Encounter (Signed)
Due to previous ongoing message about Trintellix, please call to get more information.

## 2023-02-23 ENCOUNTER — Ambulatory Visit: Payer: Medicare Other

## 2023-02-23 NOTE — Progress Notes (Signed)
Carelink Summary Report / Loop Recorder 

## 2023-02-26 ENCOUNTER — Other Ambulatory Visit: Payer: Self-pay | Admitting: Psychiatry

## 2023-02-26 NOTE — Telephone Encounter (Signed)
Rf appropriate; lv 01/19/23; nv 03/05/23; lf 12/16/22

## 2023-02-27 ENCOUNTER — Telehealth: Payer: Self-pay | Admitting: Psychiatry

## 2023-02-27 ENCOUNTER — Other Ambulatory Visit: Payer: Self-pay

## 2023-02-27 MED ORDER — LORAZEPAM 0.5 MG PO TABS: 0.5000 mg | ORAL_TABLET | Freq: Three times a day (TID) | ORAL | 0 refills | Status: AC | PRN

## 2023-02-27 NOTE — Telephone Encounter (Signed)
Next visit is 03/05/23.Derrick Compton walked up here this morning stating that his Lorazepam helps when he takes two instead. He needs a refill on Trintellix 20 mg and he has one left. Pharmacy is:  [id number] [place], [place] - [id number]   Phone: [phone]  Fax: [phone]

## 2023-02-27 NOTE — Telephone Encounter (Signed)
Called and it went straight to VM.  I have left multiple messages the last month without return calls. I have sent messages via MyChart with no response. I called and spoke with his wife and asked her to call us.

## 2023-02-27 NOTE — Telephone Encounter (Signed)
Patient called and I read him the info that I had sent in MyChart. He said he had read it in MyChart, but the problem was the lorazepam. Rx had been sent yesterday, but dosing was not correct. Pended a new Rx to Dr. Jennelle Human this afternoon.

## 2023-03-05 ENCOUNTER — Ambulatory Visit: Payer: Medicare Other | Admitting: Psychiatry

## 2023-03-05 ENCOUNTER — Telehealth: Payer: Self-pay | Admitting: Psychiatry

## 2023-03-05 NOTE — Telephone Encounter (Signed)
Patient said he doesn't get messages. Best to send MyChart messages, as he gets those. Can also call his wife and ask her to let him know to call the office.

## 2023-03-05 NOTE — Telephone Encounter (Signed)
LVM TO RTC 

## 2023-03-05 NOTE — Telephone Encounter (Signed)
I will review the chart.  He can just stop Trintellix.  But it is a problem that he doesn't have any appt scheduled.  Schedule ASAP.  And I will look at the chart to see if there is anything else we can do right now.

## 2023-03-05 NOTE — Telephone Encounter (Signed)
I can make one more med trial without seeing him first.  Stop Trintellix.  Verify his current dose when you call for records.   After 5 days off, he can start Auvelity 1 in the AM for 1 week then 1 twice daily.  He can have 1 bottle of samples. RE: poss SE.  15% risk dizziness which may resolve and is not related to any cardio risk.  Possible he could get more anxious and if that is a problem let us know.

## 2023-03-05 NOTE — Telephone Encounter (Signed)
Pt  called and said that he is not doing well at all on the trintellix. He is having bad thoughts doesn't want to get out of bed. He won't leave the house or want to be around anyone. He is not able to function. He doesn't want to wait until dec to see dr, cottle. He wants to get off of the trintellix. Please call him at [phone]

## 2023-03-06 NOTE — Telephone Encounter (Signed)
Patient reports he is taking 20 mg of Trintellix. He was notified of recommendations. Pulled sample of Auvelity and he said he would pick up today.  Attempts to reach patient go straight to VM and he said he never gets any messages. Contacted his wife and he was with her and the information was provided. Printed it as well and put in bag with Auvelity sample for reference.

## 2023-03-06 NOTE — Telephone Encounter (Signed)
Called wife and patient was with her. Recommendations were provided, also printed out note and put in bag with sample of Auvelity.

## 2023-03-11 NOTE — Telephone Encounter (Signed)
Pt needs appt for me to continue treating him.  He has samples.  He needs appt. Pls call.  If you can't reach him, call his wife

## 2023-03-16 ENCOUNTER — Ambulatory Visit: Payer: Medicare Other

## 2023-03-16 DIAGNOSIS — I480 Paroxysmal atrial fibrillation: Secondary | ICD-10-CM

## 2023-03-17 ENCOUNTER — Telehealth: Payer: Self-pay

## 2023-03-17 LAB — CUP PACEART REMOTE DEVICE CHECK
Date Time Interrogation Session: 20241020230418
Implantable Pulse Generator Implant Date: 20211103

## 2023-03-17 NOTE — Telephone Encounter (Signed)
Following alert received from [employer] received for 1 Pause event recorded on 02/16/23 at 13:23, 5 sec device defined duration, ECG c/w CHB with escape beats, up to 8 sec pauses noted and ECG suspension of 17 sec.  Patient denies any symptoms during on 02/16/23 pause. States recently he has felt short of breath which is new for him. Had stress test yesterday. No BB on file. Reports stopping Trintellix 2 weeks ago and he has felt better. Advised patient I will forward to Dr. Rise Paganini to ad ise further.

## 2023-03-31 ENCOUNTER — Telehealth: Payer: Self-pay

## 2023-03-31 NOTE — Telephone Encounter (Signed)
He can stop the Auvelity and the side effects will go away in 1 to 2 days.  I am not going to start another antidepressant until he gets on the schedule.  He complained of Cymbalta not working well when he was on it and I am reluctant to go back to it.  But also agree I do not want to be a long time before he starts something.  But I am not going to start anything new before I see him.  Tell him to get on the schedule and once that happens I will agree to represcribe the Cymbalta until I see him.

## 2023-03-31 NOTE — Telephone Encounter (Signed)
Patient was given Auvelity samples. He reports he has been taking BID x2 weeks. He is dizzy and weak and when he gets out of the car he has to hold onto it. "Feels like I'm going to die." He requests to go back to duloxetine. He had an appt 10/10, but you had to cancel. Have asked admin to reschedule.   Looks like he has had several med adjustments. Last appt in August:   Start Trintellix 5 mg daily and reduce duloxetine to 2 capsules daily for 1 week,  Then increase Trintellix to 10 mg daily and reduce duloxetine to 1 capsule daily for 1 week. Then stop duloxetine. After 2 more weeks if still depressed call and we will increase Trintellix to 20 mg .  Reports he is only taking lorazepam currently.

## 2023-04-01 NOTE — Progress Notes (Signed)
Carelink Summary Report / Loop Recorder 

## 2023-04-01 NOTE — Telephone Encounter (Signed)
Sent My-Chart message

## 2023-04-08 ENCOUNTER — Ambulatory Visit: Payer: Medicare Other | Admitting: Sports Medicine

## 2023-04-08 ENCOUNTER — Encounter: Payer: Self-pay | Admitting: Sports Medicine

## 2023-04-08 ENCOUNTER — Telehealth: Payer: Self-pay

## 2023-04-08 VITALS — BP 110/56 | Ht 70.0 in | Wt 150.0 lb

## 2023-04-08 DIAGNOSIS — M25512 Pain in left shoulder: Secondary | ICD-10-CM

## 2023-04-08 MED ORDER — METHYLPREDNISOLONE ACETATE 40 MG/ML IJ SUSP
40.0000 mg | Freq: Once | INTRAMUSCULAR | Status: AC
  Administered 2023-04-08: 40 mg via INTRA_ARTICULAR

## 2023-04-08 NOTE — Progress Notes (Signed)
   Subjective:    Patient ID: Derrick Compton, male    DOB: 05-27-1934, 88 y.o.   MRN: [id number]  HPI chief complaint: Left shoulder pain  Patient is a very pleasant 88 year old male that presents today with approximately 6 weeks of posterior lateral left shoulder pain.  He denies any trauma.  Pain is most noticeable with activity such as golfing.  Specifically, he notices it on his back swing and initial follow-through.  Pain does not radiate past the elbow.  He denies any nighttime pain.  No weakness.  Past medical history reviewed Medications reviewed Allergies reviewed  Review of Systems As above    Objective:   Physical Exam  Well-developed, well-nourished.  No acute distress  Left shoulder: Full range of motion.  No tenderness to palpation.  There is some atrophy of both the supraspinatus and infraspinatus muscle bellies.  Rotator cuff strength is 5/5.  Mildly positive empty can, positive Hawkins.  Pain is reproducible with resisted supraspinatus.  Neurovascularly intact distally.      Assessment & Plan:   Left shoulder pain secondary to rotator cuff tendinopathy  Left subacromial space is injected with cortisone today.  Patient tolerates this without difficulty.  He may resume activity as tolerated and will follow-up for ongoing or recalcitrant issues.  Consent obtained and verified. Time-out conducted. Noted no overlying erythema, induration, or other signs of local infection. Skin prepped in a sterile fashion. Topical analgesic spray: Ethyl chloride. Joint: Left shoulder (subacromial) Needle: 25-gauge 1.5 inch Completed without difficulty. Meds: 3 cc 1% Xylocaine, 1 cc (40 mg) Depo-Medrol  This note was dictated using Dragon naturally speaking software and may contain errors in syntax, spelling, or content which have not been identified prior to signing this note.

## 2023-04-09 ENCOUNTER — Ambulatory Visit: Payer: Medicare Other | Attending: Pulmonary Disease | Admitting: Pulmonary Disease

## 2023-04-09 ENCOUNTER — Encounter: Payer: Self-pay | Admitting: Pulmonary Disease

## 2023-04-09 VITALS — BP 100/64 | HR 70 | Ht 70.0 in | Wt 152.8 lb

## 2023-04-09 DIAGNOSIS — I480 Paroxysmal atrial fibrillation: Secondary | ICD-10-CM | POA: Diagnosis present

## 2023-04-09 DIAGNOSIS — Z4509 Encounter for adjustment and management of other cardiac device: Secondary | ICD-10-CM | POA: Insufficient documentation

## 2023-04-09 NOTE — Progress Notes (Signed)
Electrophysiology Office Note:   Date:  04/09/2023  ID:  Derrick Compton, DOB May 03, 1935, MRN [id number]  Primary Cardiologist: Nanetta Batty, MD Electrophysiologist: None      History of Present Illness:   Derrick Compton is a 88 y.o. male with h/o bradycardia, paroxysmal AF, mild AS, CAD, ascending aortic aneurysm seen today for routine electrophysiology followup.   Seen in EP Clinic in 12/2021  and was worried about his watch data > HR from 30's to 130's.  Reported at that time he had been evaluated at [facility] and here with no new recommendations. He was running marathons into his 70's but unable to do that any longer. Huston Foley detections were turned on for his ILR.    Remote ILR review from 03/17/23 shows normal battery, no new tachy or brady episodes. No new AF.  1 pause recorded 02/16/23 at 1323 lasting 5 seconds > ECG with CHB with escape beats up to 8 sec pauses. He was asymptomatic with event. Dr. Lalla Brothers aware and watchful waiting.   Since last being seen in our clinic the patient reports he continues to have variable HR's on his watch > from 30's to 130's.  He exercises, goes to the gym and is concerned about getting his HR up due to his aortic aneurysm. Reviewing the event from September (pause), he has no recollection of the event or awareness. He notes he was recently started on new psychiatric medications and became very dizzy on Trintellix and stopped it ~ 1.5 weeks ago with resolution of symptoms.   He denies chest pain, palpitations, dyspnea, PND, orthopnea, nausea, vomiting, dizziness, syncope, edema, weight gain, or early satiety.   Review of systems complete and found to be negative unless listed in HPI.   EP Information / Studies Reviewed:    EKG is ordered today. Personal review as below.  EKG Interpretation Date/Time:  Thursday April 09 2023 13:22:11 EST Ventricular Rate:  70 PR Interval:  198 QRS Duration:  152 QT Interval:  426 QTC Calculation: 460 R  Axis:   -61  Text Interpretation: Sinus rhythm with marked sinus arrhythmia Left axis deviation Left bundle branch block Confirmed by Canary Brim ([id number]) on 04/09/2023 2:54:01 PM   Studies:  LHC 2018 > Ost 1st Diag lesion, 95 %stenosed.  The left ventricular systolic function is normal. LV end diastolic pressure is normal. The left ventricular ejection fraction is 55-65% by visual estimate.  ECHO 04/2021 > normal LV systolic function with G1 DD, mild dilatation of the ascending thoracic aorta measuring 42mm Cardiac PET 06/2022 > no ischemia, no infarction. Normal perfusion, intermediate risk due to borderline reduced myocardial blood flow reserve, LVEF 39% at rest / stress 40%. Moderate coronary calcifications    Arrhythmia / AAD Paroxysmal AF > Dx 05/2019  PVI ablation 11/10/2019 per Dr. Johney Frame  Evaluated by Dr. Lalla Brothers 01/2020 for Watchman   Device ILR 03/28/2020   Risk Assessment/Calculations:    CHA2DS2-VASc Score =     This indicates a  % annual risk of stroke. The patient's score is based upon:              Physical Exam:   VS:  BP 100/64   Pulse 70   Ht 5\' 10"  (1.778 m)   Wt 152 lb 12.8 oz (69.3 kg)   SpO2 97%   BMI 21.92 kg/m    Wt Readings from Last 3 Encounters:  04/09/23 152 lb 12.8 oz (69.3 kg)  04/08/23 150 lb (68 kg)  10/28/22  150 lb (68 kg)     GEN: Well nourished, well developed in no acute distress NECK: No JVD; No carotid bruits CARDIAC: Regular rate and rhythm with occasional ectopy, no murmurs, rubs, gallops RESPIRATORY:  Clear to auscultation without rales, wheezing or rhonchi  ABDOMEN: Soft, non-tender, non-distended EXTREMITIES:  No edema; No deformity   ASSESSMENT AND PLAN:    Bradycardia  -ILR showing pauses from 01/2023, pt awake/asymptomatic for event  -no further pauses noted on ILR since initial event  -EKG with sinus arrhythmia, LBBB, PAC's -brady detections on for ILR -medications reviewed, no offending agents   Paroxysmal AF   CHA2DS2-VASc 3 -on OAC as below  -previously reviewed for Watchman by Dr. Lalla Brothers  Secondary Hypercoagulable State  -Eliquis 5mg  BID, appropriate dosing by Cr/wt  -Cr 01/08/23 0.9 ([facility])  CAD, HLD  -crestor / repatha per primary   Follow up with Dr. Lalla Brothers in 3 months  Signed, Canary Brim, MSN, APRN, NP-C, AGACNP-BC [facility] - Electrophysiology  04/09/2023, 3:08 PM

## 2023-04-09 NOTE — Patient Instructions (Signed)
Medication Instructions:  Your physician recommends that you continue on your current medications as directed. Please refer to the Current Medication list given to you today.  *If you need a refill on your cardiac medications before your next appointment, please call your pharmacy*  Lab Work: None ordered If you have labs (blood work) drawn today and your tests are completely normal, you will receive your results only by: MyChart Message (if you have MyChart) OR A paper copy in the mail If you have any lab test that is abnormal or we need to change your treatment, we will call you to review the results.  Follow-Up: At [facility], you and your health needs are our priority.  As part of our continuing mission to provide you with exceptional heart care, we have created designated Provider Care Teams.  These Care Teams include your primary Cardiologist (physician) and Advanced Practice Providers (APPs -  Physician Assistants and Nurse Practitioners) who all work together to provide you with the care you need, when you need it.  Your next appointment:   3 month(s)  Provider:   You will see one of the following Advanced Practice Providers on your designated Care Team:   Francis Dowse, Charlott Holler [address]" [place], [place] Sherie Don, NP Canary Brim, NP

## 2023-04-10 NOTE — Telephone Encounter (Signed)
Counselor options here: Mardelle Matte or [place] or [place].  Outside of office options: Ernestina Penna.  I've worked a lot with him over the years too.  He needs to keep his appt with me in Jan.  We had trouble getting him to schedule an appt and he is overdue with me.

## 2023-04-16 ENCOUNTER — Other Ambulatory Visit: Payer: Self-pay | Admitting: Psychiatry

## 2023-04-16 DIAGNOSIS — F331 Major depressive disorder, recurrent, moderate: Secondary | ICD-10-CM

## 2023-04-16 DIAGNOSIS — F5105 Insomnia due to other mental disorder: Secondary | ICD-10-CM

## 2023-04-20 ENCOUNTER — Ambulatory Visit (INDEPENDENT_AMBULATORY_CARE_PROVIDER_SITE_OTHER): Payer: Medicare Other | Admitting: Family Medicine

## 2023-04-20 ENCOUNTER — Encounter: Payer: Self-pay | Admitting: Family Medicine

## 2023-04-20 ENCOUNTER — Ambulatory Visit (INDEPENDENT_AMBULATORY_CARE_PROVIDER_SITE_OTHER): Payer: Medicare Other

## 2023-04-20 VITALS — BP 100/62 | Ht 70.0 in | Wt 152.0 lb

## 2023-04-20 DIAGNOSIS — I480 Paroxysmal atrial fibrillation: Secondary | ICD-10-CM

## 2023-04-20 DIAGNOSIS — G8929 Other chronic pain: Secondary | ICD-10-CM

## 2023-04-20 DIAGNOSIS — M25512 Pain in left shoulder: Secondary | ICD-10-CM | POA: Diagnosis present

## 2023-04-20 LAB — CUP PACEART REMOTE DEVICE CHECK
Date Time Interrogation Session: 20241122230440
Implantable Pulse Generator Implant Date: 20211103

## 2023-04-20 MED ORDER — DICLOFENAC SODIUM 1 % EX GEL: 2.0000 g | Freq: Four times a day (QID) | CUTANEOUS | 0 refills | Status: DC

## 2023-04-20 NOTE — Progress Notes (Signed)
CHIEF COMPLAINT: No chief complaint on file.  _____________________________________________________________ SUBJECTIVE  HPI  Pt is a 88 y.o. male here for f/u of L shoulder pain Last seen 12 days ago; 04/08/23 w/ Dr. Margaretha Sheffield for L shoulder pain, dx rotator cuff tendinopathy, underwent L SAB CSI Ongoing several months States the shoulder is better, but still having lateral shoulder pain. States he has difficulty swinging a golf club. Pain is a soreness, pulling sensation, stops within seconds. Noticing w/ swinging golf club Will do resisted ER exercises with a band, and feels pain in the location when he externally rotates his shoulder No numbness/tingling No other therapies tried other than the CSI < 2 weeks ago Prior to injection he was having a sharper pain and more diffuse. Overall states the pain has been better. No recent imaging   Has also been seen for R hip pain (dx GTPS), lumbar radiculopathy, R shoulder pain  ------------------------------------------------------------------------------------------------------ Past Medical History:  Diagnosis Date   Anxiety    BPH (benign prostatic hyperplasia)    CAD (coronary artery disease)    Chest pain    Hyperlipidemia    Osteopenia    Paroxysmal atrial fibrillation (HCC)     Past Surgical History:  Procedure Laterality Date   ATRIAL FIBRILLATION ABLATION N/A 11/10/2019   Procedure: ATRIAL FIBRILLATION ABLATION;  Surgeon: Hillis Range, MD;  Location: [facility] INVASIVE CV LAB;  Service: Cardiovascular;  Laterality: N/A;   implantable loop recorder placement  03/28/2020    Medtronic Reveal Linq model LNQ 22 (SN [id number] ) implantable loop recorder implanted for afib management   LEFT HEART CATH AND CORONARY ANGIOGRAPHY N/A 02/05/2017   Procedure: LEFT HEART CATH AND CORONARY ANGIOGRAPHY;  Surgeon: Runell Gess, MD;  Location: [facility] INVASIVE CV LAB;  Service: Cardiovascular;  Laterality: N/A;      Current Meds  Medication Sig    diclofenac Sodium (VOLTAREN) 1 % GEL Apply 2 g topically 4 (four) times daily.    ------------------------------------------------------------------------------------------------------  _____________________________________________________________ OBJECTIVE  PHYSICAL EXAM  Today's Vitals   04/20/23 1008  BP: 100/62  Weight: 152 lb (68.9 kg)  Height: 5\' 10"  (1.778 m)   Body mass index is 21.81 kg/m.   reviewed  General: A+Ox3, no acute distress, well-nourished, appropriate affect CV: pulses 2+ regular, nondiaphoretic, no peripheral edema, cap refill <2sec Lungs: no audible wheezing, non-labored breathing, bilateral chest rise/fall, nontachypneic Skin: warm, well-perfused, non-icteric, no susp lesions or rashes Neuro: no focal deficits. Sensation intact, muscle tone wnl, no atrophy Psych: no signs of depression or anxiety MSK:   L Shoulder:  No deformity, swelling or muscle wasting No scapular winging FF 180, abd 180, int 0, ext 90, endorsing stiffness end flexion/abduction Tenderness to deep palpation over lateral humerus NTTP over the [place], clavicle, ac, coracoid, biceps groove, deltoid, subacromial space, scap spine, musculature, trap, cervical spine + subscap liftoff, empty can Neg neer, hawkins, scarf test, hornblower, resisted anterior flexion, speeds, obriens, yergason Neg ant drawer, sulcus sign Neg apprehension Negative Spurling's test bilat FROM of neck _____________________________________________________________ ASSESSMENT/PLAN Diagnoses and all orders for this visit:  Chronic left shoulder pain -     Ambulatory referral to Physical Therapy -     diclofenac Sodium (VOLTAREN) 1 % GEL; Apply 2 g topically 4 (four) times daily. -     DG Shoulder Left; Future   Residual L shoulder pain following SAB CSI 12 days ago, reassuring improvement overall, exam findings demonstrate lateral shoulder pain elicited with supraspinatus/subscap activation. May indicate  persistent RTC tendinopathy  vs calcific tendinosis, symptoms may continue to improve as more time is allowed from original procedure.  Options discussed for mgmt/workup: XR to evaluate for osseous etiology PT to support pain relief achieved with injection Voltaren gel script for additional pain mgmt All questions answered. Anticipate f/u in 6-8 weeks, sooner as needed. Patient verbalized understanding, in agreement with plan  No follow-ups on file.  Electronically signed by: Burna Forts, MD 04/20/2023 12:38 PM

## 2023-04-21 ENCOUNTER — Ambulatory Visit (HOSPITAL_BASED_OUTPATIENT_CLINIC_OR_DEPARTMENT_OTHER)
Admission: RE | Admit: 2023-04-21 | Discharge: 2023-04-21 | Disposition: A | Discharge status: Home / Self Care | Payer: Medicare Other | Source: Ambulatory Visit | Attending: Family Medicine | Admitting: Family Medicine

## 2023-04-21 DIAGNOSIS — G8929 Other chronic pain: Secondary | ICD-10-CM | POA: Insufficient documentation

## 2023-04-21 DIAGNOSIS — M25512 Pain in left shoulder: Secondary | ICD-10-CM | POA: Insufficient documentation

## 2023-04-27 ENCOUNTER — Telehealth: Payer: Self-pay

## 2023-04-27 NOTE — Telephone Encounter (Signed)
Alert received from [employer] for There was a 9 second pause event with ventricular escape beats.  There was a 16 second ECG suspension during this pause as well, this occurred during waking hours, sent to triage.   Patient called, was asymptomatic. Pt advised if he experiences lightheaded or dizziness in the future to call, any syncope call 911 and go to ER. Pt agreeable to call with any symptoms in the future. Routing to Dr. Lalla Brothers for review.

## 2023-04-28 NOTE — Telephone Encounter (Signed)
Spoke with the patient and scheduled him for an appointment with Dr. Lalla Brothers tomorrow at [facility]

## 2023-04-29 ENCOUNTER — Encounter: Payer: Self-pay | Admitting: Cardiology

## 2023-04-29 ENCOUNTER — Ambulatory Visit: Payer: Medicare Other | Attending: Cardiology | Admitting: Cardiology

## 2023-04-29 ENCOUNTER — Other Ambulatory Visit: Payer: Self-pay | Admitting: Cardiology

## 2023-04-29 VITALS — BP 104/56 | HR 53 | Ht 70.0 in | Wt 153.0 lb

## 2023-04-29 DIAGNOSIS — R001 Bradycardia, unspecified: Secondary | ICD-10-CM | POA: Diagnosis present

## 2023-04-29 DIAGNOSIS — I480 Paroxysmal atrial fibrillation: Secondary | ICD-10-CM | POA: Diagnosis present

## 2023-04-29 NOTE — Progress Notes (Signed)
Electrophysiology Office Follow up Visit Note:    Date:  04/29/2023   ID:  Derrick Compton, DOB May 15, 1935, MRN [id number]  PCP:  Mechele Claude, MD  [facility] Cardiologist:  Nanetta Batty, MD  [facility] Electrophysiologist:  None    Interval History:     Derrick Compton is a 88 y.o. male who presents for a follow up visit.  I last saw the patient January 31, 2020.  The patient has a history of coronary artery disease.  I previously met the patient to discuss left atrial appendage occlusion given a diagnosis of atrial fibrillation and high risk hobbies (racecar driving).  He has had a prior A-fib ablation with Dr. Johney Frame in 2021.  He has a loop recorder in place.  Loop recorder monitoring has demonstrated progressively lengthening pauses.  He has baseline conduction system disease with a left bundle branch block.  His pauses are occurring during daytime hours.   Discussed the use of AI scribe software for clinical note transcription with the patient, who gave verbal consent to proceed.  History of Present Illness   The patient, with a history of aortic aneurysm, presents with intermittent chest pain and episodes of near syncope. He describes a recent episode where he felt a 'funny feeling' after eating, with difficulty breathing and a sensation of faintness. This episode lasted a few minutes and resolved with rest. He has not experienced a similar episode since. He also reports that his heart rate fluctuates and he experiences minor chest pain. He has been monitoring his heart rate with a watch, which has indicated periods of heart rate below 40 bpm for up to 10 minutes.            Past medical, surgical, social and family history were reviewed.  ROS:   Please see the history of present illness.    All other systems reviewed and are negative.  EKGs/Labs/Other Studies Reviewed:    The following studies were reviewed today:  April 09, 2023 EKG shows sinus  rhythm/sinus arrhythmia.  Left bundle branch block.  Loop recorder alert from November 27 shows a prolonged pause.  The pause occurred over the period of nearly 40 seconds.  There appears to be several escape beats during that nearly 40 seconds time.      Physical Exam:    VS:  BP (!) 104/56   Pulse (!) 53   Ht 5\' 10"  (1.778 m)   Wt 153 lb (69.4 kg)   SpO2 98%   BMI 21.95 kg/m     Wt Readings from Last 3 Encounters:  04/29/23 153 lb (69.4 kg)  04/20/23 152 lb (68.9 kg)  04/09/23 152 lb 12.8 oz (69.3 kg)     GEN: no distress CARD: RRR, No MRG RESP: No IWOB. CTAB.      ASSESSMENT:    1. Bradycardia   2. PAF (paroxysmal atrial fibrillation) (HCC)    PLAN:    In order of problems listed above:  #Atrial fibrillation On Eliquis for stroke prophylaxis  #Bradycardia #Conduction system disease I discussed his loop recorder findings in detail including prolonged pauses.  I discussed how these pauses are long and put him at risk for sudden cardiac death.  We discussed how typically brief pauses lasting 2 to 6 seconds or even slightly longer can be monitored but more prolonged pauses can put patients at increased risk for sudden death.  I would favor a permanent pacemaker implant in the situation especially given the patient's baseline  conduction system disease.  I discussed the pacemaker implant procedure in detail with the patient clued the risks, recovery and he wishes to proceed.  Risks, benefits, alternatives to PPM implantation were discussed in detail with the patient today. The patient understands that the risks include but are not limited to bleeding, infection, pneumothorax, perforation, tamponade, vascular damage, renal failure, MI, stroke, death, and lead dislodgement and wishes to proceed.  We will therefore schedule device implantation at the next available time.  He will need to hold his Eliquis for 2 days prior to the procedure.  He should not drive until after PPM  implanted.  Plan for MDT DDD PPM.    Signed, Steffanie Dunn, MD, [facility], [facility] 04/29/2023 3:19 PM    Electrophysiology [facility]

## 2023-04-29 NOTE — Patient Instructions (Signed)
Medication Instructions:  Your physician recommends that you continue on your current medications as directed. Please refer to the Current Medication list given to you today.  *If you need a refill on your cardiac medications before your next appointment, please call your pharmacy*   Lab Work: TODAY: BMET and CBC  If you have labs (blood work) drawn today and your tests are completely normal, you will receive your results only by: MyChart Message (if you have MyChart) OR A paper copy in the mail If you have any lab test that is abnormal or we need to change your treatment, we will call you to review the results.   Testing/Procedures: Your physician has recommended that you have a pacemaker inserted. A pacemaker is a small device that is placed under the skin of your chest or abdomen to help control abnormal heart rhythms. This device uses electrical pulses to prompt the heart to beat at a normal rate. Pacemakers are used to treat heart rhythms that are too slow. Wire (leads) are attached to the pacemaker that goes into the chambers of you heart. This is done in the hospital and usually requires and overnight stay. Please see the instruction sheet given to you today for more information.   Follow-Up: At [facility], you and your health needs are our priority.  As part of our continuing mission to provide you with exceptional heart care, we have created designated Provider Care Teams.  These Care Teams include your primary Cardiologist (physician) and Advanced Practice Providers (APPs -  Physician Assistants and Nurse Practitioners) who all work together to provide you with the care you need, when you need it.  Your next appointment:   Your follow up appointments will be arranged at discharge

## 2023-04-29 NOTE — H&P (View-Only) (Signed)
 Electrophysiology Office Follow up Visit Note:    Date:  04/29/2023   ID:  Derrick Compton, DOB May 15, 1935, MRN [id number]  PCP:  Mechele Claude, MD  [facility] Cardiologist:  Nanetta Batty, MD  [facility] Electrophysiologist:  None    Interval History:     Derrick Compton is a 88 y.o. male who presents for a follow up visit.  I last saw the patient January 31, 2020.  The patient has a history of coronary artery disease.  I previously met the patient to discuss left atrial appendage occlusion given a diagnosis of atrial fibrillation and high risk hobbies (racecar driving).  He has had a prior A-fib ablation with Dr. Johney Frame in 2021.  He has a loop recorder in place.  Loop recorder monitoring has demonstrated progressively lengthening pauses.  He has baseline conduction system disease with a left bundle branch block.  His pauses are occurring during daytime hours.   Discussed the use of AI scribe software for clinical note transcription with the patient, who gave verbal consent to proceed.  History of Present Illness   The patient, with a history of aortic aneurysm, presents with intermittent chest pain and episodes of near syncope. He describes a recent episode where he felt a 'funny feeling' after eating, with difficulty breathing and a sensation of faintness. This episode lasted a few minutes and resolved with rest. He has not experienced a similar episode since. He also reports that his heart rate fluctuates and he experiences minor chest pain. He has been monitoring his heart rate with a watch, which has indicated periods of heart rate below 40 bpm for up to 10 minutes.            Past medical, surgical, social and family history were reviewed.  ROS:   Please see the history of present illness.    All other systems reviewed and are negative.  EKGs/Labs/Other Studies Reviewed:    The following studies were reviewed today:  April 09, 2023 EKG shows sinus  rhythm/sinus arrhythmia.  Left bundle branch block.  Loop recorder alert from November 27 shows a prolonged pause.  The pause occurred over the period of nearly 40 seconds.  There appears to be several escape beats during that nearly 40 seconds time.      Physical Exam:    VS:  BP (!) 104/56   Pulse (!) 53   Ht 5\' 10"  (1.778 m)   Wt 153 lb (69.4 kg)   SpO2 98%   BMI 21.95 kg/m     Wt Readings from Last 3 Encounters:  04/29/23 153 lb (69.4 kg)  04/20/23 152 lb (68.9 kg)  04/09/23 152 lb 12.8 oz (69.3 kg)     GEN: no distress CARD: RRR, No MRG RESP: No IWOB. CTAB.      ASSESSMENT:    1. Bradycardia   2. PAF (paroxysmal atrial fibrillation) (HCC)    PLAN:    In order of problems listed above:  #Atrial fibrillation On Eliquis for stroke prophylaxis  #Bradycardia #Conduction system disease I discussed his loop recorder findings in detail including prolonged pauses.  I discussed how these pauses are long and put him at risk for sudden cardiac death.  We discussed how typically brief pauses lasting 2 to 6 seconds or even slightly longer can be monitored but more prolonged pauses can put patients at increased risk for sudden death.  I would favor a permanent pacemaker implant in the situation especially given the patient's baseline  conduction system disease.  I discussed the pacemaker implant procedure in detail with the patient clued the risks, recovery and he wishes to proceed.  Risks, benefits, alternatives to PPM implantation were discussed in detail with the patient today. The patient understands that the risks include but are not limited to bleeding, infection, pneumothorax, perforation, tamponade, vascular damage, renal failure, MI, stroke, death, and lead dislodgement and wishes to proceed.  We will therefore schedule device implantation at the next available time.  He will need to hold his Eliquis for 2 days prior to the procedure.  He should not drive until after PPM  implanted.  Plan for MDT DDD PPM.    Signed, Steffanie Dunn, MD, [facility], [facility] 04/29/2023 3:19 PM    Electrophysiology [facility]

## 2023-04-30 ENCOUNTER — Encounter: Payer: Self-pay | Admitting: Cardiology

## 2023-04-30 LAB — BASIC METABOLIC PANEL
BUN/Creatinine Ratio: 23 (ref 10–24)
BUN: 23 mg/dL (ref 8–27)
CO2: 28 mmol/L (ref 20–29)
Calcium: 9.3 mg/dL (ref 8.6–10.2)
Chloride: 101 mmol/L (ref 96–106)
Creatinine, Ser: 0.98 mg/dL (ref 0.76–1.27)
Glucose: 96 mg/dL (ref 70–99)
Potassium: 4.3 mmol/L (ref 3.5–5.2)
Sodium: 141 mmol/L (ref 134–144)
eGFR: 75 mL/min/{1.73_m2} (ref >59)

## 2023-04-30 LAB — CBC WITH DIFFERENTIAL/PLATELET
Basophils Absolute: 0.1 10*3/uL (ref 0.0–0.2)
Basos: 1 %
EOS (ABSOLUTE): 0.3 10*3/uL (ref 0.0–0.4)
Eos: 4 %
Hematocrit: 38.3 % (ref 37.5–51.0)
Hemoglobin: 12.8 g/dL — ABNORMAL LOW (ref 13.0–17.7)
Immature Grans (Abs): 0 10*3/uL (ref 0.0–0.1)
Immature Granulocytes: 0 %
Lymphocytes Absolute: 1.4 10*3/uL (ref 0.7–3.1)
Lymphs: 21 %
MCH: 30.9 pg (ref 26.6–33.0)
MCHC: 33.4 g/dL (ref 31.5–35.7)
MCV: 93 fL (ref 79–97)
Monocytes Absolute: 0.5 10*3/uL (ref 0.1–0.9)
Monocytes: 8 %
Neutrophils Absolute: 4.3 10*3/uL (ref 1.4–7.0)
Neutrophils: 66 %
Platelets: 176 10*3/uL (ref 150–450)
RBC: 4.14 x10E6/uL (ref 4.14–5.80)
RDW: 12.4 % (ref 11.6–15.4)
WBC: 6.6 10*3/uL (ref 3.4–10.8)

## 2023-05-04 ENCOUNTER — Other Ambulatory Visit: Payer: Self-pay | Admitting: Cardiovascular Disease

## 2023-05-04 DIAGNOSIS — I480 Paroxysmal atrial fibrillation: Secondary | ICD-10-CM

## 2023-05-04 NOTE — Telephone Encounter (Signed)
Eliquis 5mg  refill request received. Patient is 88 years old, weight-69.4kg, Crea-0.94 on 04/29/23, Diagnosis-Afib, and last seen by Dr. Lalla Brothers on 04/29/23. Dose is appropriate based on dosing criteria. Will send in refill to requested pharmacy.

## 2023-05-04 NOTE — Pre-Procedure Instructions (Signed)
Instructed patient on the following items: Arrival time 3:00 Nothing to eat or drink after midnight No meds AM of procedure Responsible person to drive you home and stay with you for 24 hrs Wash with special soap night before and morning of procedure If on anti-coagulant drug instructions Eliquis- 12/8

## 2023-05-06 ENCOUNTER — Ambulatory Visit (HOSPITAL_COMMUNITY)
Admission: RE | Admit: 2023-05-06 | Discharge: 2023-05-07 | Disposition: A | Discharge status: Home / Self Care | Payer: Medicare Other | Attending: Cardiology | Admitting: Cardiology

## 2023-05-06 ENCOUNTER — Other Ambulatory Visit: Payer: Self-pay

## 2023-05-06 ENCOUNTER — Encounter (HOSPITAL_COMMUNITY)
Admission: RE | Disposition: A | Discharge status: Home / Self Care | Payer: Self-pay | Source: Home / Self Care | Attending: Cardiology

## 2023-05-06 DIAGNOSIS — I2510 Atherosclerotic heart disease of native coronary artery without angina pectoris: Secondary | ICD-10-CM | POA: Insufficient documentation

## 2023-05-06 DIAGNOSIS — I442 Atrioventricular block, complete: Secondary | ICD-10-CM | POA: Diagnosis not present

## 2023-05-06 DIAGNOSIS — R001 Bradycardia, unspecified: Secondary | ICD-10-CM | POA: Diagnosis not present

## 2023-05-06 DIAGNOSIS — Z950 Presence of cardiac pacemaker: Secondary | ICD-10-CM | POA: Diagnosis present

## 2023-05-06 DIAGNOSIS — I480 Paroxysmal atrial fibrillation: Secondary | ICD-10-CM | POA: Insufficient documentation

## 2023-05-06 HISTORY — PX: PACEMAKER IMPLANT: EP1218

## 2023-05-06 HISTORY — PX: LOOP RECORDER REMOVAL: EP1215

## 2023-05-06 SURGERY — PACEMAKER IMPLANT

## 2023-05-06 MED ORDER — ACETAMINOPHEN 325 MG PO TABS
325.0000 mg | ORAL_TABLET | ORAL | Status: DC | PRN
  Administered 2023-05-06 – 2023-05-07 (×2): 650 mg via ORAL
  Filled 2023-05-06 (×2): qty 2

## 2023-05-06 MED ORDER — SODIUM CHLORIDE 0.9 % IV SOLN
80.0000 mg | INTRAVENOUS | Status: AC
  Administered 2023-05-06: 80 mg

## 2023-05-06 MED ORDER — FENTANYL CITRATE (PF) 100 MCG/2ML IJ SOLN
INTRAMUSCULAR | Status: AC
  Filled 2023-05-06: qty 2

## 2023-05-06 MED ORDER — POVIDONE-IODINE 10 % EX SWAB
2.0000 | Freq: Once | CUTANEOUS | Status: AC
  Administered 2023-05-06: 2 via TOPICAL

## 2023-05-06 MED ORDER — LIDOCAINE HCL 1 % IJ SOLN
INTRAMUSCULAR | Status: AC
  Filled 2023-05-06: qty 60

## 2023-05-06 MED ORDER — MIRTAZAPINE 7.5 MG PO TABS
30.0000 mg | ORAL_TABLET | Freq: Every day | ORAL | Status: DC
  Filled 2023-05-06: qty 4

## 2023-05-06 MED ORDER — LIDOCAINE HCL (PF) 1 % IJ SOLN
INTRAMUSCULAR | Status: DC | PRN
  Administered 2023-05-06: 60 mL

## 2023-05-06 MED ORDER — FENTANYL CITRATE (PF) 100 MCG/2ML IJ SOLN
INTRAMUSCULAR | Status: DC | PRN
  Administered 2023-05-06: 25 ug via INTRAVENOUS

## 2023-05-06 MED ORDER — MIDAZOLAM HCL 5 MG/5ML IJ SOLN
INTRAMUSCULAR | Status: DC | PRN
  Administered 2023-05-06: 1 mg via INTRAVENOUS

## 2023-05-06 MED ORDER — CEFAZOLIN SODIUM-DEXTROSE 2-4 GM/100ML-% IV SOLN
2.0000 g | INTRAVENOUS | Status: AC
  Administered 2023-05-06: 2 g via INTRAVENOUS

## 2023-05-06 MED ORDER — SODIUM CHLORIDE 0.9 % IV SOLN: INTRAVENOUS | Status: DC

## 2023-05-06 MED ORDER — CEFAZOLIN SODIUM-DEXTROSE 2-4 GM/100ML-% IV SOLN
INTRAVENOUS | Status: AC
  Filled 2023-05-06: qty 100

## 2023-05-06 MED ORDER — HEPARIN (PORCINE) IN NACL 1000-0.9 UT/500ML-% IV SOLN
INTRAVENOUS | Status: DC | PRN
  Administered 2023-05-06: 500 mL

## 2023-05-06 MED ORDER — FINASTERIDE 5 MG PO TABS
5.0000 mg | ORAL_TABLET | Freq: Every day | ORAL | Status: DC
  Administered 2023-05-06: 5 mg via ORAL
  Filled 2023-05-06: qty 1

## 2023-05-06 MED ORDER — MIDAZOLAM HCL 5 MG/5ML IJ SOLN
INTRAMUSCULAR | Status: AC
  Filled 2023-05-06: qty 5

## 2023-05-06 MED ORDER — LORAZEPAM 0.5 MG PO TABS: 0.5000 mg | ORAL_TABLET | Freq: Three times a day (TID) | ORAL | Status: DC | PRN

## 2023-05-06 MED ORDER — DULOXETINE HCL 30 MG PO CPEP
30.0000 mg | ORAL_CAPSULE | Freq: Two times a day (BID) | ORAL | Status: DC
  Administered 2023-05-06 – 2023-05-07 (×2): 30 mg via ORAL
  Filled 2023-05-06 (×2): qty 1

## 2023-05-06 MED ORDER — SODIUM CHLORIDE 0.9 % IV SOLN
INTRAVENOUS | Status: AC
  Filled 2023-05-06: qty 2

## 2023-05-06 MED ORDER — ONDANSETRON HCL 4 MG/2ML IJ SOLN: 4.0000 mg | Freq: Four times a day (QID) | INTRAMUSCULAR | Status: DC | PRN

## 2023-05-06 SURGICAL SUPPLY — 13 items
CABLE SURGICAL S-101-97-12 (CABLE) ×2 IMPLANT
CATH RIGHTSITE C315HIS02 (CATHETERS) IMPLANT
IPG PACE AZUR XT DR MRI W1DR01 (Pacemaker) IMPLANT
LEAD CAPSURE NOVUS 5076-52CM (Lead) IMPLANT
LEAD SELECT SECURE 3830 383069 (Lead) IMPLANT
PACE AZURE XT DR MRI W1DR01 (Pacemaker) ×2 IMPLANT
PAD DEFIB RADIO PHYSIO CONN (PAD) ×2 IMPLANT
SELECT SECURE 3830 383069 (Lead) ×2 IMPLANT
SHEATH 7FR PRELUDE SNAP 13 (SHEATH) IMPLANT
SHEATH PROBE COVER 6X72 (BAG) IMPLANT
SLITTER 6232ADJ (MISCELLANEOUS) IMPLANT
TRAY PACEMAKER INSERTION (PACKS) ×2 IMPLANT
WIRE HI TORQ VERSACORE-J 145CM (WIRE) IMPLANT

## 2023-05-06 NOTE — Interval H&P Note (Signed)
History and Physical Interval Note:  05/06/2023 3:46 PM  Derrick Compton  has presented today for surgery, with the diagnosis of bradicardia.  The various methods of treatment have been discussed with the patient and family. After consideration of risks, benefits and other options for treatment, the patient has consented to  Procedure(s): PACEMAKER IMPLANT (N/A) as a surgical intervention.  The patient's history has been reviewed, patient examined, no change in status, stable for surgery.  I have reviewed the patient's chart and labs.  Questions were answered to the patient's satisfaction.     Derrick Compton

## 2023-05-07 ENCOUNTER — Ambulatory Visit (HOSPITAL_COMMUNITY): Payer: Medicare Other

## 2023-05-07 ENCOUNTER — Ambulatory Visit: Payer: Medicare Other | Admitting: Physical Therapy

## 2023-05-07 ENCOUNTER — Encounter (HOSPITAL_COMMUNITY): Payer: Self-pay | Admitting: Cardiology

## 2023-05-07 ENCOUNTER — Other Ambulatory Visit (HOSPITAL_COMMUNITY): Payer: Self-pay

## 2023-05-07 DIAGNOSIS — I480 Paroxysmal atrial fibrillation: Secondary | ICD-10-CM | POA: Diagnosis not present

## 2023-05-07 DIAGNOSIS — Z950 Presence of cardiac pacemaker: Secondary | ICD-10-CM

## 2023-05-07 DIAGNOSIS — R001 Bradycardia, unspecified: Secondary | ICD-10-CM | POA: Diagnosis not present

## 2023-05-07 DIAGNOSIS — I2510 Atherosclerotic heart disease of native coronary artery without angina pectoris: Secondary | ICD-10-CM | POA: Diagnosis not present

## 2023-05-07 DIAGNOSIS — I442 Atrioventricular block, complete: Secondary | ICD-10-CM | POA: Diagnosis not present

## 2023-05-07 MED ORDER — METOPROLOL SUCCINATE ER 25 MG PO TB24
25.0000 mg | ORAL_TABLET | Freq: Every day | ORAL | Status: DC
  Administered 2023-05-07: 25 mg via ORAL
  Filled 2023-05-07: qty 1

## 2023-05-07 MED ORDER — METOPROLOL SUCCINATE ER 25 MG PO TB24
25.0000 mg | ORAL_TABLET | Freq: Every day | ORAL | 5 refills | Status: DC
  Filled 2023-05-07: qty 30, 30d supply, fill #0

## 2023-05-07 NOTE — Plan of Care (Signed)
  Problem: Education: Goal: Knowledge of cardiac device and self-care will improve Outcome: Progressing Goal: Ability to safely manage health related needs after discharge will improve Outcome: Progressing   Problem: Cardiac: Goal: Ability to achieve and maintain adequate cardiopulmonary perfusion will improve Outcome: Progressing   Problem: Education: Goal: Knowledge of General Education information will improve Description: Including pain rating scale, medication(s)/side effects and non-pharmacologic comfort measures Outcome: Progressing   Problem: Health Behavior/Discharge Planning: Goal: Ability to manage health-related needs will improve Outcome: Progressing   Problem: Clinical Measurements: Goal: Ability to maintain clinical measurements within normal limits will improve Outcome: Progressing Goal: Will remain free from infection Outcome: Progressing Goal: Diagnostic test results will improve Outcome: Progressing

## 2023-05-07 NOTE — Progress Notes (Signed)
Patient provided with verbal discharge instructions. Paper copy of discharge provided to patient. RN answered all questions. VSS at discharge. IV removed. Patient belongings sent with patient. Patient dc'd via wheelchair to private vehicle  

## 2023-05-07 NOTE — TOC Transition Note (Signed)
Transition of Care [facility]) - Discharge Note   Patient Details  Name: Derrick Compton MRN: [id number] Date of Birth: 1934-09-23  Transition of [facility]) CM/SW Contact:  Harriet Masson, RN Phone Number: 05/07/2023, 12:27 PM   Clinical Narrative:    Patient stable for discharge.  No TOC needs at this time.    Final next level of care: Home/Self Care Barriers to Discharge: Barriers Resolved   Patient Goals and CMS Choice Patient states their goals for this hospitalization and ongoing recovery are:: return home          Discharge Placement             home          Discharge Plan and Services Additional resources added to the After Visit Summary for                                       Social Drivers of Health (SDOH) Interventions SDOH Screenings   Food Insecurity: No Food Insecurity (05/07/2023)  Transportation Needs: No Transportation Needs (05/07/2023)  Utilities: Not At Risk (05/07/2023)  Depression (PHQ2-9): Medium Risk (06/14/2020)  Financial Resource Strain: Low Risk  (02/13/2022)   Received from [facility], [facility]  Physical Activity: Sufficiently Active (09/26/2019)   Received from [facility] visits prior to 07/26/2022., [facility] visits prior to 07/26/2022.  Social Connections: Unknown (11/13/2017)   Received from [facility], [facility]  Stress: No Stress Concern Present (11/13/2017)   Received from [facility], [facility]  Tobacco Use: Low Risk  (05/07/2023)     Readmission Risk Interventions    05/07/2023   12:26 PM  Readmission Risk Prevention Plan  Post Dischage Appt Complete  Medication Screening Complete  Transportation Screening Complete

## 2023-05-07 NOTE — Discharge Instructions (Signed)
After Your Pacemaker (and loop removal)   You have a Medtronic Pacemaker  ACTIVITY Do not lift your arm above shoulder height for 1 week after your procedure. After 7 days, you may progress as below.  You should remove your sling 24 hours after your procedure, unless otherwise instructed by your provider.     Thursday May 14, 2023  Friday May 15, 2023 Saturday May 16, 2023 Sunday May 17, 2023   Do not lift, push, pull, or carry anything over 10 pounds with the affected arm until 6 weeks (Thursday June 18, 2023 ) after your procedure.   You may drive AFTER your wound check, unless you have been told otherwise by your provider.   Ask your healthcare provider when you can go back to work   INCISION/Dressing If you are on a blood thinner such as Coumadin, Xarelto, Eliquis, Plavix, or Pradaxa please confirm with your provider when this should be resumed.  RESUME Eliquis on 05/12/23  If large square, outer bandage is left in place, this can be removed after 24 hours from your procedure. Do not remove steri-strips or glue as below.   If a PRESSURE DRESSING (a bulky dressing that usually goes up over your shoulder) was applied or left in place, please follow instructions given by your provider on when to return to have this removed.   Monitor your Pacemaker site for redness, swelling, and drainage. Call the device clinic at [phone] if you experience these symptoms or fever/chills.  If your incisions are sealed with Steri-strips or staples, you may shower 7 days after your procedure or when told by your provider. Do not remove the steri-strips or let the shower hit directly on your site. You may wash around your site with soap and water.    If you were discharged in a sling, please do not wear this during the day more than 48 hours after your surgery unless otherwise instructed. This may increase the risk of stiffness and soreness in your shoulder.   Avoid lotions,  ointments, or perfumes over your incision until it is well-healed.  You may use a hot tub or a pool AFTER your wound check appointment if the incision is completely closed.  Pacemaker Alerts:  Some alerts are vibratory and others beep. These are NOT emergencies. Please call our office to let us know. If this occurs at night or on weekends, it can wait until the next business day. Send a remote transmission.  If your device is capable of reading fluid status (for heart failure), you will be offered monthly monitoring to review this with you.   DEVICE MANAGEMENT Remote monitoring is used to monitor your pacemaker from home. This monitoring is scheduled every 91 days by our office. It allows [place] to keep an eye on the functioning of your device to ensure it is working properly. You will routinely see your Electrophysiologist annually (more often if necessary).   You should receive your ID card for your new device in 4-8 weeks. Keep this card with you at all times once received. Consider wearing a medical alert bracelet or necklace.  Your Pacemaker may be MRI compatible. This will be discussed at your next office visit/wound check.  You should avoid contact with strong electric or magnetic fields.   Do not use amateur (ham) radio equipment or electric (arc) welding torches. MP3 player headphones with magnets should not be used. Some devices are safe to use if held at least 12 inches (30 cm) from  your Pacemaker. These include power tools, lawn mowers, and speakers. If you are unsure if something is safe to use, ask your health care provider.  When using your cell phone, hold it to the ear that is on the opposite side from the Pacemaker. Do not leave your cell phone in a pocket over the Pacemaker.  You may safely use electric blankets, heating pads, computers, and microwave ovens.  Call the office right away if: You have chest pain. You feel more short of breath than you have felt before. You feel  more light-headed than you have felt before. Your incision starts to open up.  This information is not intended to replace advice given to you by your health care provider. Make sure you discuss any questions you have with your health care provider.

## 2023-05-07 NOTE — Discharge Summary (Addendum)
ELECTROPHYSIOLOGY PROCEDURE DISCHARGE SUMMARY    Patient ID: Derrick Compton,  MRN: [id number], DOB/AGE: 01-19-1935 88 y.o.  Admit date: 05/06/2023 Discharge date: 05/07/2023  Primary Care Physician: Mechele Claude, MD  Primary Cardiologist: Dr. Allyson Sabal Electrophysiologist: Dr. Lalla Brothers  Primary Discharge Diagnosis:  Symptomatic bradycardia status post pacemaker implantation this admission  Secondary Discharge Diagnosis:  Paroxysmal Afib CAD  Allergies  Allergen Reactions   Niacin Other (See Comments)    panic attack   Prednisone Other (See Comments)    Upset stomach      Procedures This Admission:  1.  Implantation of a MDT dual chamber PPM and loop removal on 05/06/23   There were no immediate procedural complications CXR on 05/07/23 demonstrated no pneumothorax status post device implantation.   Brief HPI: Derrick Compton is a 88 y.o. male was is followed in the clinic with hx as above with ILR for AFib burden management.  Noted to have developed transient asymptomatic CHB and subsequently asystolic event with near syncope, and advised to have [facility]   Hospital Course:  The patient was admitted and underwent implantation of a PPM and loop removed with details as outlined in the procedure report.  He was monitored on telemetry overnight which demonstrated A pacing, frequent PACs.  Left chest was both sites are without hematoma or ecchymosis.  The device was interrogated and found to be functioning normally.  CXR was obtained and demonstrated no pneumothorax status post device implantation.  Wound care, arm mobility, and restrictions were reviewed with the patient.  The patient feels well, denies any CP/SOB, with minimal site discomfort.  He was examined by Dr. Lalla Brothers and considered stable for discharge to home.   Resume Eliquis 05/12/23 Add Toprol 25mg  daily for frequent PACs/AFib  Physical Exam: Vitals:   05/06/23 2351 05/07/23 0400 05/07/23 0753 05/07/23 1030   BP:  112/69 113/65 102/68  Pulse: 64 68 75 72  Resp: 18 16 20    Temp:  98.3 F (36.8 C) 98.3 F (36.8 C)   TempSrc:  Oral Oral   SpO2: 94% 93% 94%   Weight:      Height:        GEN- The patient is well appearing, alert and oriented x 3 today.   HEENT: normocephalic, atraumatic; sclera clear, conjunctiva pink; hearing intact; oropharynx clear; neck supple, no JVP Lungs-  CTA b/l, normal work of breathing.  No wheezes, rales, rhonchi Heart- RRR, extrasystoles no murmurs, rubs or gallops, PMI not laterally displaced GI- soft, non-tender, non-distended Extremities- no clubbing, cyanosis, or edema MS- no significant deformity or atrophy Skin- warm and dry, no rash or lesion,  implant/removal sites are without hematoma/ecchymosis Psych- euthymic mood, full affect Neuro- no gross deficits   Labs:   Lab Results  Component Value Date   WBC 6.6 04/29/2023   HGB 12.8 (L) 04/29/2023   HCT 38.3 04/29/2023   MCV 93 04/29/2023   PLT 176 04/29/2023   No results for input(s): "NA", "K", "CL", "CO2", "BUN", "CREATININE", "CALCIUM", "PROT", "BILITOT", "ALKPHOS", "ALT", "AST", "GLUCOSE" in the last 168 hours.  Invalid input(s): "LABALBU"  Discharge Medications:  Allergies as of 05/07/2023       Reactions   Niacin Other (See Comments)   panic attack   Prednisone Other (See Comments)   Upset stomach         Medication List     TAKE these medications    Cholecalciferol 50 MCG (2000 UT) Tabs 2,000 Units in the  morning.   DULoxetine 30 MG capsule Commonly known as: CYMBALTA Take 30 mg by mouth in the morning and at bedtime.   Eliquis 5 MG Tabs tablet Generic drug: apixaban TAKE 1 TABLET BY MOUTH TWICE A DAY Notes to patient: Do NOT resume until 05/12/23   finasteride 5 MG tablet Commonly known as: PROSCAR Take 5 mg by mouth at bedtime.   LORazepam 0.5 MG tablet Commonly known as: ATIVAN Take 1 tablet (0.5 mg total) by mouth 3 (three) times daily as needed for  anxiety.   Magnesium 250 MG Tabs Take 250 mg by mouth in the morning.   metoprolol succinate 25 MG 24 hr tablet Commonly known as: TOPROL-XL Take 1 tablet (25 mg total) by mouth daily. Start taking on: May 08, 2023   mirtazapine 30 MG tablet Commonly known as: REMERON TAKE 1 TABLET BY MOUTH AT BEDTIME.   Repatha SureClick 140 MG/ML Soaj Generic drug: Evolocumab INJECT 1 PEN INTO THE SKIN EVERY 14 (FOURTEEN) DAYS.   rosuvastatin 40 MG tablet Commonly known as: CRESTOR TAKE 1 TABLET BY MOUTH EVERY DAY What changed: when to take this        Disposition: Home Discharge Instructions     Diet - low sodium heart healthy   Complete by: As directed    Increase activity slowly   Complete by: As directed         Duration of Discharge Encounter: Greater than 30 minutes including physician time.  Norma Fredrickson, PA-C 05/07/2023 10:55 AM

## 2023-05-08 ENCOUNTER — Telehealth: Payer: Self-pay

## 2023-05-08 NOTE — Telephone Encounter (Signed)
Follow-up after same day discharge: Implant date: 05/06/2023 MD: Steffanie Dunn Device: Medtronic Pacemaker Location: Left Chest   Wound check visit: 05/28/2023 90 day MD follow-up: 08/07/2023  Remote Transmission received:n/a  Dressing/sling removed: Yes  Confirm OAC restart on: yes  Please continue to monitor your cardiac device site for redness, swelling, and drainage. Call the device clinic at [phone] if you experience these symptoms, fever/chills, or have questions about your device.   Remote monitoring is used to monitor your cardiac device from home. This monitoring is scheduled every 91 days by our office. It allows [place] to keep an eye on the functioning of your device to ensure it is working properly.

## 2023-05-15 NOTE — Progress Notes (Signed)
Carelink Summary Report / Loop Recorder 

## 2023-05-25 ENCOUNTER — Ambulatory Visit: Payer: Medicare Other

## 2023-05-28 ENCOUNTER — Ambulatory Visit: Payer: Medicare Other | Attending: Cardiovascular Disease

## 2023-05-28 DIAGNOSIS — R001 Bradycardia, unspecified: Secondary | ICD-10-CM | POA: Diagnosis present

## 2023-05-28 DIAGNOSIS — I480 Paroxysmal atrial fibrillation: Secondary | ICD-10-CM | POA: Insufficient documentation

## 2023-05-28 LAB — CUP PACEART INCLINIC DEVICE CHECK
Date Time Interrogation Session: 20250102151944
Implantable Lead Connection Status: 753985
Implantable Lead Connection Status: 753985
Implantable Lead Implant Date: 20241211
Implantable Lead Implant Date: 20241211
Implantable Lead Location: 753859
Implantable Lead Location: 753860
Implantable Lead Model: 3830
Implantable Lead Model: 5076
Implantable Pulse Generator Implant Date: 20241211

## 2023-05-28 NOTE — Progress Notes (Signed)
 Wound check appointment. Steri-strips removed. Wound without redness or edema. Incision edges approximated, wound well healed. Normal device function. Thresholds, sensing, and impedances consistent with implant measurements. Device programmed at 3.5V/auto capture programmed on for extra safety margin until 3 month visit. Histogram distribution appropriate for patient and level of activity. No mode switches or high ventricular rates noted. Patient educated about wound care, arm mobility, lifting restrictions. ROV in 3 months with implanting physician.  ILR wound check in clinic. Steri strips removed. Wound well healed. Home monitor transmitting nightly. No episodes. Questions answered.

## 2023-05-28 NOTE — Patient Instructions (Signed)

## 2023-06-03 ENCOUNTER — Encounter: Payer: Self-pay | Admitting: Cardiology

## 2023-06-03 ENCOUNTER — Ambulatory Visit: Payer: Medicare Other | Admitting: Psychiatry

## 2023-06-06 ENCOUNTER — Encounter (INDEPENDENT_AMBULATORY_CARE_PROVIDER_SITE_OTHER): Payer: Self-pay

## 2023-06-08 ENCOUNTER — Encounter: Payer: Medicare Other | Admitting: Physician Assistant

## 2023-06-08 NOTE — Telephone Encounter (Signed)
 Called pt re my chart message and pt had ran out of Metoprolol  last dose was taken Sat   B/P today was 101/65 and HR 60 Per pt while on GXT for 45 minutes HR reached 104  Per pt would rather not take but will restart if Dr Cindie orders  Per pt uses [facility] not [facility]

## 2023-06-09 NOTE — Telephone Encounter (Signed)
 See above encounter. Patient will stop metoprolol per Dr. Lalla Brothers

## 2023-06-18 ENCOUNTER — Encounter: Payer: Self-pay | Admitting: Cardiology

## 2023-06-25 ENCOUNTER — Other Ambulatory Visit (INDEPENDENT_AMBULATORY_CARE_PROVIDER_SITE_OTHER): Payer: Medicare Other

## 2023-06-25 ENCOUNTER — Ambulatory Visit: Payer: Medicare Other | Admitting: Orthopaedic Surgery

## 2023-06-25 ENCOUNTER — Encounter: Payer: Self-pay | Admitting: Orthopaedic Surgery

## 2023-06-25 DIAGNOSIS — M25551 Pain in right hip: Secondary | ICD-10-CM

## 2023-06-25 DIAGNOSIS — M7061 Trochanteric bursitis, right hip: Secondary | ICD-10-CM

## 2023-06-25 NOTE — Progress Notes (Signed)
The patient is a very active 89 year old gentleman who has been having right hip pain for many years now but it has always been on the lateral aspect of his right hip.  He points to the trochanteric area.  He denies any groin pain.  He walks on a daily basis and he has times where the right hip hurts quite a bit.  The left hip is never hurt.  He says the pain comes and goes.  He has had steroid injections in the past over this area and the last time was about 6 months ago.  I was able to review all of his past medical history and medications within epic.  He tries to stay active.  He is a thin individual.  He is not a diabetic.  He is followed regularly by cardiology as well.  He walks with a normal-appearing gait.  There is no Trendelenburg aspect.  His right and left hips move fluidly and smoothly.  He has mainly pain over the right trochanteric area and tip of the trochanteric area.  Standing AP pelvis shows normal-appearing hips bilaterally.  I did show him stretching exercises that I want him to try twice daily with 3 sets each time once in the morning and once in the evening.  I offer him a steroid injection today but he said he would really rather try just the stretching exercises.  If this is not getting any better.  I gave him information for contacting my partner Dr.Bokshan for a follow-up appointment to consider an ultrasound-guided injection in this area versus other treatment modalities or options including even surgery.

## 2023-06-29 ENCOUNTER — Ambulatory Visit: Payer: Medicare Other

## 2023-07-13 ENCOUNTER — Ambulatory Visit: Payer: Medicare Other | Attending: Cardiology

## 2023-07-13 DIAGNOSIS — R001 Bradycardia, unspecified: Secondary | ICD-10-CM

## 2023-07-13 NOTE — Telephone Encounter (Signed)
Spoke to pt. I suspect some PVC's d/t pt saying on the phone his apple watch reads his HR and sometimes it jumps up to 120-130 and immediately goes back down. Of note, recently stopped metop. The other concern is in the mornings he says his HR, per watch, is sustained 37-40. Appointment made in device clinic to assess leads. Lastly, C/o inappropriate response to exercise; histo's are shifted left and could use some adjustment to RR.

## 2023-07-17 ENCOUNTER — Other Ambulatory Visit: Payer: Self-pay | Admitting: Physician Assistant

## 2023-07-19 LAB — CUP PACEART INCLINIC DEVICE CHECK
Battery Remaining Longevity: 167 mo
Battery Voltage: 3.21 V
Brady Statistic AP VP Percent: 8.9 %
Brady Statistic AP VS Percent: 61.24 %
Brady Statistic AS VP Percent: 0.01 %
Brady Statistic AS VS Percent: 29.86 %
Brady Statistic RA Percent Paced: 69.28 %
Brady Statistic RV Percent Paced: 8.91 %
Date Time Interrogation Session: 20250217103401
Implantable Lead Connection Status: 753985
Implantable Lead Connection Status: 753985
Implantable Lead Implant Date: 20241211
Implantable Lead Implant Date: 20241211
Implantable Lead Location: 753859
Implantable Lead Location: 753860
Implantable Lead Model: 3830
Implantable Lead Model: 5076
Implantable Pulse Generator Implant Date: 20241211
Lead Channel Impedance Value: 323 Ohm
Lead Channel Impedance Value: 361 Ohm
Lead Channel Impedance Value: 494 Ohm
Lead Channel Impedance Value: 494 Ohm
Lead Channel Pacing Threshold Amplitude: 0.625 V
Lead Channel Pacing Threshold Amplitude: 1 V
Lead Channel Pacing Threshold Pulse Width: 0.4 ms
Lead Channel Pacing Threshold Pulse Width: 0.4 ms
Lead Channel Sensing Intrinsic Amplitude: 23.875 mV
Lead Channel Sensing Intrinsic Amplitude: 23.875 mV
Lead Channel Sensing Intrinsic Amplitude: 3.125 mV
Lead Channel Sensing Intrinsic Amplitude: 3.125 mV
Lead Channel Setting Pacing Amplitude: 1.5 V
Lead Channel Setting Pacing Amplitude: 2 V
Lead Channel Setting Pacing Pulse Width: 0.4 ms
Lead Channel Setting Sensing Sensitivity: 1.2 mV
Zone Setting Status: 755011

## 2023-07-19 NOTE — Addendum Note (Signed)
 Addended by: Skip Mayer on: 07/19/2023 09:11 AM   Modules accepted: Orders

## 2023-07-21 ENCOUNTER — Encounter: Payer: Self-pay | Admitting: Cardiology

## 2023-08-03 ENCOUNTER — Ambulatory Visit: Payer: Medicare Other

## 2023-08-06 ENCOUNTER — Other Ambulatory Visit: Payer: Self-pay | Admitting: Internal Medicine

## 2023-08-06 ENCOUNTER — Ambulatory Visit: Payer: Medicare Other

## 2023-08-06 DIAGNOSIS — K8689 Other specified diseases of pancreas: Secondary | ICD-10-CM

## 2023-08-06 DIAGNOSIS — R001 Bradycardia, unspecified: Secondary | ICD-10-CM | POA: Diagnosis not present

## 2023-08-06 LAB — CUP PACEART REMOTE DEVICE CHECK
Battery Remaining Longevity: 165 mo
Battery Voltage: 3.21 V
Brady Statistic AP VP Percent: 0.02 %
Brady Statistic AP VS Percent: 78.48 %
Brady Statistic AS VP Percent: 0.01 %
Brady Statistic AS VS Percent: 21.49 %
Brady Statistic RA Percent Paced: 77.56 %
Brady Statistic RV Percent Paced: 0.03 %
Date Time Interrogation Session: 20250312235632
Implantable Lead Connection Status: 753985
Implantable Lead Connection Status: 753985
Implantable Lead Implant Date: 20241211
Implantable Lead Implant Date: 20241211
Implantable Lead Location: 753859
Implantable Lead Location: 753860
Implantable Lead Model: 3830
Implantable Lead Model: 5076
Implantable Pulse Generator Implant Date: 20241211
Lead Channel Impedance Value: 323 Ohm
Lead Channel Impedance Value: 342 Ohm
Lead Channel Impedance Value: 437 Ohm
Lead Channel Impedance Value: 494 Ohm
Lead Channel Pacing Threshold Amplitude: 0.5 V
Lead Channel Pacing Threshold Amplitude: 1 V
Lead Channel Pacing Threshold Pulse Width: 0.4 ms
Lead Channel Pacing Threshold Pulse Width: 0.4 ms
Lead Channel Sensing Intrinsic Amplitude: 2.125 mV
Lead Channel Sensing Intrinsic Amplitude: 2.125 mV
Lead Channel Sensing Intrinsic Amplitude: 23.25 mV
Lead Channel Sensing Intrinsic Amplitude: 23.25 mV
Lead Channel Setting Pacing Amplitude: 1.5 V
Lead Channel Setting Pacing Amplitude: 2 V
Lead Channel Setting Pacing Pulse Width: 0.4 ms
Lead Channel Setting Sensing Sensitivity: 1.2 mV
Zone Setting Status: 755011

## 2023-08-06 NOTE — Progress Notes (Unsigned)
 Cardiology Office Note Date:  08/06/2023  Patient ID:  Derrick Compton, Derrick Compton 12/15/1934, MRN [id number] PCP:  Mechele Claude, MD  Cardiologist:  Dr. Allyson Sabal Electrophysiologist: Dr. Johney Frame >> Dr. Lalla Brothers     Chief Complaint:  *** 91 day post implant visit  History of Present Illness: Derrick Compton is a 89 y.o. male with history of CAD, AFib, aortic aneurysm  Seen previously by Dr. Johney Frame, I have seen him in the past as well More recently  Saw B. Ollis, NP 04/09/23,  ILR noted on 02/16/23 at 1323 lasting 5 seconds > ECG with CHB with escape beats up to 8 sec pauses. He was asymptomatic with event. Dr. Lalla Brothers aware and watchful waiting.  At his visit he reported recently started on new psychiatric medications and became very dizzy on Trintellix and stopped it ~ 1.5 weeks ago with resolution of symptoms.  No changes made  Saw Dr. Lalla Brothers Dec 2024, reported fluctuating HRs, some dizziness, feeling funny, discussed/planned for PPM for symptomatic bradycardia  PPM implanted 05/06/23,(loop removed) TOPROL low dose was added for frequent PACs   *** site *** chronic outputs *** AFib? *** eliquis, dose, bleeding, labs *** symptoms *** due for CT ao survelleince  AFib/AAD hx Diagnosed Jan 2021 Has sought second opinion at [facility] clinic  PVI ablation 11/10/2019, Dr. Marguarite Arbour Dr. Lalla Brothers Sept 2021 for watchman evaluation, pt wanting to come off a/c secondary to very active lifestyle, hiking/race care activities (?), felt to be a candidate for the procedure, pt was going to f/u with Dr. Johney Frame and make a decision ILR implanted 03/28/2020  I do not find any AAD hx  Device  MDT LINQ II implanted 03/28/2020 for Afib surveillance MDT dual chamber PPM implanted 05/06/23 (loop removed)   Past Medical History:  Diagnosis Date   Anxiety    BPH (benign prostatic hyperplasia)    CAD (coronary artery disease)    Chest pain    Hyperlipidemia    Osteopenia    Paroxysmal atrial  fibrillation (HCC)     Past Surgical History:  Procedure Laterality Date   ATRIAL FIBRILLATION ABLATION N/A 11/10/2019   Procedure: ATRIAL FIBRILLATION ABLATION;  Surgeon: Hillis Range, MD;  Location: [facility] INVASIVE CV LAB;  Service: Cardiovascular;  Laterality: N/A;   implantable loop recorder placement  03/28/2020    Medtronic Reveal Linq model LNQ 22 (SN [id number] ) implantable loop recorder implanted for afib management   LEFT HEART CATH AND CORONARY ANGIOGRAPHY N/A 02/05/2017   Procedure: LEFT HEART CATH AND CORONARY ANGIOGRAPHY;  Surgeon: Runell Gess, MD;  Location: [facility] INVASIVE CV LAB;  Service: Cardiovascular;  Laterality: N/A;   LOOP RECORDER REMOVAL Left 05/06/2023   Procedure: LOOP RECORDER REMOVAL;  Surgeon: Lanier Prude, MD;  Location: [facility] INVAS[facility] CV LAB;  Service: Cardiovascular;  Laterality: Left;   PACEMAKER IMPLANT N/A 05/06/2023   Procedure: PACEMAKER IMPLANT;  Surgeon: Lanier Prude, MD;  Location: [facility] INVASIVE CV LAB;  Service: Cardiovascular;  Laterality: N/A;    Current Outpatient Medications  Medication Sig Dispense Refill   Cholecalciferol 50 MCG (2000 UT) TABS 2,000 Units in the morning.     DULoxetine (CYMBALTA) 30 MG capsule Take 30 mg by mouth in the morning and at bedtime.     ELIQUIS 5 MG TABS tablet TAKE 1 TABLET BY MOUTH TWICE A DAY 180 tablet 1   Evolocumab (REPATHA SURECLICK) 140 MG/ML SOAJ INJECT 1 PEN INTO THE SKIN EVERY 14 (FOURTEEN) DAYS. 6 mL 3  finasteride (PROSCAR) 5 MG tablet Take 5 mg by mouth at bedtime.     LORazepam (ATIVAN) 0.5 MG tablet Take 1 tablet (0.5 mg total) by mouth 3 (three) times daily as needed for anxiety. 90 tablet 0   Magnesium 250 MG TABS Take 250 mg by mouth in the morning.     mirtazapine (REMERON) 30 MG tablet TAKE 1 TABLET BY MOUTH AT BEDTIME. 90 tablet 1   rosuvastatin (CRESTOR) 40 MG tablet TAKE 1 TABLET BY MOUTH EVERY DAY 90 tablet 3   No current facility-administered medications for this visit.     Allergies:   Niacin and Prednisone   Social History:  The patient  reports that he has never smoked. He has never used smokeless tobacco. He reports current alcohol use of about 2.0 standard drinks of alcohol per week. He reports that he does not use drugs.   Family History:  The patient's family history is not on file.  ROS:  Please see the history of present illness.    All other systems are reviewed and otherwise negative.   PHYSICAL EXAM:  VS:  There were no vitals taken for this visit. BMI: There is no height or weight on file to calculate BMI. Well nourished, well developed, in no acute distress HEENT: normocephalic, atraumatic Neck: no JVD, carotid bruits or masses Cardiac:   regularly irregular in a bigeminal pattern, no significant murmurs, no rubs, or gallops Lungs:  CTA b/l, no wheezing, rhonchi or rales Abd: soft, nontender MS: no deformity, age appropriate atrophy Ext:  no edema Skin: warm and dry, no rash Neuro:  No gross deficits appreciated Psych: euthymic mood, full affect  ILR site is stable, no tethering or discomfort   EKG:  not done today  Device interrogation done today and reviewed by myself:  *** battery and lead measurements stable ***     Cardiac PET 06/2022 > no ischemia, no infarction. Normal perfusion, intermediate risk due to borderline reduced myocardial blood flow reserve, LVEF 39% at rest / stress 40%. Moderate coronary calcifications   03/14/2021: TTE  1. Left ventricular ejection fraction, by estimation, is 50 to 55%. The  left ventricle has low normal function. The left ventricle has no regional  wall motion abnormalities. Left ventricular diastolic parameters are  consistent with Grade I diastolic  dysfunction (impaired relaxation).   2. Right ventricular systolic function is normal. The right ventricular  size is mildly enlarged.   3. Left atrial size was mildly dilated.   4. The mitral valve is normal in structure. No evidence  of mitral valve  regurgitation. No evidence of mitral stenosis.   5. The aortic valve is tricuspid. There is moderate calcification of the  aortic valve. There is moderate thickening of the aortic valve. Aortic  valve regurgitation is not visualized. Aortic valve  sclerosis/calcification is present, without any evidence  of aortic stenosis. Aortic valve area, by VTI measures 2.76 cm. Aortic  valve mean gradient measures 5.2 mmHg. Aortic valve Vmax measures 1.58  m/s.   6. Aortic dilatation noted. There is mild dilatation of the ascending  aorta, measuring 42 mm.   7. The inferior vena cava is normal in size with greater than 50%  respiratory variability, suggesting right atrial pressure of 3 mmHg.   Comparison(s): Prior images reviewed side by side.   12/10/2019: EPS/ablation CONCLUSIONS: 1. Sinus rhythm upon presentation.   2. Intracardiac echo reveals a moderate sized left atrium with four separate pulmonary veins without evidence of pulmonary  vein stenosis. 3. Successful electrical isolation and anatomical encircling of all four pulmonary veins with radiofrequency current. 4. No inducible arrhythmias following ablation both on and off of Isuprel 5. No early apparent complications.  02/05/2017: LHC Ost 1st Diag lesion, 95 %stenosed. The left ventricular systolic function is normal. LV end diastolic pressure is normal. The left ventricular ejection fraction is 55-65% by visual estimate. IMPRESSION:Derrick Compton has a high-grade ostial moderate size first diagonal branch stenosis. It is not ideal for stenting. The vessel comes off the 90 angle. He could potentially undergo cutting balloon atherectomy however given the relatively mild nature of his symptoms, the fact that he is on no antianginals and his Myoview is negative I'm going to initially treat him medically. Should he feel optimal medical antianginal therapy I will put him back in intravenous first diagonal branch ostial lesion. The  sheath was removed and a TR band was placed on the right wrist to achieve patent hemostasis. The patient left the lab in stable condition. I am going to begin  Dual antiplatelet therapy and we'll see him back in one week for follow-up.   Recent Labs: 04/29/2023: BUN 23; Creatinine, Ser 0.98; Hemoglobin 12.8; Platelets 176; Potassium 4.3; Sodium 141  No results found for requested labs within last 365 days.   CrCl cannot be calculated (Patient's most recent lab result is older than the maximum 21 days allowed.).   Wt Readings from Last 3 Encounters:  05/06/23 150 lb (68 kg)  04/29/23 153 lb (69.4 kg)  04/20/23 152 lb (68.9 kg)     Other studies reviewed: Additional studies/records reviewed today include: summarized above  ASSESSMENT AND PLAN:  PPM *** well healed *** intact function *** no programming changes made  Paroxysmal Afib CHA2DS2Vasc is 3, on eliquis, *** appropriately dosed  ***  % burden  3.    CAD ***  4. Ascending Ao aneurysm ***  Disposition: ***  Current medicines are reviewed at length with the patient today.  The patient did not have any concerns regarding medicines.  Norma Fredrickson, PA-C 08/06/2023 7:57 AM     [facility] [address] [place] [address] [phone] (office)  [phone] (fax)

## 2023-08-07 ENCOUNTER — Ambulatory Visit: Payer: Medicare Other | Attending: Physician Assistant | Admitting: Physician Assistant

## 2023-08-07 ENCOUNTER — Encounter: Payer: Self-pay | Admitting: Physician Assistant

## 2023-08-07 VITALS — BP 110/64 | HR 62 | Ht 70.0 in | Wt 156.4 lb

## 2023-08-07 DIAGNOSIS — Z950 Presence of cardiac pacemaker: Secondary | ICD-10-CM | POA: Insufficient documentation

## 2023-08-07 DIAGNOSIS — I2510 Atherosclerotic heart disease of native coronary artery without angina pectoris: Secondary | ICD-10-CM | POA: Diagnosis present

## 2023-08-07 DIAGNOSIS — I480 Paroxysmal atrial fibrillation: Secondary | ICD-10-CM | POA: Insufficient documentation

## 2023-08-07 DIAGNOSIS — R079 Chest pain, unspecified: Secondary | ICD-10-CM | POA: Insufficient documentation

## 2023-08-07 LAB — CUP PACEART INCLINIC DEVICE CHECK
Battery Remaining Longevity: 167 mo
Battery Voltage: 3.21 V
Brady Statistic AP VP Percent: 0.02 %
Brady Statistic AP VS Percent: 77.81 %
Brady Statistic AS VP Percent: 0.01 %
Brady Statistic AS VS Percent: 22.16 %
Brady Statistic RA Percent Paced: 76.87 %
Brady Statistic RV Percent Paced: 0.03 %
Date Time Interrogation Session: 20250314134854
Implantable Lead Connection Status: 753985
Implantable Lead Connection Status: 753985
Implantable Lead Implant Date: 20241211
Implantable Lead Implant Date: 20241211
Implantable Lead Location: 753859
Implantable Lead Location: 753860
Implantable Lead Model: 3830
Implantable Lead Model: 5076
Implantable Pulse Generator Implant Date: 20241211
Lead Channel Impedance Value: 323 Ohm
Lead Channel Impedance Value: 380 Ohm
Lead Channel Impedance Value: 513 Ohm
Lead Channel Impedance Value: 532 Ohm
Lead Channel Pacing Threshold Amplitude: 0.5 V
Lead Channel Pacing Threshold Amplitude: 1 V
Lead Channel Pacing Threshold Pulse Width: 0.4 ms
Lead Channel Pacing Threshold Pulse Width: 0.4 ms
Lead Channel Sensing Intrinsic Amplitude: 2.125 mV
Lead Channel Sensing Intrinsic Amplitude: 2.75 mV
Lead Channel Sensing Intrinsic Amplitude: 23.625 mV
Lead Channel Sensing Intrinsic Amplitude: 26.625 mV
Lead Channel Setting Pacing Amplitude: 1.5 V
Lead Channel Setting Pacing Amplitude: 2 V
Lead Channel Setting Pacing Pulse Width: 0.4 ms
Lead Channel Setting Sensing Sensitivity: 1.2 mV
Zone Setting Status: 755011

## 2023-08-07 NOTE — Patient Instructions (Signed)
 Medication Instructions:   Your physician recommends that you continue on your current medications as directed. Please refer to the Current Medication list given to you today.   *If you need a refill on your cardiac medications before your next appointment, please call your pharmacy*    Lab Work: NONE ORDERED  TODAY     If you have labs (blood work) drawn today and your tests are completely normal, you will receive your results only by: MyChart Message (if you have MyChart) OR A paper copy in the mail If you have any lab test that is abnormal or we need to change your treatment, we will call you to review the results.   Testing/Procedures: NONE ORDERED  TODAY     Follow-Up: At [facility], you and your health needs are our priority.  As part of our continuing mission to provide you with exceptional heart care, we have created designated Provider Care Teams.  These Care Teams include your primary Cardiologist (physician) and Advanced Practice Providers (APPs -  Physician Assistants and Nurse Practitioners) who all work together to provide you with the care you need, when you need it.  We recommend signing up for the patient portal called "MyChart".  Sign up information is provided on this After Visit Summary.  MyChart is used to connect with patients for Virtual Visits (Telemedicine).  Patients are able to view lab/test results, encounter notes, upcoming appointments, etc.  Non-urgent messages can be sent to your provider as well.   To learn more about what you can do with MyChart, go to [email or web address].     Your next appointment:    6 month(s)   Provider:    You may see Dr. Lalla Brothers  or one of the following Advanced Practice Providers on your designated Care Team:   Francis Dowse, PA-C  Other Instructions        1st Floor: - Lobby - Registration  - Pharmacy  - Lab - Cafe  2nd Floor: - PV Lab - Diagnostic Testing (echo, CT, nuclear med)  3rd  Floor: - Vacant  4th Floor: - TCTS (cardiothoracic surgery) - AFib Clinic - Structural Heart Clinic - Vascular Surgery  - Vascular Ultrasound  5th Floor: - HeartCare Cardiology (general and EP) - Clinical Pharmacy for coumadin, hypertension, lipid, weight-loss medications, and med management appointments    Valet parking services will be available as well.

## 2023-08-09 ENCOUNTER — Encounter: Payer: Self-pay | Admitting: Cardiology

## 2023-08-13 ENCOUNTER — Telehealth: Payer: Self-pay | Admitting: Cardiovascular Disease

## 2023-08-13 ENCOUNTER — Other Ambulatory Visit: Payer: Self-pay | Admitting: Urology

## 2023-08-13 NOTE — Telephone Encounter (Signed)
     Primary Cardiologist: Nanetta Batty, MD  Clinical pharmacist reviewed chart  as part of pre-operative protocol coverage.  The following recommendations were provided for , Derrick Compton :  Procedure: Ureteroscopy with Laser Lithotripsy and Stent Placement  Date of procedure: 08/24/23     CHA2DS2-VASc Score = 3   This indicates a 3.2% annual risk of stroke. The patient's score is based upon: CHF History: 0 HTN History: 0 Diabetes History: 0 Stroke History: 0 Vascular Disease History: 1 Age Score: 2 Gender Score: 0       CrCl 59 ml/min Platelet count 176   Per office protocol, patient can hold Eliquis for 2 days prior to procedure.   I will route this recommendation to the requesting party via Epic fax function and remove from pre-op pool.  Please call with questions.  [name]. Ziyon Soltau NP-C     08/13/2023, 4:03 PM [facility] [address] Office [phone] Fax [phone]

## 2023-08-13 NOTE — Telephone Encounter (Signed)
   Pre-operative Risk Assessment    Patient Name: Derrick Compton  DOB: 24-Feb-1935 MRN: [id number]   Date of last office visit:  Date of next office visit:     Request for Surgical Clearance    Procedure:  Ureteroscopy with Laser Lithotripsy and Stent Placement  Date of Surgery:  08-24-23                                 Surgeon:  Dr Modena Slater Surgeon's Group or Practice Name:    Phone number:  [phone] Fax number:  [phone]   Type of Clearance Requested:   - Pharmacy:  Hold Apixaban (Eliquis)     Type of Anesthesia:  General    Additional requests/questions:    Rivka Safer   08/13/2023, 2:29 PM

## 2023-08-13 NOTE — Telephone Encounter (Signed)
 Patient with diagnosis of afib on Eliquis for anticoagulation.    Procedure: Ureteroscopy with Laser Lithotripsy and Stent Placement  Date of procedure: 08/24/23   CHA2DS2-VASc Score = 3   This indicates a 3.2% annual risk of stroke. The patient's score is based upon: CHF History: 0 HTN History: 0 Diabetes History: 0 Stroke History: 0 Vascular Disease History: 1 Age Score: 2 Gender Score: 0      CrCl 59 ml/min Platelet count 176  Per office protocol, patient can hold Eliquis for 2 days prior to procedure.    **This guidance is not considered finalized until pre-operative APP has relayed final recommendations.**

## 2023-08-17 NOTE — Patient Instructions (Addendum)
 DUE TO COVID-19 ONLY TWO VISITORS  (aged 87 and older)  ARE ALLOWED TO COME WITH YOU AND STAY IN THE WAITING ROOM ONLY DURING PRE OP AND PROCEDURE.   **NO VISITORS ARE ALLOWED IN THE SHORT STAY AREA OR RECOVERY ROOM!!**  IF YOU WILL BE ADMITTED INTO THE HOSPITAL YOU ARE ALLOWED ONLY FOUR SUPPORT PEOPLE DURING VISITATION HOURS ONLY (7 AM -8PM)   The support person(s) must pass our screening, gel in and out, and wear a mask at all times, including in the patient's room. Patients must also wear a mask when staff or their support person are in the room. Visitors [place] BADGE MUST BE WORN VISIBLY  One adult visitor may remain with you overnight and MUST be in the room by 8 P.M.     Your procedure is scheduled on: 08/24/23   Report to [facility] Main Entrance    Report to admitting at: 5:15 AM   Call this number if you have problems the morning of surgery [phone]   Do not eat food or drink: After Midnight.   FOLLOW ANY ADDITIONAL PRE OP INSTRUCTIONS YOU RECEIVED FROM YOUR SURGEON'S OFFICE!!!   Oral Hygiene is also important to reduce your risk of infection.                                    Remember - BRUSH YOUR TEETH THE MORNING OF SURGERY WITH YOUR REGULAR TOOTHPASTE  DENTURES WILL BE REMOVED PRIOR TO SURGERY PLEASE DO NOT APPLY "Poly grip" OR ADHESIVES!!!   Do NOT smoke after Midnight   Take these medicines the morning of surgery with A SIP OF WATER: escitalopram. Lorazepam as needed. These are anesthesia recommendations for holding your anticoagulants.  Please contact your prescribing physician to confirm IF it is safe to hold your anticoagulants for this length of time.   Eliquis Apixaban   72 hours   Xarelto Rivaroxaban   72 hours  Plavix Clopidogrel   120 hours  Pletal Cilostazol   120 hours  HOLD Eliquis after: 08/21/23                   You may not have any metal on your body including hair pins, jewelry, and body piercing             Do not wear lotions,  powders, perfumes/cologne, or deodorant              Men may shave face and neck.   Do not bring valuables to the hospital. [facility] IS NOT             RESPONSIBLE   FOR VALUABLES.   Contacts, glasses, or bridgework may not be worn into surgery.   Bring small overnight bag day of surgery.   DO NOT BRING YOUR HOME MEDICATIONS TO THE HOSPITAL. PHARMACY WILL DISPENSE MEDICATIONS LISTED ON YOUR MEDICATION LIST TO YOU DURING YOUR ADMISSION IN THE HOSPITAL!    Patients discharged on the day of surgery will not be allowed to drive home.  Someone NEEDS to stay with you for the first 24 hours after anesthesia.   Special Instructions: Bring a copy of your healthcare power of attorney and living will documents         the day of surgery if you haven't scanned them before.              Please read over  the following fact sheets you were given: IF YOU HAVE QUESTIONS ABOUT YOUR PRE-OP INSTRUCTIONS PLEASE CALL [phone]    [facility] - Preparing for Surgery Before surgery, you can play an important role.  Because skin is not sterile, your skin needs to be as free of germs as possible.  You can reduce the number of germs on your skin by washing with CHG (chlorahexidine gluconate) soap before surgery.  CHG is an antiseptic cleaner which kills germs and bonds with the skin to continue killing germs even after washing. Please DO NOT use if you have an allergy to CHG or antibacterial soaps.  If your skin becomes reddened/irritated stop using the CHG and inform your nurse when you arrive at Short Stay. Do not shave (including legs and underarms) for at least 48 hours prior to the first CHG shower.  You may shave your face/neck. Please follow these instructions carefully:  1.  Shower with CHG Soap the night before surgery and the  morning of Surgery.  2.  If you choose to wash your hair, wash your hair first as usual with your  normal  shampoo.  3.  After you shampoo, rinse your hair and body thoroughly  to remove the  shampoo.                           4.  Use CHG as you would any other liquid soap.  You can apply chg directly  to the skin and wash                       Gently with a scrungie or clean washcloth.  5.  Apply the CHG Soap to your body ONLY FROM THE NECK DOWN.   Do not use on face/ open                           Wound or open sores. Avoid contact with eyes, ears mouth and genitals (private parts).                       Wash face,  Genitals (private parts) with your normal soap.             6.  Wash thoroughly, paying special attention to the area where your surgery  will be performed.  7.  Thoroughly rinse your body with warm water from the neck down.  8.  DO NOT shower/wash with your normal soap after using and rinsing off  the CHG Soap.                9.  Pat yourself dry with a clean towel.            10.  Wear clean pajamas.            11.  Place clean sheets on your bed the night of your first shower and do not  sleep with pets. Day of Surgery : Do not apply any lotions/deodorants the morning of surgery.  Please wear clean clothes to the hospital/surgery center.  FAILURE TO FOLLOW THESE INSTRUCTIONS MAY RESULT IN THE CANCELLATION OF YOUR SURGERY PATIENT SIGNATURE_________________________________  NURSE SIGNATURE__________________________________  ________________________________________________________________________

## 2023-08-18 ENCOUNTER — Encounter: Payer: Self-pay | Admitting: Cardiology

## 2023-08-18 NOTE — Progress Notes (Signed)
 PERIOPERATIVE PRESCRIPTION FOR IMPLANTED CARDIAC DEVICE PROGRAMMING  Patient Information: Name:  Derrick Compton  DOB:  [birth year]  MRN:  [id number]    Planned Procedure:  CYSTOSCOPY/URETEROSCOPY/HOLMIUM LASER/STENT PLACEMENT - Left  Surgeon:  Dr. Modena Slater  Date of Procedure:  08/24/23  Cautery will be used.  Position during surgery:    Please send documentation back to:  [facility] [phone] # [phone])  Device Information:  Clinic EP Physician:  Dr. Steffanie Dunn   Device Type:  Pacemaker Manufacturer and Phone #:  Medtronic: [id number] Pacemaker Dependent?:  No. Date of Last Device Check:  08/07/2023 Normal Device Function?:  Yes.    Electrophysiologist's Recommendations:  Have magnet available. Provide continuous ECG monitoring when magnet is used or reprogramming is to be performed.  Procedure may interfere with device function.  Magnet should be placed over device during procedure.  Per Device Clinic [address], [place], [place]  11:21 AM 08/18/2023

## 2023-08-19 ENCOUNTER — Encounter (HOSPITAL_COMMUNITY)
Admission: RE | Admit: 2023-08-19 | Discharge: 2023-08-19 | Disposition: A | Discharge status: Home / Self Care | Source: Ambulatory Visit | Attending: Urology | Admitting: Urology

## 2023-08-19 ENCOUNTER — Encounter (HOSPITAL_COMMUNITY): Payer: Self-pay

## 2023-08-19 ENCOUNTER — Other Ambulatory Visit: Payer: Self-pay

## 2023-08-19 VITALS — BP 105/64 | HR 70 | Temp 98.0°F | Ht 70.0 in | Wt 149.0 lb

## 2023-08-19 DIAGNOSIS — Z950 Presence of cardiac pacemaker: Secondary | ICD-10-CM | POA: Insufficient documentation

## 2023-08-19 DIAGNOSIS — I2510 Atherosclerotic heart disease of native coronary artery without angina pectoris: Secondary | ICD-10-CM | POA: Diagnosis not present

## 2023-08-19 DIAGNOSIS — I447 Left bundle-branch block, unspecified: Secondary | ICD-10-CM | POA: Insufficient documentation

## 2023-08-19 DIAGNOSIS — Z01812 Encounter for preprocedural laboratory examination: Secondary | ICD-10-CM | POA: Insufficient documentation

## 2023-08-19 DIAGNOSIS — Z7901 Long term (current) use of anticoagulants: Secondary | ICD-10-CM | POA: Diagnosis not present

## 2023-08-19 DIAGNOSIS — I480 Paroxysmal atrial fibrillation: Secondary | ICD-10-CM | POA: Insufficient documentation

## 2023-08-19 DIAGNOSIS — I7121 Aneurysm of the ascending aorta, without rupture: Secondary | ICD-10-CM | POA: Insufficient documentation

## 2023-08-19 HISTORY — DX: Depression, unspecified: F32.A

## 2023-08-19 HISTORY — DX: Pneumonia, unspecified organism: J18.9

## 2023-08-19 HISTORY — DX: Presence of cardiac pacemaker: Z95.0

## 2023-08-19 HISTORY — DX: Cardiac arrhythmia, unspecified: I49.9

## 2023-08-19 HISTORY — DX: Personal history of urinary calculi: Z87.442

## 2023-08-19 HISTORY — DX: Angina pectoris, unspecified: I20.9

## 2023-08-19 LAB — CBC
HCT: 40.8 % (ref 39.0–52.0)
Hemoglobin: 13.1 g/dL (ref 13.0–17.0)
MCH: 30.9 pg (ref 26.0–34.0)
MCHC: 32.1 g/dL (ref 30.0–36.0)
MCV: 96.2 fL (ref 80.0–100.0)
Platelets: 195 10*3/uL (ref 150–400)
RBC: 4.24 MIL/uL (ref 4.22–5.81)
RDW: 13.2 % (ref 11.5–15.5)
WBC: 5 10*3/uL (ref 4.0–10.5)
nRBC: 0 % (ref 0.0–0.2)

## 2023-08-19 LAB — BASIC METABOLIC PANEL
Anion gap: 10 (ref 5–15)
BUN: 22 mg/dL (ref 8–23)
CO2: 27 mmol/L (ref 22–32)
Calcium: 9.5 mg/dL (ref 8.9–10.3)
Chloride: 103 mmol/L (ref 98–111)
Creatinine, Ser: 0.93 mg/dL (ref 0.61–1.24)
GFR, Estimated: >60 mL/min (ref >60)
Glucose, Bld: 112 mg/dL — ABNORMAL HIGH (ref 70–99)
Potassium: 3.9 mmol/L (ref 3.5–5.1)
Sodium: 140 mmol/L (ref 135–145)

## 2023-08-19 NOTE — Progress Notes (Addendum)
 For Anesthesia: PCP - Mechele Claude, MD  Cardiologist - Runell Gess, MD  Electrophysiologist: Dr. Johney Frame  Bowel Prep reminder:  Chest x-ray - 05/07/23 EKG - 08/07/23 Stress Test -  ECHO -  Cardiac Cath - 02/05/17 Pacemaker/ICD device last checked:08/07/23 Pacemaker orders received:Yes: EPIC Device Rep notified:Yes: Shari Prows Fairly  Spinal Cord Stimulator:N/A  Sleep Study - N/A CPAP -   Fasting Blood Sugar - N/A Checks Blood Sugar _____ times a day Date and result of last Hgb A1c-  Last dose of GLP1 agonist- N/A GLP1 instructions:   Last dose of SGLT-2 inhibitors- N/A SGLT-2 instructions:   Blood Thinner Instructions:Eliquis: will be on hold after 08/21/23 Aspirin Instructions: Last Dose:  Activity level: Can go up a flight of stairs and activities of daily living without stopping and without chest pain and/or shortness of breath   Able to exercise without chest pain and/or shortness of breath  Anesthesia review: Hx: CAD,Afib,Pacemaker  Patient denies shortness of breath, fever, cough and chest pain at PAT appointment   Patient verbalized understanding of instructions that were given to them at the PAT appointment. Patient was also instructed that they will need to review over the PAT instructions again at home before surgery.

## 2023-08-20 NOTE — Progress Notes (Signed)
 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  Nuclear stress test 03/16/2023 was negative for ischemia.  Echo 12/25/2022 showed EF 50%, no significant valvular abnormalities.  Cardiac stress PET 07/23/2022 showed normal perfusion. Last seen by Francis Dowse, PA-C on 08/07/2023, and noted to be doing well at that time, exercising regularly at the gym.  Per cardiology, patient can hold Eliquis 2 days prior to procedure.    Patient reports last dose Eliquis 08/21/2023.  Preop labs reviewed, WNL.  EKG 08/07/2023: Atrial paced rhythm.  Rate 65.  Perioperative prescription for implanted cardiac device programming per progress note 08/18/2023: Device Information:   Clinic EP Physician:  Dr. Steffanie Dunn    Device Type:  Pacemaker Manufacturer and Phone #:  Medtronic: [phone] Pacemaker Dependent?:  No. Date of Last Device Check:  08/07/2023     Normal Device Function?:  Yes.     Electrophysiologist's Recommendations:   Have magnet available. Provide continuous ECG monitoring when magnet is used or reprogramming is to be performed.  Procedure may interfere with device function.  Magnet should be placed over device during procedure.  Nuclear stress test 03/16/2023 (Care Everywhere): FINAL COMMENTS   - Myocardial perfusion imaging is normal. No evidence of regadenoson-inducible ischemia.   - Borderline reduced left ventricular ejection fraction. Apical/ septal dyskinesis, likely related to left bundle branch block.   TTE 12/25/2022 (Care Everywhere): INTERPRETATION ---------------------------------------------------------------    MILD LV DYSFUNCTION (See above) WITH MILD LVH    NORMAL RIGHT VENTRICULAR SYSTOLIC FUNCTION    VALVULAR REGURGITATION: MILD AR, MILD MR, TRIVIAL PR, TRIVIAL TR    NO VALVULAR STENOSIS     FREQUENT ECTOPY, SOME IMAGES TECHNCIALLY DIFFICULT, PT HAS CARDIAC MONITOR    DEVICE UNDER SKIN, DID NOT INTEFERE W/ IMAGING. CONTRACTILITY REDUCED    WITH VENTRICULAR ECTOPIC BEATS    NO PRIOR STUDY FOR COMPARISON   Cardiac PET stress 07/23/2022:  Findings are consistent with no ischemia and no infarction. Normal perfusion. The study is intermediate risk due to borderline reduced myocardial blood flow reserve, mildly reduced calculated ejection fraction, and no significant EF augmentation with stress. Consider correlation with echocardiography for EF.   No ST deviation was noted. Arrhythmias during stress: occasional PACs. Arrhythmias during recovery: frequent PACs.   LV perfusion is normal. There is no evidence of ischemia. There is no evidence of infarction.   Rest left ventricular function is abnormal. Rest EF: 39 %. Stress left ventricular function is abnormal. Stress EF: 40 %. End diastolic cavity size is normal. End systolic cavity size is normal. Ejection fraction at rest and stress may be impacted by suboptimal gating with frequent ectopy, and may not be accurate. Correlate with echo.   Myocardial blood flow was computed to be 0.16ml/g/min at rest and 1.49ml/g/min at stress. Global myocardial blood flow reserve was 1.97 and was mildly abnormal.   Coronary calcium was present on the attenuation correction CT images. Moderate coronary calcifications were present. Coronary calcifications were present in the left anterior descending artery, left circumflex artery and right coronary artery distribution(s).    Zannie Cove [facility] Short Stay Center/Anesthesiology Phone [phone] 08/20/2023 10:48 AM

## 2023-08-20 NOTE — Anesthesia Preprocedure Evaluation (Addendum)
 Anesthesia Evaluation  Patient identified by MRN, date of birth, ID band Patient awake    Reviewed: Allergy & Precautions, H&P , NPO status , Patient's Chart, lab work & pertinent test results  Airway Mallampati: II   Neck ROM: full    Dental   Pulmonary    breath sounds clear to auscultation       Cardiovascular + angina  + CAD  + dysrhythmias Atrial Fibrillation + pacemaker  Rhythm:regular Rate:Normal     Neuro/Psych  PSYCHIATRIC DISORDERS Anxiety Depression     Neuromuscular disease    GI/Hepatic   Endo/Other    Renal/GU      Musculoskeletal   Abdominal   Peds  Hematology   Anesthesia Other Findings   Reproductive/Obstetrics                             Anesthesia Physical Anesthesia Plan  ASA: 3  Anesthesia Plan: General   Post-op Pain Management:    Induction: Intravenous  PONV Risk Score and Plan: 2 and Ondansetron, Dexamethasone and Treatment may vary due to age or medical condition  Airway Management Planned: LMA  Additional Equipment:   Intra-op Plan:   Post-operative Plan: Extubation in OR  Informed Consent: I have reviewed the patients History and Physical, chart, labs and discussed the procedure including the risks, benefits and alternatives for the proposed anesthesia with the patient or authorized representative who has indicated his/her understanding and acceptance.     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  Nuclear stress test 03/16/2023 was negative for ischemia.  Echo 12/25/2022 showed EF 50%, no significant valvular abnormalities.  Cardiac stress PET 07/23/2022  showed normal perfusion. Last seen by Francis Dowse, PA-C on 08/07/2023, and noted to be doing well at that time, exercising regularly at the gym.  Per cardiology, patient can hold Eliquis 2 days prior to procedure.    Patient reports last dose Eliquis 08/21/2023.  Preop labs reviewed, WNL.  EKG 08/07/2023: Atrial paced rhythm.  Rate 65.  Perioperative prescription for implanted cardiac device programming per progress note 08/18/2023: Device Information:  Clinic EP Physician:  Dr. Steffanie Dunn   Device Type:  Pacemaker Manufacturer and Phone #:  Medtronic: [id number] Pacemaker Dependent?:  No. Date of Last Device Check:  08/07/2023     Normal Device Function?:  Yes.    Electrophysiologist's Recommendations:   Have magnet available.  Provide continuous ECG monitoring when magnet is used or reprogramming is to be performed.   Procedure may interfere with device function.  Magnet should be placed over device during procedure.  Nuclear stress test 03/16/2023 (Care Everywhere): FINAL COMMENTS  - Myocardial perfusion imaging is normal. No evidence of regadenoson-inducible ischemia.  - Borderline reduced left ventricular ejection fraction. Apical/ septal dyskinesis, likely related to left bundle branch block.   TTE 12/25/2022 (Care Everywhere): INTERPRETATION ---------------------------------------------------------------  MILD LV DYSFUNCTION (See above) WITH MILD LVH  NORMAL RIGHT VENTRICULAR SYSTOLIC FUNCTION  VALVULAR REGURGITATION: MILD AR, MILD MR, TRIVIAL PR, TRIVIAL TR  NO VALVULAR STENOSIS  FREQUENT ECTOPY, SOME IMAGES TECHNCIALLY DIFFICULT, PT HAS CARDIAC MONITOR  DEVICE UNDER SKIN, DID NOT INTEFERE W/ IMAGING. CONTRACTILITY REDUCED  WITH VENTRICULAR ECTOPIC BEATS  NO  PRIOR STUDY FOR COMPARISON   Cardiac PET stress 07/23/2022:  Findings are consistent with no ischemia and no infarction. Normal perfusion. The study is intermediate risk due to  borderline reduced myocardial blood flow reserve, mildly reduced calculated ejection fraction, and no significant EF augmentation with stress. Consider correlation with echocardiography for EF.   No ST deviation was noted. Arrhythmias during stress: occasional PACs. Arrhythmias during recovery: frequent PACs.   LV perfusion is normal. There is no evidence of ischemia. There is no evidence of infarction.   Rest left ventricular function is abnormal. Rest EF: 39 %. Stress left ventricular function is abnormal. Stress EF: 40 %. End diastolic cavity size is normal. End systolic cavity size is normal. Ejection fraction at rest and stress may be impacted by suboptimal gating with frequent ectopy, and may not be accurate. Correlate with echo.   Myocardial blood flow was computed to be 0.35ml/g/min at rest and 1.24ml/g/min at stress. Global myocardial blood flow reserve was 1.97 and was mildly abnormal.   Coronary calcium was present on the attenuation correction CT images. Moderate coronary calcifications were present. Coronary calcifications were present in the left anterior descending artery, left circumflex artery and right coronary artery distribution(s).  )        Anesthesia Quick Evaluation

## 2023-08-24 ENCOUNTER — Ambulatory Visit (HOSPITAL_COMMUNITY): Payer: Self-pay | Admitting: Physician Assistant

## 2023-08-24 ENCOUNTER — Encounter (HOSPITAL_COMMUNITY): Payer: Self-pay | Admitting: Urology

## 2023-08-24 ENCOUNTER — Ambulatory Visit (HOSPITAL_BASED_OUTPATIENT_CLINIC_OR_DEPARTMENT_OTHER): Admitting: Anesthesiology

## 2023-08-24 ENCOUNTER — Ambulatory Visit (HOSPITAL_COMMUNITY)

## 2023-08-24 ENCOUNTER — Encounter (HOSPITAL_COMMUNITY)
Admission: RE | Disposition: A | Discharge status: Home / Self Care | Payer: Self-pay | Source: Home / Self Care | Attending: Urology

## 2023-08-24 ENCOUNTER — Ambulatory Visit (HOSPITAL_COMMUNITY)
Admission: RE | Admit: 2023-08-24 | Discharge: 2023-08-24 | Disposition: A | Discharge status: Home / Self Care | Attending: Urology | Admitting: Urology

## 2023-08-24 DIAGNOSIS — I4891 Unspecified atrial fibrillation: Secondary | ICD-10-CM

## 2023-08-24 DIAGNOSIS — I2510 Atherosclerotic heart disease of native coronary artery without angina pectoris: Secondary | ICD-10-CM | POA: Insufficient documentation

## 2023-08-24 DIAGNOSIS — F418 Other specified anxiety disorders: Secondary | ICD-10-CM

## 2023-08-24 DIAGNOSIS — N202 Calculus of kidney with calculus of ureter: Secondary | ICD-10-CM

## 2023-08-24 DIAGNOSIS — N132 Hydronephrosis with renal and ureteral calculous obstruction: Secondary | ICD-10-CM | POA: Diagnosis not present

## 2023-08-24 DIAGNOSIS — N201 Calculus of ureter: Secondary | ICD-10-CM | POA: Diagnosis present

## 2023-08-24 DIAGNOSIS — Z950 Presence of cardiac pacemaker: Secondary | ICD-10-CM | POA: Insufficient documentation

## 2023-08-24 DIAGNOSIS — R310 Gross hematuria: Secondary | ICD-10-CM | POA: Diagnosis not present

## 2023-08-24 HISTORY — PX: CYSTOSCOPY/URETEROSCOPY/HOLMIUM LASER/STENT PLACEMENT: SHX6546

## 2023-08-24 SURGERY — CYSTOSCOPY/URETEROSCOPY/HOLMIUM LASER/STENT PLACEMENT
Anesthesia: General | Site: Pelvis | Laterality: Left

## 2023-08-24 MED ORDER — FENTANYL CITRATE (PF) 100 MCG/2ML IJ SOLN
INTRAMUSCULAR | Status: DC | PRN
  Administered 2023-08-24: 25 ug via INTRAVENOUS

## 2023-08-24 MED ORDER — OXYCODONE HCL 5 MG PO TABS
5.0000 mg | ORAL_TABLET | Freq: Once | ORAL | Status: AC | PRN
  Administered 2023-08-24: 5 mg via ORAL

## 2023-08-24 MED ORDER — FENTANYL CITRATE PF 50 MCG/ML IJ SOSY: 25.0000 ug | PREFILLED_SYRINGE | INTRAMUSCULAR | Status: DC | PRN

## 2023-08-24 MED ORDER — PHENYLEPHRINE 80 MCG/ML (10ML) SYRINGE FOR IV PUSH (FOR BLOOD PRESSURE SUPPORT)
PREFILLED_SYRINGE | INTRAVENOUS | Status: AC
Start: 2023-08-24 — End: ?
  Filled 2023-08-24: qty 10

## 2023-08-24 MED ORDER — PROPOFOL 10 MG/ML IV BOLUS
INTRAVENOUS | Status: DC | PRN
  Administered 2023-08-24: 100 mg via INTRAVENOUS

## 2023-08-24 MED ORDER — LIDOCAINE HCL (PF) 2 % IJ SOLN
INTRAMUSCULAR | Status: DC | PRN
  Administered 2023-08-24: 50 mg via INTRADERMAL

## 2023-08-24 MED ORDER — IOHEXOL 300 MG/ML  SOLN
INTRAMUSCULAR | Status: DC | PRN
  Administered 2023-08-24: 17 mL

## 2023-08-24 MED ORDER — OXYCODONE HCL 5 MG/5ML PO SOLN: 5.0000 mg | Freq: Once | ORAL | Status: AC | PRN

## 2023-08-24 MED ORDER — LACTATED RINGERS IV SOLN: INTRAVENOUS | Status: DC

## 2023-08-24 MED ORDER — CHLORHEXIDINE GLUCONATE 0.12 % MT SOLN
15.0000 mL | Freq: Once | OROMUCOSAL | Status: AC
  Administered 2023-08-24: 15 mL via OROMUCOSAL

## 2023-08-24 MED ORDER — ONDANSETRON HCL 4 MG/2ML IJ SOLN
INTRAMUSCULAR | Status: AC
  Filled 2023-08-24: qty 2

## 2023-08-24 MED ORDER — LIDOCAINE HCL (PF) 2 % IJ SOLN
INTRAMUSCULAR | Status: AC
  Filled 2023-08-24: qty 5

## 2023-08-24 MED ORDER — ORAL CARE MOUTH RINSE: 15.0000 mL | Freq: Once | OROMUCOSAL | Status: AC

## 2023-08-24 MED ORDER — SODIUM CHLORIDE 0.9 % IR SOLN
Status: DC | PRN
  Administered 2023-08-24: 3000 mL via INTRAVESICAL

## 2023-08-24 MED ORDER — ONDANSETRON HCL 4 MG/2ML IJ SOLN
INTRAMUSCULAR | Status: DC | PRN
  Administered 2023-08-24: 4 mg via INTRAVENOUS

## 2023-08-24 MED ORDER — FENTANYL CITRATE (PF) 100 MCG/2ML IJ SOLN
INTRAMUSCULAR | Status: AC
  Filled 2023-08-24: qty 2

## 2023-08-24 MED ORDER — 0.9 % SODIUM CHLORIDE (POUR BTL) OPTIME
TOPICAL | Status: DC | PRN
  Administered 2023-08-24: 1000 mL

## 2023-08-24 MED ORDER — PROPOFOL 10 MG/ML IV BOLUS
INTRAVENOUS | Status: AC
  Filled 2023-08-24: qty 20

## 2023-08-24 MED ORDER — OXYCODONE HCL 5 MG PO TABS
ORAL_TABLET | ORAL | Status: AC
  Filled 2023-08-24: qty 1

## 2023-08-24 MED ORDER — CEFAZOLIN SODIUM-DEXTROSE 2-4 GM/100ML-% IV SOLN
2.0000 g | INTRAVENOUS | Status: AC
  Administered 2023-08-24: 2 g via INTRAVENOUS
  Filled 2023-08-24: qty 100

## 2023-08-24 MED ORDER — ONDANSETRON HCL 4 MG/2ML IJ SOLN: 4.0000 mg | Freq: Four times a day (QID) | INTRAMUSCULAR | Status: DC | PRN

## 2023-08-24 MED ORDER — PHENYLEPHRINE 80 MCG/ML (10ML) SYRINGE FOR IV PUSH (FOR BLOOD PRESSURE SUPPORT)
PREFILLED_SYRINGE | INTRAVENOUS | Status: DC | PRN
  Administered 2023-08-24: 80 ug via INTRAVENOUS
  Administered 2023-08-24 (×3): 160 ug via INTRAVENOUS

## 2023-08-24 MED ORDER — OXYCODONE HCL 5 MG PO TABS: 5.0000 mg | ORAL_TABLET | Freq: Three times a day (TID) | ORAL | 0 refills | Status: DC | PRN

## 2023-08-24 SURGICAL SUPPLY — 22 items
BAG URO CATCHER STRL LF (MISCELLANEOUS) ×1 IMPLANT
BASKET LASER NITINOL 1.9FR (BASKET) IMPLANT
BASKET ZERO TIP NITINOL 2.4FR (BASKET) IMPLANT
CATH URETERAL DUAL LUMEN 10F (MISCELLANEOUS) IMPLANT
CATH URETL OPEN END 6FR 70 (CATHETERS) ×1 IMPLANT
CLOTH BEACON ORANGE TIMEOUT ST (SAFETY) ×1 IMPLANT
EXTRACTOR STONE 1.7FRX115CM (UROLOGICAL SUPPLIES) IMPLANT
GLOVE BIO SURGEON STRL SZ7.5 (GLOVE) ×1 IMPLANT
GOWN STRL REUS W/ TWL XL LVL3 (GOWN DISPOSABLE) ×1 IMPLANT
GUIDEWIRE ANG ZIPWIRE 038X150 (WIRE) IMPLANT
GUIDEWIRE STR DUAL SENSOR (WIRE) ×1 IMPLANT
KIT TURNOVER KIT A (KITS) IMPLANT
LASER FIB FLEXIVA PULSE ID 365 (Laser) IMPLANT
MANIFOLD NEPTUNE II (INSTRUMENTS) ×1 IMPLANT
PACK CYSTO (CUSTOM PROCEDURE TRAY) ×1 IMPLANT
SHEATH NAVIGATOR HD 11/13X28 (SHEATH) IMPLANT
SHEATH NAVIGATOR HD 11/13X36 (SHEATH) IMPLANT
STENT URET 6FRX26 CONTOUR (STENTS) IMPLANT
TRACTIP FLEXIVA PULS ID 200XHI (Laser) IMPLANT
TRACTIP FLEXIVA PULSE ID 200 (Laser) IMPLANT
TUBING CONNECTING 10 (TUBING) ×1 IMPLANT
TUBING UROLOGY SET (TUBING) ×1 IMPLANT

## 2023-08-24 NOTE — H&P (Signed)
 CC/HPI: Reestablish care   Patient has a history of prostatitis, treated several times per year. Usually responds to antibiotics. Patient has a history of kidney stones, last passed a stone about 10 years ago. He has never had any surgeries for stones. Patient has history of microscopic hematuria which has been evaluated many times. Most recently he had a negative workup in [facility] in December 2023 with cystoscopy and a CT scan demonstrating bilateral kidney stones measuring between 2 to 5 mm. Otherwise the workup was unremarkable.   8/24: PSA 1.17   Interval: The patient is here for the above. He was found to have microscopic hematuria at his primary care visit in August. At that time he is also complaining of fatigue and there was concern about this being related to his heart. He is very active. He gets up 3 or 4 times at night. He has some mild urgency and a weak stream, but otherwise no complaints. He has not had gross hematuria. Denies any burning.   07/10/2023: Patient presents today for evaluation of gross hematuria. At last exam in December he was placed on finasteride by his urologist. He does have a known history of microscopic hematuria as well as a known kidney stone history. Prior CT imaging from 2023 did show small bilateral nonobstructing renal calculi. There is also circumferential bladder wall thickening but otherwise no other concerning GU mass/lesion. Patient does continue Eliquis. He underwent successful pacemaker implantation in December of last year.   Gross hematuria first noted at the beginning of his stream late yesterday evening. He went to the gym earlier in the day and jog about 3.5 miles on the treadmill and also did some weightlifting. He tries to get in the gym for exercise on an almost daily occurrence. At the onset of hematuria he started drinking more water and noted progressive clearing. He is not seeing any additional hematuria today but it is still present on UA  microscopically. Symptoms are not associated with changes in force of stream, increased frequency/urgency from baseline, dysuria. He denies any lower back or flank pain/discomfort suggestive of obstructive uropathy. No correlating fever/chills or nausea/vomiting.   CC: Left ureteral calculus, bilateral renal calculi  HPI:  08/11/2023  89 year old male presents for evaluation of nephrolithiasis. Patient had an episode of gross hematuria and for that reason underwent CT hematuria protocol on 07/29/2023. This revealed mild left hydroureteronephrosis to the level of a 5 mm obstructing calculus in the proximal left ureter. He was also noted to have additional nonobstructing stones within the left renal pelvis largest of which was 0.8 cm. He also had some punctate stones in the right renal pelvis without any hydronephrosis noted measuring up to 0.3 cm. He denies any flank pain. He is having some gross hematuria. Denies any dysuria, fever, chill, nausea, vomiting. Otherwise asymptomatic.     ALLERGIES: No Allergies    MEDICATIONS: Finasteride 5 mg tablet 1 tablet PO Daily  Metoprolol Tartrate  Crestor 5 mg tablet Oral  Diltiazem Hcl  Eliquis 5 mg tablet  Magnesium Oxide 500 mg magnesium tablet Oral  Mirtazapine  Promethazine Hcl  Vitamin D3 50 mcg (2,000 unit) tablet Oral     GU PSH: No GU PSH      PSH Notes: Colon Surgery (1994), Hernia Repair ([id number]), Tonsillectomy   NON-GU PSH: Hernia Repair - 2008 Remove Tonsils - 2008 Visit Complexity (formerly GPC1X) - 07/10/2023     GU PMH: Gross hematuria (Stable) - 07/10/2023, Gross hematuria, - 2016 Microscopic  hematuria - 04/27/2023, - 2021 Nocturia - 04/27/2023, Nocturia, - 2016 Renal calculus - 04/27/2023, Nephrolithiasis, - 2014 Acute prostatitis - 2021 Bladder Stone, Bladder calculus - 2016 BPH w/LUTS, Benign prostatic hyperplasia with urinary obstruction - 2016 Inflammatory Disease Prostate, Unspec, Prostatitis - 2016      PMH Notes:   2006-12-09 12:59:14 - Note: Colon Cancer   NON-GU PMH: Encounter for general adult medical examination without abnormal findings, Encounter for preventive health examination - 2015 Personal history of other diseases of the musculoskeletal system and connective tissue, History of osteopenia - 2014 Personal history of other endocrine, nutritional and metabolic disease, History of hypercholesterolemia - 2014 Anxiety Atrial Fibrillation Colon Cancer, History Depression    FAMILY HISTORY: Family Health Status Number - No Family History   SOCIAL HISTORY: Marital Status: Married Preferred Language: English; Race: White Current Smoking Status: Patient has never smoked.   Tobacco Use Assessment Completed: Used Tobacco in last 30 days? Drinks 2 drinks per week.  Drinks 2 caffeinated drinks per day. Patient's occupation Public relations account executive.     Notes: Never A Smoker, Caffeine Use, Tobacco Use, Death In The Family Mother, Death In The Family Father, Alcohol Use, Marital History - Currently Married, Occupation:   REVIEW OF SYSTEMS:    GU Review Male:   Patient denies frequent urination, hard to postpone urination, burning/ pain with urination, get up at night to urinate, leakage of urine, stream starts and stops, trouble starting your stream, have to strain to urinate , erection problems, and penile pain.  Gastrointestinal (Upper):   Patient denies nausea, vomiting, and indigestion/ heartburn.  Gastrointestinal (Lower):   Patient denies diarrhea and constipation.  Constitutional:   Patient denies fever, night sweats, weight loss, and fatigue.  Skin:   Patient denies skin rash/ lesion and itching.  Eyes:   Patient denies blurred vision and double vision.  Ears/ Nose/ Throat:   Patient denies sore throat and sinus problems.  Hematologic/Lymphatic:   Patient denies swollen glands and easy bruising.  Cardiovascular:   Patient denies leg swelling and chest pains.  Respiratory:   Patient denies cough and  shortness of breath.  Endocrine:   Patient denies excessive thirst.  Musculoskeletal:   Patient denies joint pain and back pain.  Neurological:   Patient denies headaches and dizziness.  Psychologic:   Patient denies depression and anxiety.   VITAL SIGNS:      08/11/2023 10:22 AM  BP 110/62 mmHg  Heart Rate 81 /min  Temperature 98.0 F / 36.6 C   MULTI-SYSTEM PHYSICAL EXAMINATION:    Constitutional: Well-nourished. No physical deformities. Normally developed. Good grooming.  Gastrointestinal: No mass, no tenderness, no rigidity, non obese abdomen.  Eyes: Normal conjunctivae. Normal eyelids.  Musculoskeletal: Normal gait and station of head and neck.     Complexity of Data:  Source Of History:  Patient  Records Review:   Previous Doctor Records, Previous Patient Records  Urine Test Review:   Urinalysis  X-Ray Review: KUB: Reviewed Films. Discussed With Patient.  C.T. Abdomen/Pelvis: Reviewed Report. Discussed With Patient.     07/18/13 12/10/06 12/03/04  PSA  Total PSA 2.21  1.47  1.11     PROCEDURES:         KUB - 74018  A single view of the abdomen is obtained.  Left renal pelvic calculi readily visible.      Patient confirmed No Neulasta OnPro Device.           Urinalysis w/Scope Dipstick Dipstick Cont'd Micro  Color:  Red Bilirubin: Neg mg/dL WBC/hpf: 0 - 5/hpf  Appearance: Cloudy Ketones: Neg mg/dL RBC/hpf: 40 - 78/GNF  Specific Gravity: 1.010 Blood: 3+ ery/uL Bacteria: Few (10-25/hpf)  pH: 8.5 Protein: 1+ mg/dL Cystals: NS (Not Seen)  Glucose: Neg mg/dL Urobilinogen: 0.2 mg/dL Casts: NS (Not Seen)    Nitrites: Neg Trichomonas: Not Present    Leukocyte Esterase: Trace leu/uL Mucous: Not Present      Epithelial Cells: NS (Not Seen)      Yeast: NS (Not Seen)      Sperm: Not Present    Notes: **Unspun micro**    ASSESSMENT:      ICD-10 Details  1 GU:   Renal and ureteral calculus - N20.2 Chronic, Stable  2   Gross hematuria - R31.0 Chronic, Stable    PLAN:           Orders Labs Urine Culture  X-Rays: KUB          Schedule         Document Letter(s):  Created for Patient: Clinical Summary         Notes:   We discussed the management of urinary stones. These options include observation, ureteroscopy, and shockwave lithotripsy. We discussed which options are relevant to these particular stones. We discussed the natural history of stones as well as the complications of untreated stones and the impact on quality of life without treatment as well as with each of the above listed treatments. We also discussed the efficacy of each treatment in its ability to clear the stone burden. With any of these management options I discussed the signs and symptoms of infection and the need for emergent treatment should these be experienced. For each option we discussed the ability of each procedure to clear the patient of their stone burden.   For observation I described the risks which include but are not limited to silent renal damage, life-threatening infection, need for emergent surgery, failure to pass stone, and pain.   For ureteroscopy I described the risks which include heart attack, stroke, pulmonary embolus, death, bleeding, infection, damage to contiguous structures, positioning injury, ureteral stricture, ureteral avulsion, ureteral injury, need for ureteral stent, inability to perform ureteroscopy, need for an interval procedure, inability to clear stone burden, stent discomfort and pain.   For shockwave lithotripsy I described the risks which include arrhythmia, kidney contusion, kidney hemorrhage, need for transfusion, pain, inability to break up stone, inability to pass stone fragments, Steinstrasse, infection associated with obstructing stones, need for different surgical procedure, need for repeat shockwave lithotripsy.   Will plan to proceed with left, possible bilateral ureteroscopy with laser lithotripsy and ureteral stent placements.  Risk benefits discussed. I will work on getting his CT scan images for review.   CC: Dr. Marlou Porch        Next Appointment:      Next Appointment: 08/25/2023 02:45 PM    Appointment Type: Renal Ultrasound    Location: [facility] [phone]    Provider: Radiology Rm1 Radiology Rm 1    Reason for Visit: nxt ava RUS/ CYSTO      Signed by Modena Slater, III, M.D. on 08/11/23 at 11:24 AM (EDT

## 2023-08-24 NOTE — Op Note (Signed)
 Operative Note  Preoperative diagnosis:  1.  Left renal and ureteral calculi, right renal calculi  Postoperative diagnosis: 1.  Same  Procedure(s): 1.  Cystoscopy with left retrograde pyelogram, left ureteroscopy with laser lithotripsy and ureteral stent placement 2.  Right retrograde pyelogram  Surgeon: Modena Slater, MD  Assistants: None  Anesthesia: General  Complications: None immediate  EBL: Minimal  Specimens: 1.  None  Drains/Catheters: 1.  6 x 26 double-J ureteral stent on the left  Intraoperative findings: 1.  Normal anterior urethra 2.  Borderline obstructing prostate 3.  Bladder mucosa without any tumors or masses 4.  Left ureteroscopy confirmed a proximal left ureteral calculus.  This was fragmented retropulsed into the kidney.  He had another stone within the left kidney and all stones within the kidney were laser fragmented to tiny fragments.    5.  Left retrograde pyelogram after treatment of the stones revealed no filling defect and mild hydronephrosis  6.  Right retrograde pyelogram revealed no filling defect and no hydronephrosis and therefore no ureteroscopy was performed on the right  Indication: 89 year old male recently underwent CT hematuria protocol that revealed a 5 mm obstructing left ureteral calculus and 0.8 cm left renal pelvic calculus.  He presents for the previously mentioned operation.  He also had some tiny calculi on the right  Description of procedure:  The patient was identified and consent was obtained.  The patient was taken to the operating room and placed in the supine position.  The patient was placed under general anesthesia.  Perioperative antibiotics were administered.  The patient was placed in dorsal lithotomy.  Patient was prepped and draped in a standard sterile fashion and a timeout was performed.  A 21 French rigid cystoscope was advanced into the urethra and into the bladder.  Complete cystoscopy was performed with findings  noted above.  The left ureter was cannulated with a sensor wire which was advanced up to the kidney under fluoroscopic guidance.  Semirigid ureteroscopy was performed alongside the wire up to the proximal stone which was laser fragmented to smaller fragments.  The retropulsed into the kidney.  No other stones were within the ureter.  I advanced up to the renal pelvis.  Second wire was advanced through the scope and into the kidney under fluoroscopic guidance.  I withdrew the scope.  I advanced a 11 x 13 ureteral access sheath over the wire under continuous fluoroscopic guidance to the proximal ureter.  Inner sheath and wire were withdrawn.  Digital ureteroscopy was performed and several stones within the kidney were laser fragmented all dust settings to tiny fragments.  All stones within the kidney were treated.  I shot a retrograde pyelogram through the scope with the findings noted above.  I withdrew the scope along with the access sheath visualizing the ureter upon removal.  There were no ureteral calculi and no ureteral injury was identified.  I backloaded the wire onto rigid cystoscope and advanced that into the bladder followed by routine placement of a 6 x 26 double-J ureteral stent.  Fluoroscopy confirmed proximal placement and direct visualization confirmed a good coil within the bladder.  I intubated the right ureteral orifice with an open-ended ureteral catheter and a retrograde pyelogram was performed that revealed no hydronephrosis and no filling defects.  I therefore elected not to perform a right-sided ureteroscopy.  I drained the bladder withdrew the scope.  This concluded the operation.  Patient tolerated the procedure well was stable postoperatively.  Plan: Follow-up in 1  week for left-sided stent removal

## 2023-08-24 NOTE — Transfer of Care (Signed)
 Immediate Anesthesia Transfer of Care Note  Patient: [facility]  Procedure(s) Performed: CYSTOSCOPY/URETEROSCOPY/HOLMIUM LASER/STENT PLACEMENT/BILATERAL RETROGRADE PYELOGRAM (Left: Pelvis)  Patient Location: PACU  Anesthesia Type:General  Level of Consciousness: sedated  Airway & Oxygen Therapy: Patient Spontanous Breathing and Patient connected to face mask oxygen  Post-op Assessment: Report given to RN and Post -op Vital signs reviewed and stable  Post vital signs: Reviewed and stable  Last Vitals:  Vitals Value Taken Time  BP    Temp    Pulse    Resp    SpO2      Last Pain:  Vitals:   08/24/23 0633  TempSrc:   PainSc: 0-No pain         Complications: No notable events documented.

## 2023-08-24 NOTE — Anesthesia Postprocedure Evaluation (Signed)
 Anesthesia Post Note  Patient: Aharon Carriere [place]  Procedure(s) Performed: CYSTOSCOPY/URETEROSCOPY/HOLMIUM LASER/STENT PLACEMENT/BILATERAL RETROGRADE PYELOGRAM (Left: Pelvis)     Patient location during evaluation: PACU Anesthesia Type: General Level of consciousness: awake and alert Pain management: pain level controlled Vital Signs Assessment: post-procedure vital signs reviewed and stable Respiratory status: spontaneous breathing, nonlabored ventilation, respiratory function stable and patient connected to nasal cannula oxygen Cardiovascular status: blood pressure returned to baseline and stable Postop Assessment: no apparent nausea or vomiting Anesthetic complications: no   No notable events documented.  Last Vitals:  Vitals:   08/24/23 1000 08/24/23 1005  BP: (!) 145/81 (!) 137/91  Pulse: 66 69  Resp: 14 14  Temp:    SpO2: 95% 97%    Last Pain:  Vitals:   08/24/23 1005  TempSrc:   PainSc: 0-No pain                 Dabid Godown S

## 2023-08-24 NOTE — Anesthesia Procedure Notes (Signed)
 Procedure Name: LMA Insertion Date/Time: 08/24/2023 8:28 AM  Performed by: Doran Clay, CRNAPre-anesthesia Checklist: Patient identified, Emergency Drugs available, Suction available, Patient being monitored and Timeout performed Patient Re-evaluated:Patient Re-evaluated prior to induction Oxygen Delivery Method: Circle system utilized Preoxygenation: Pre-oxygenation with 100% oxygen Induction Type: IV induction LMA: LMA inserted LMA Size: 4.0 Tube type: Oral Number of attempts: 1 Airway Equipment and Method: Stylet Placement Confirmation: ETT inserted through vocal cords under direct vision, positive ETCO2 and breath sounds checked- equal and bilateral Tube secured with: Tape Dental Injury: Teeth and Oropharynx as per pre-operative assessment

## 2023-08-24 NOTE — Discharge Instructions (Signed)

## 2023-08-25 ENCOUNTER — Encounter (HOSPITAL_COMMUNITY): Payer: Self-pay | Admitting: Urology

## 2023-09-07 ENCOUNTER — Ambulatory Visit: Payer: Medicare Other

## 2023-09-15 ENCOUNTER — Other Ambulatory Visit: Payer: Self-pay | Admitting: Internal Medicine

## 2023-09-15 ENCOUNTER — Ambulatory Visit (HOSPITAL_COMMUNITY)
Admission: RE | Admit: 2023-09-15 | Discharge: 2023-09-15 | Disposition: A | Discharge status: Home / Self Care | Source: Ambulatory Visit | Attending: Internal Medicine | Admitting: Internal Medicine

## 2023-09-15 DIAGNOSIS — K8689 Other specified diseases of pancreas: Secondary | ICD-10-CM | POA: Diagnosis present

## 2023-09-15 MED ORDER — GADOBUTROL 1 MMOL/ML IV SOLN
6.0000 mL | Freq: Once | INTRAVENOUS | Status: AC | PRN
  Administered 2023-09-15: 6 mL via INTRAVENOUS

## 2023-09-16 NOTE — CV Procedure (Signed)
  Device system confirmed to be MRI conditional, with implant date > 6 weeks ago, and no evidence of abandoned or epicardial leads in review of most recent CXR  Device last cleared by EP Provider: Creighton Doffing   Clearance is good through for 1 year as long as parameters remain stable at time of check. If pt undergoes a cardiac device procedure during that time, they should be re-cleared.   Tachy-therapies to be programmed off if applicable with device back to pre-MRI settings after completion of exam.  [employer] - Programming recommendation received through [employer] App/Tablet  Arlys Lamer, RT  09/16/2023 10:58 AM

## 2023-09-18 NOTE — Progress Notes (Signed)
 Remote pacemaker transmission.

## 2023-10-12 ENCOUNTER — Ambulatory Visit: Payer: Medicare Other

## 2023-10-27 ENCOUNTER — Encounter: Payer: Self-pay | Admitting: Sports Medicine

## 2023-10-27 ENCOUNTER — Ambulatory Visit (INDEPENDENT_AMBULATORY_CARE_PROVIDER_SITE_OTHER): Admitting: Sports Medicine

## 2023-10-27 VITALS — BP 122/70 | Ht 70.0 in | Wt 152.0 lb

## 2023-10-27 DIAGNOSIS — M67959 Unspecified disorder of synovium and tendon, unspecified thigh: Secondary | ICD-10-CM

## 2023-10-27 NOTE — Progress Notes (Addendum)
   Subjective:    Patient ID: Derrick Compton, male    DOB: 24-Mar-1935, 89 y.o.   MRN: [id number]  HPI chief complaint: Right hip pain  Patient is a very pleasant 89 year old active gentleman that presents today with acute on chronic right hip pain.  Pain is all along the lateral hip.  It is worse with prolonged walking which she enjoys doing for exercise.  He denies any groin pain.  He saw Dr. Lucienne Ryder a few months ago and was reassured that he does not need a total hip arthroplasty.  X-rays at that time showed no significant degenerative changes.  He takes Advil as needed which is somewhat helpful.  He has not tried any topical anti-inflammatory medications.  No prior hip surgery.  Past medical history reviewed Medications reviewed Allergies reviewed  Review of Systems As above    Objective:   Physical Exam  Well-developed, well-nourished.  No acute distress  Right hip: Smooth painless hip range of motion with a negative logroll.  There is tenderness to palpation at the posterior aspect of the greater trochanter with a positive Trendelenburg bilaterally.  Hip abductor weakness.  Neurovascularly intact distally.      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  He is very active and I think he will respond well to PT and quickly advanced to a home exercise program.  He will follow-up with me again in 6 weeks.  I also recommended a trial of topical Voltaren  gel but he will need to check with his cardiologist first to make sure that this is safe for him.  This note was dictated using Dragon naturally speaking software and may contain errors in syntax, spelling, or content which have not been identified prior to signing this note.

## 2023-11-05 ENCOUNTER — Ambulatory Visit (INDEPENDENT_AMBULATORY_CARE_PROVIDER_SITE_OTHER): Payer: Medicare Other

## 2023-11-05 DIAGNOSIS — R001 Bradycardia, unspecified: Secondary | ICD-10-CM | POA: Diagnosis not present

## 2023-11-05 LAB — CUP PACEART REMOTE DEVICE CHECK
Battery Remaining Longevity: 160 mo
Battery Voltage: 3.18 V
Brady Statistic AP VP Percent: 0.03 %
Brady Statistic AP VS Percent: 81.23 %
Brady Statistic AS VP Percent: 0.01 %
Brady Statistic AS VS Percent: 18.74 %
Brady Statistic RA Percent Paced: 80.45 %
Brady Statistic RV Percent Paced: 0.03 %
Date Time Interrogation Session: 20250612061328
Implantable Lead Connection Status: 753985
Implantable Lead Connection Status: 753985
Implantable Lead Implant Date: 20241211
Implantable Lead Implant Date: 20241211
Implantable Lead Location: 753859
Implantable Lead Location: 753860
Implantable Lead Model: 3830
Implantable Lead Model: 5076
Implantable Pulse Generator Implant Date: 20241211
Lead Channel Impedance Value: 304 Ohm
Lead Channel Impedance Value: 323 Ohm
Lead Channel Impedance Value: 418 Ohm
Lead Channel Impedance Value: 475 Ohm
Lead Channel Pacing Threshold Amplitude: 0.5 V
Lead Channel Pacing Threshold Amplitude: 1.125 V
Lead Channel Pacing Threshold Pulse Width: 0.4 ms
Lead Channel Pacing Threshold Pulse Width: 0.4 ms
Lead Channel Sensing Intrinsic Amplitude: 2.25 mV
Lead Channel Sensing Intrinsic Amplitude: 2.25 mV
Lead Channel Sensing Intrinsic Amplitude: 23 mV
Lead Channel Sensing Intrinsic Amplitude: 23 mV
Lead Channel Setting Pacing Amplitude: 1.5 V
Lead Channel Setting Pacing Amplitude: 2.25 V
Lead Channel Setting Pacing Pulse Width: 0.4 ms
Lead Channel Setting Sensing Sensitivity: 1.2 mV
Zone Setting Status: 755011

## 2023-11-07 ENCOUNTER — Ambulatory Visit: Payer: Self-pay | Admitting: Cardiology

## 2023-11-16 ENCOUNTER — Ambulatory Visit: Payer: Medicare Other

## 2023-11-22 ENCOUNTER — Telehealth: Payer: Self-pay | Admitting: Cardiology

## 2023-11-22 NOTE — Telephone Encounter (Signed)
 Outpatient service line:  [facility] called reporting that patient is requesting a refill of Eliquis .  I provided this refill request and prescribed 5 mg twice daily for 11 refills.  I gave verbal order.  I do not see that Eliquis  is on his med list so I will be sending this note to [facility] who had seen him last to ensure he is supposed to be taking this.  I saw that her previous documentation of possible Watchman procedure as patient wanted to come off of anticoagulation but this was pertaining to events during 2021.

## 2023-12-02 ENCOUNTER — Encounter: Payer: Self-pay | Admitting: Sports Medicine

## 2023-12-02 ENCOUNTER — Ambulatory Visit (INDEPENDENT_AMBULATORY_CARE_PROVIDER_SITE_OTHER): Admitting: Sports Medicine

## 2023-12-02 VITALS — BP 108/64 | Ht 70.0 in | Wt 152.0 lb

## 2023-12-02 DIAGNOSIS — M25551 Pain in right hip: Secondary | ICD-10-CM | POA: Diagnosis not present

## 2023-12-02 DIAGNOSIS — M67959 Unspecified disorder of synovium and tendon, unspecified thigh: Secondary | ICD-10-CM | POA: Diagnosis present

## 2023-12-02 NOTE — Patient Instructions (Signed)
 Derrick Compton  I think it is a good idea to get physical therapy another month.  If that is not helping, contact my office and we can discuss either getting an MRI, trying a cortisone injection under ultrasound at our [facility] office, or returning to Dr. Vernetta for his opinion.

## 2023-12-02 NOTE — Progress Notes (Signed)
   Subjective:    Patient ID: Derrick Compton, male    DOB: Jul 14, 1934, 89 y.o.   MRN: [id number]  HPI  Derrick Compton presents today for follow-up on lateral right hip pain.  Pain has improved slightly with physical therapy.  It is worse with walking.  He enjoys walking 3 miles a day and he has some days where it is painful and other days where it is more tolerable.  Voltaren  gel has not been very helpful.  He denies groin pain.  Previous x-rays of his right hip showed no significant arthropathy.   Review of Systems As above    Objective:   Physical Exam  Well-developed, well-nourished.  No acute distress  Right hip: Smooth painless hip range of motion with a negative logroll.  Slight tenderness to palpation at the posterior greater trochanter.  Still with obvious hip weakness with a positive Trendelenburg on the side.  Neurovascularly intact distally.      Assessment & Plan:   Persistent lateral right hip pain likely secondary to gluteus medius/gluteus minimus tendon tearing  We discussed continuing with physical therapy, MRI, injection under ultrasound at our [facility] office, and follow-up with Dr. Vernetta who he has seen previously.  Derrick Compton would like to give physical therapy another 4 weeks.  If he does not notice improvement after this period of time, he is instructed to notify my office and we can reconsider any of the other options.  This note was dictated using Dragon naturally speaking software and may contain errors in syntax, spelling, or content which have not been identified prior to signing this note.

## 2023-12-17 ENCOUNTER — Encounter: Payer: Self-pay | Admitting: Cardiology

## 2023-12-21 ENCOUNTER — Ambulatory Visit: Payer: Medicare Other

## 2023-12-29 ENCOUNTER — Ambulatory Visit: Attending: Cardiovascular Disease | Admitting: Cardiovascular Disease

## 2023-12-29 ENCOUNTER — Encounter: Payer: Self-pay | Admitting: Cardiovascular Disease

## 2023-12-29 VITALS — BP 128/70 | HR 68 | Ht 70.0 in | Wt 155.8 lb

## 2023-12-29 DIAGNOSIS — Z8679 Personal history of other diseases of the circulatory system: Secondary | ICD-10-CM | POA: Diagnosis present

## 2023-12-29 DIAGNOSIS — R072 Precordial pain: Secondary | ICD-10-CM | POA: Diagnosis present

## 2023-12-29 DIAGNOSIS — E782 Mixed hyperlipidemia: Secondary | ICD-10-CM | POA: Diagnosis present

## 2023-12-29 DIAGNOSIS — Z9889 Other specified postprocedural states: Secondary | ICD-10-CM | POA: Insufficient documentation

## 2023-12-29 DIAGNOSIS — I25118 Atherosclerotic heart disease of native coronary artery with other forms of angina pectoris: Secondary | ICD-10-CM | POA: Diagnosis present

## 2023-12-29 MED ORDER — METOPROLOL TARTRATE 100 MG PO TABS: ORAL_TABLET | ORAL | 0 refills | Status: AC

## 2023-12-29 NOTE — Patient Instructions (Signed)
 Medication Instructions:  Your physician recommends that you continue on your current medications as directed. Please refer to the Current Medication list given to you today.  *If you need a refill on your cardiac medications before your next appointment, please call your pharmacy*    Testing/Procedures: Your physician has requested that you have an echocardiogram. Echocardiography is a painless test that uses sound waves to create images of your heart. It provides your doctor with information about the size and shape of your heart and how well your heart's chambers and valves are working. This procedure takes approximately one hour. There are no restrictions for this procedure. Please do NOT wear cologne, perfume, aftershave, or lotions (deodorant is allowed). Please arrive 15 minutes prior to your appointment time.  Please note: We ask at that you not bring children with you during ultrasound (echo/ vascular) testing. Due to room size and safety concerns, children are not allowed in the ultrasound rooms during exams. Our front office staff cannot provide observation of children in our lobby area while testing is being conducted. An adult accompanying a patient to their appointment will only be allowed in the ultrasound room at the discretion of the ultrasound technician under special circumstances. We apologize for any inconvenience.  .  Follow-Up: At [facility], you and your health needs are our priority.  As part of our continuing mission to provide you with exceptional heart care, our providers are all part of one team.  This team includes your primary Cardiologist (physician) and Advanced Practice Providers or APPs (Physician Assistants and Nurse Practitioners) who all work together to provide you with the care you need, when you need it.  Your next appointment:   3 month(s)  Provider:   Dorn Lesches, MD    We recommend signing up for the patient portal called MyChart.   Sign up information is provided on this After Visit Summary.  MyChart is used to connect with patients for Virtual Visits (Telemedicine).  Patients are able to view lab/test results, encounter notes, upcoming appointments, etc.  Non-urgent messages can be sent to your provider as well.   To learn more about what you can do with MyChart, go to [email or web address].   Other Instructions   Your cardiac CT will be scheduled at the below location:    [facility] [address]  [place], [place] [address]  If scheduled at the Heart and Vascular Tower at [address], please enter the parking lot using the [address] entrance and use the FREE valet service at the patient drop-off area. Enter the building and check-in with registration on the main floor.    Please follow these instructions carefully (unless otherwise directed):  An IV will be required for this test and Nitroglycerin  will be given.  Hold all erectile dysfunction medications at least 3 days (72 hrs) prior to test. (Ie viagra, cialis, sildenafil, tadalafil, etc)   On the Night Before the Test: Be sure to Drink plenty of water. Do not consume any caffeinated/decaffeinated beverages or chocolate 12 hours prior to your test. Do not take any antihistamines 12 hours prior to your test.  On the Day of the Test: Drink plenty of water until 1 hour prior to the test. Do not eat any food 1 hour prior to test. You may take your regular medications prior to the test.  Take metoprolol  (Lopressor ) 100mg   two hours prior to test. If you take Furosemide/Hydrochlorothiazide/Spironolactone/Chlorthalidone, please HOLD on the morning of  the test. Patients who wear a continuous glucose monitor MUST remove the device prior to scanning. FEMALES- please wear underwire-free bra if available, avoid dresses & tight clothing       After the Test: Drink plenty of water. After receiving IV contrast, you may  experience a mild flushed feeling. This is normal. On occasion, you may experience a mild rash up to 24 hours after the test. This is not dangerous. If this occurs, you can take Benadryl 25 mg, Zyrtec, Claritin, or Allegra and increase your fluid intake. (Patients taking Tikosyn should avoid Benadryl, and may take Zyrtec, Claritin, or Allegra) If you experience trouble breathing, this can be serious. If it is severe call 911 IMMEDIATELY. If it is mild, please call our office.  We will call to schedule your test 2-4 weeks out understanding that some insurance companies will need an authorization prior to the service being performed.   For more information and frequently asked questions, please visit our website : [email or web address]  For non-scheduling related questions, please contact the cardiac imaging nurse navigator should you have any questions/concerns: Cardiac Imaging Nurse Navigators Direct Office Dial: [phone]   For scheduling needs, including cancellations and rescheduling, please call [place], [phone].

## 2023-12-29 NOTE — Progress Notes (Unsigned)
 12/29/2023 Derrick Compton   02/08/1935  [id number]  Primary Physician Dawna Domino, MD Primary Cardiologist: Dorn JINNY Lesches MD GENI CODY LYNITA ILAH  HPI:  Derrick Compton is a 89 y.o.  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 He was referred by the [facility] clinic for cardiovascular evaluation because of proximity and progressive chest pain. I last saw him in the office 04/12/2021.  His only cardiovascular risk factor is hyperlipidemia. He did have a coronary calcium  score performed 10/04/15 which was 181 a subsequent GXT which was normal. He exercises frequently has noticed progressive substernal chest pain with running. I perform Myoview  stress testing on 11/11/16 which was entirely normal 2-D echo performed 12/09/16 showed mild aortic stenosis. He studies continued symptoms I ordered a coronary CTA/FFR on 12/24/16 showed a significant lesion in a diagonal branch.   He underwent cardiac catheterization by myself 02/05/17 revealing a 90% ostial high first diagonal branch stenosis which came off orthogonal to the LAD making percutaneous intervention high risk. I do not think that the risk of intervention would be worth potential benefit of revascularization. The patient, because of his relative hypotension and bradycardia is not a good candidate for anginal medications.    He has developed A. fib seen on an event monitor 06/23/2019.  He did see Dr. Kelsie in the clinic for evaluation of this and was placed on Eliquis .  Over the last several months he is noticed increasing dyspnea on exertion as well as substernal chest pressure on exertion .  He also gets episodes related to fib several times a week with heart rates up to 150 which he can tell on his apple watch.  Due to these episodes he does get some substernal chest pain.  Recent coronary CTA performed 09/12/2019  revealed a coronary calcium  score of 217 without significant CAD.  He saw Dr. Kelsie in the office 09/19/2019 2 again discussed A. fib ablation which now he wishes to pursue.   He underwent successful A. fib ablation by Dr. Kelsie 11/10/2019.  He remains on Eliquis  oral anticoagulation and continues to maintain sinus rhythm.  He does get some effort angina when exercising since that time.  He did have a coronary CTA performed 09/12/2019 that only showed first diagonal branch ostial disease and otherwise no evidence of CAD, consistent with his heart cath performed 02/05/2017.  Since I saw him in the office close to 3 years ago he did have a cardiac PET study performed because of chest pain 07/23/2022 which was negative.  He had a permanent pacemaker implanted by Dr. Cindie December 24 because of complete heart block.  Over the last several months he has noted increasing shortness of breath and chest pain.   Current Meds  Medication Sig   Cholecalciferol 50 MCG (2000 UT) TABS 2,000 Units in the morning.   escitalopram (LEXAPRO) 20 MG tablet Take 20 mg by mouth daily.   Evolocumab  (REPATHA  SURECLICK) 140 MG/ML SOAJ INJECT 1 PEN INTO THE SKIN EVERY 14 (FOURTEEN) DAYS.   finasteride  (PROSCAR ) 5 MG tablet Take 5 mg by mouth at bedtime.   LORazepam  (ATIVAN ) 0.5 MG tablet Take 1 tablet (0.5 mg total) by mouth 3 (three) times daily as needed for anxiety.   Magnesium 250 MG TABS Take 250 mg by mouth in the morning.   mirtazapine  (REMERON ) 30 MG tablet TAKE 1 TABLET BY MOUTH AT BEDTIME.  rosuvastatin  (CRESTOR ) 40 MG tablet TAKE 1 TABLET BY MOUTH EVERY DAY     Allergies  Allergen Reactions   Niacin Other (See Comments)    panic attack   Prednisone Other (See Comments)    Upset stomach     Social History   Socioeconomic History   Marital status: Married    Spouse name: Not on file   Number of children: Not on file   Years of education: Not on file   Highest education level: Not on file   Occupational History   Not on file  Tobacco Use   Smoking status: Never   Smokeless tobacco: Never  Vaping Use   Vaping status: Never Used  Substance and Sexual Activity   Alcohol use: Yes    Alcohol/week: 2.0 standard drinks of alcohol    Types: 2 Shots of liquor per week    Comment: occas.   Drug use: No   Sexual activity: Not on file  Other Topics Concern   Not on file  Social History Narrative   Lives in [place] with wife.      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   Social Drivers of Corporate investment banker Strain: Low Risk  (02/13/2022)   Received from [facility]   Overall Financial Resource Strain (CARDIA)    Difficulty of Paying Living Expenses: Not hard at all  Food Insecurity: No Food Insecurity (05/07/2023)   Hunger Vital Sign    Worried About Running Out of Food in the Last Year: Never true    Ran Out of Food in the Last Year: Never true  Transportation Needs: No Transportation Needs (05/07/2023)   PRAPARE - Administrator, Civil Service (Medical): No    Lack of Transportation (Non-Medical): No  Physical Activity: Sufficiently Active (09/26/2019)   Received from [facility] visits prior to 07/26/2022.   Exercise Vital Sign    On average, how many days per week do you engage in moderate to strenuous exercise (like a brisk walk)?: 5 days    On average, how many minutes do you engage in exercise at this level?: 60 min  Stress: No Stress Concern Present (11/13/2017)   Received from [facility] - Occupational Stress Questionnaire    Feeling of Stress : Only a little  Social Connections: Unknown (11/13/2017)   Received from [facility]   Social Connection and Isolation Panel    Frequency of Communication with Friends and Family: Not on file    Frequency of Social Gatherings with Friends and Family: Not on  file    Attends Religious Services: Not on file    Active Member of Clubs or Organizations: Not on file    Attends Banker Meetings: Not on file    Marital Status: Married  Intimate Partner Violence: Unknown (05/07/2023)   Humiliation, Afraid, Rape, and Kick questionnaire    Fear of Current or Ex-Partner: No    Emotionally Abused: No    Physically Abused: Not on file    Sexually Abused: Not on file     Review of Systems: General: negative for chills, fever, night sweats or weight changes.  Cardiovascular: negative for chest pain, dyspnea on exertion, edema, orthopnea, palpitations, paroxysmal nocturnal dyspnea or shortness of breath Dermatological: negative for rash Respiratory: negative for cough or wheezing Urologic: negative for hematuria Abdominal: negative for nausea, vomiting, diarrhea,  bright red blood per rectum, melena, or hematemesis Neurologic: negative for visual changes, syncope, or dizziness All other systems reviewed and are otherwise negative except as noted above.    Blood pressure 128/70, pulse 68, height 5' 10 (1.778 m), weight 155 lb 12.8 oz (70.7 kg), SpO2 96%.  General appearance: alert and no distress Neck: no adenopathy, no carotid bruit, no JVD, supple, symmetrical, trachea midline, and thyroid not enlarged, symmetric, no tenderness/mass/nodules Lungs: clear to auscultation bilaterally Heart: regular rate and rhythm, S1, S2 normal, no murmur, click, rub or gallop Extremities: extremities normal, atraumatic, no cyanosis or edema Pulses: 2+ and symmetric Skin: Skin color, texture, turgor normal. No rashes or lesions Neurologic: Grossly normal  EKG not performed today      ASSESSMENT AND PLAN:   Hyperlipidemia History of hyperlipidemia on Repatha  and rosuvastatin  with lipid profile performed 11 months ago revealing total cholesterol 95, direct LDL 42 and HDL 47.  Coronary artery disease History of CAD by CTA performed 09/12/19 revealing a  coronary calcium  score up to 17 without significant obstructive disease.  He did have a cardiac PET study performed 08/01/2022 that showed no reversible ischemia.  Over the last several months he has noticed increasing dyspnea and substernal chest pain along with fatigue especially when playing golf.  I am going to get a 2D echo and a coronary CTA to further evaluate.  S/P ablation of atrial fibrillation History of PAF status post A-fib ablation by Dr. Kelsie 11/10/2019.  He recently had a pacemaker implanted by Dr. Cindie 05/06/2023 because of complete heart block.  He was hospitalized in March for hematuria and at that time his Eliquis  was discontinued but it was subsequently restarted.     Dorn DOROTHA Lesches MD FACP,FACC,FAHA, [facility] 12/29/2023 4:25 PM

## 2023-12-29 NOTE — Assessment & Plan Note (Signed)
 History of PAF status post A-fib ablation by Dr. Kelsie 11/10/2019.  He recently had a pacemaker implanted by Dr. Cindie 05/06/2023 because of complete heart block.  He was hospitalized in March for hematuria and at that time his Eliquis  was discontinued but it was subsequently restarted.

## 2023-12-29 NOTE — Assessment & Plan Note (Addendum)
 History of hyperlipidemia on Repatha  and rosuvastatin  with lipid profile performed 11 months ago revealing total cholesterol 95, direct LDL 42 and HDL 47.

## 2023-12-29 NOTE — Assessment & Plan Note (Signed)
 History of CAD by CTA performed 09/12/19 revealing a coronary calcium  score up to 17 without significant obstructive disease.  He did have a cardiac PET study performed 08/01/2022 that showed no reversible ischemia.  Over the last several months he has noticed increasing dyspnea and substernal chest pain along with fatigue especially when playing golf.  I am going to get a 2D echo and a coronary CTA to further evaluate.

## 2023-12-30 NOTE — Progress Notes (Signed)
 Remote pacemaker transmission.

## 2024-01-04 ENCOUNTER — Other Ambulatory Visit: Payer: Self-pay | Admitting: Psychiatry

## 2024-01-04 DIAGNOSIS — F331 Major depressive disorder, recurrent, moderate: Secondary | ICD-10-CM

## 2024-01-04 DIAGNOSIS — F5105 Insomnia due to other mental disorder: Secondary | ICD-10-CM

## 2024-01-05 ENCOUNTER — Other Ambulatory Visit: Payer: Self-pay | Admitting: Cardiovascular Disease

## 2024-01-05 ENCOUNTER — Ambulatory Visit (HOSPITAL_COMMUNITY)
Admission: RE | Admit: 2024-01-05 | Discharge: 2024-01-05 | Disposition: A | Discharge status: Home / Self Care | Source: Ambulatory Visit | Attending: Cardiovascular Disease | Admitting: Cardiovascular Disease

## 2024-01-05 DIAGNOSIS — R931 Abnormal findings on diagnostic imaging of heart and coronary circulation: Secondary | ICD-10-CM | POA: Diagnosis not present

## 2024-01-05 DIAGNOSIS — R072 Precordial pain: Secondary | ICD-10-CM | POA: Diagnosis present

## 2024-01-05 DIAGNOSIS — I2510 Atherosclerotic heart disease of native coronary artery without angina pectoris: Secondary | ICD-10-CM | POA: Diagnosis not present

## 2024-01-05 DIAGNOSIS — R0789 Other chest pain: Secondary | ICD-10-CM

## 2024-01-05 DIAGNOSIS — R0602 Shortness of breath: Secondary | ICD-10-CM | POA: Insufficient documentation

## 2024-01-05 DIAGNOSIS — R079 Chest pain, unspecified: Secondary | ICD-10-CM | POA: Diagnosis not present

## 2024-01-05 MED ORDER — SODIUM CHLORIDE 0.9 % IV BOLUS
500.0000 mL | Freq: Once | INTRAVENOUS | Status: AC | PRN
  Administered 2024-01-05 (×2): 500 mL via INTRAVENOUS

## 2024-01-05 MED ORDER — NITROGLYCERIN 0.4 MG SL SUBL
0.8000 mg | SUBLINGUAL_TABLET | Freq: Once | SUBLINGUAL | Status: AC
  Administered 2024-01-05 (×2): 0.8 mg via SUBLINGUAL

## 2024-01-05 MED ORDER — IOHEXOL 350 MG/ML SOLN
100.0000 mL | Freq: Once | INTRAVENOUS | Status: AC | PRN
  Administered 2024-01-05 (×2): 100 mL via INTRAVENOUS

## 2024-01-05 NOTE — Progress Notes (Signed)
 Patient presents for coronary CTA.   Post nitroglycerin  administration, BP 54/37 HR 62.  Patient reports feeling lightheaded, no LOC.  IV fluids started per protocol.   BP 99/66 HR 64.  Dr. Barbaraann made aware.  Patient taken to prep area, fluids completed per protocol, PO caffeine provided.   Patient monitored for approximately 30 minutes.   BP 125/79 HR 62, patient denies symptoms and reports feeling back to baseline.  Patient discharged home.

## 2024-01-06 ENCOUNTER — Ambulatory Visit: Payer: Self-pay | Admitting: Cardiovascular Disease

## 2024-01-13 NOTE — Telephone Encounter (Signed)
 The only thing that was abnormal was the diagonal branch ostium by [facility] analysis and this corresponds to the area of abnormality by cath in 2018 which showed a 95% ostial first diagonal branch stenosis. Otherwise, he had no obstructive disease.  J. Berry,MD 01/13/24 1:20 pm

## 2024-01-18 ENCOUNTER — Other Ambulatory Visit: Payer: Self-pay | Admitting: Psychiatry

## 2024-01-18 DIAGNOSIS — F331 Major depressive disorder, recurrent, moderate: Secondary | ICD-10-CM

## 2024-01-18 DIAGNOSIS — F5105 Insomnia due to other mental disorder: Secondary | ICD-10-CM

## 2024-01-26 ENCOUNTER — Ambulatory Visit (INDEPENDENT_AMBULATORY_CARE_PROVIDER_SITE_OTHER)

## 2024-01-26 VITALS — BP 118/70 | Ht 70.0 in | Wt 155.0 lb

## 2024-01-26 DIAGNOSIS — M67951 Unspecified disorder of synovium and tendon, right thigh: Secondary | ICD-10-CM | POA: Diagnosis not present

## 2024-01-26 DIAGNOSIS — M25551 Pain in right hip: Secondary | ICD-10-CM

## 2024-01-26 NOTE — Progress Notes (Signed)
   Subjective:    Patient ID: Derrick Compton, male    DOB: 89 y.o., 29-Mar-1935   MRN: [id number]  HPI  Chief Complaint: R hip pain follow up  Previously seen by Dr. Arvell on 12/02/2023. Diagnosed with gluteus medius/minimus tendinopathy.  Given prescription for physical therapy.  Recommend ultrasound-guided injection.     Objective:   Physical Exam Vitals:   01/26/24 1304  BP: 118/70   R hip: Tenderness to palpation at gluteus medius insertion point. Pain reproduced with resisted gluteus medius muscle testing.  Extracorporeal Shockwave Therapy Procedure Following the description of risks including pain, bruising, local skin irritation, damage to surrounding structures, patient provided verbal/written consent for ESWT procedure. Palpation was used to identify the gluteus medius tendon. Patient and probe was sterilely prepped in the usual fashion with alcohol.  Total strikes: 2000 Intensity: 140 Frequency: 10hz   Patient tolerated well without complication. Precautions provided.     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  I agree with the prior diagnosis of gluteus medius tendinopathy and specifically discussed the treatment options therefor including PRP, shockwave, steroid injection, prolotherapy, physical therapy, topical anti-inflammatories, oral anti-inflammatories.  Ultimately, the patient decided to proceed with continuing PT and initiating shockwave therapy treatment.  First shockwave therapy treatment delivered pro bono here today.  Recommend follow-up in 1 week.

## 2024-01-28 ENCOUNTER — Encounter: Payer: Self-pay | Admitting: Cardiology

## 2024-02-02 ENCOUNTER — Encounter: Payer: Self-pay | Admitting: Sports Medicine

## 2024-02-02 ENCOUNTER — Ambulatory Visit (INDEPENDENT_AMBULATORY_CARE_PROVIDER_SITE_OTHER): Payer: Self-pay | Admitting: Sports Medicine

## 2024-02-02 VITALS — Ht 70.0 in | Wt 155.0 lb

## 2024-02-02 DIAGNOSIS — M25551 Pain in right hip: Secondary | ICD-10-CM

## 2024-02-02 NOTE — Progress Notes (Signed)
 Patient ID: Derrick Compton, male   DOB: [birth year], 89 y.o.   MRN: [id number]  Derrick Compton presents today for his second soundwave treatment for right hip greater trochanteric pain syndrome.  He has noticed some benefit from the first treatment.  He is also enrolled in physical therapy.  Treatment performed as below.  He tolerates this without difficulty.  Follow-up next week for his third treatment and continue with PT.  Procedure: ECSWT   Procedure Details Consent: Risks of procedure as well as the alternatives and risks of each were explained to the patient.  Written consent for procedure obtained. Time Out: Verified patient identification, verified procedure, site was marked, verified correct patient position, medications/allergies/relevent history reviewed.  The area was cleaned with alcohol swab.     The gluteus medius tendon insertion onto the greater trochanter was targeted for Extracorporeal shockwave therapy.    Power Level: 140 Frequency: 10 Impulse/cycles: 2000   Patient tolerated procedure well without immediate complications   This note was dictated using Dragon naturally speaking software and may contain errors in syntax, spelling, or content which have not been identified prior to signing this note.

## 2024-02-04 ENCOUNTER — Ambulatory Visit: Payer: Medicare Other

## 2024-02-04 ENCOUNTER — Ambulatory Visit (HOSPITAL_COMMUNITY)
Admission: RE | Admit: 2024-02-04 | Discharge: 2024-02-04 | Disposition: A | Discharge status: Home / Self Care | Source: Ambulatory Visit | Attending: Cardiology | Admitting: Cardiology

## 2024-02-04 DIAGNOSIS — Z9889 Other specified postprocedural states: Secondary | ICD-10-CM | POA: Diagnosis present

## 2024-02-04 DIAGNOSIS — R072 Precordial pain: Secondary | ICD-10-CM | POA: Insufficient documentation

## 2024-02-04 DIAGNOSIS — E782 Mixed hyperlipidemia: Secondary | ICD-10-CM | POA: Diagnosis not present

## 2024-02-04 DIAGNOSIS — Z8679 Personal history of other diseases of the circulatory system: Secondary | ICD-10-CM | POA: Diagnosis present

## 2024-02-04 DIAGNOSIS — R001 Bradycardia, unspecified: Secondary | ICD-10-CM

## 2024-02-04 DIAGNOSIS — I25118 Atherosclerotic heart disease of native coronary artery with other forms of angina pectoris: Secondary | ICD-10-CM | POA: Diagnosis not present

## 2024-02-04 LAB — CUP PACEART REMOTE DEVICE CHECK
Battery Remaining Longevity: 156 mo
Battery Voltage: 3.14 V
Brady Statistic AP VP Percent: 0.02 %
Brady Statistic AP VS Percent: 82.62 %
Brady Statistic AS VP Percent: 0.01 %
Brady Statistic AS VS Percent: 17.35 %
Brady Statistic RA Percent Paced: 81.77 %
Brady Statistic RV Percent Paced: 0.03 %
Date Time Interrogation Session: 20250910221038
Implantable Lead Connection Status: 753985
Implantable Lead Connection Status: 753985
Implantable Lead Implant Date: 20241211
Implantable Lead Implant Date: 20241211
Implantable Lead Location: 753859
Implantable Lead Location: 753860
Implantable Lead Model: 3830
Implantable Lead Model: 5076
Implantable Pulse Generator Implant Date: 20241211
Lead Channel Impedance Value: 323 Ohm
Lead Channel Impedance Value: 342 Ohm
Lead Channel Impedance Value: 418 Ohm
Lead Channel Impedance Value: 456 Ohm
Lead Channel Pacing Threshold Amplitude: 0.5 V
Lead Channel Pacing Threshold Amplitude: 1.125 V
Lead Channel Pacing Threshold Pulse Width: 0.4 ms
Lead Channel Pacing Threshold Pulse Width: 0.4 ms
Lead Channel Sensing Intrinsic Amplitude: 2.625 mV
Lead Channel Sensing Intrinsic Amplitude: 2.625 mV
Lead Channel Sensing Intrinsic Amplitude: 22.5 mV
Lead Channel Sensing Intrinsic Amplitude: 22.5 mV
Lead Channel Setting Pacing Amplitude: 1.5 V
Lead Channel Setting Pacing Amplitude: 2.25 V
Lead Channel Setting Pacing Pulse Width: 0.4 ms
Lead Channel Setting Sensing Sensitivity: 1.2 mV
Zone Setting Status: 755011

## 2024-02-04 LAB — ECHOCARDIOGRAM COMPLETE: S' Lateral: 2.1 cm

## 2024-02-07 ENCOUNTER — Ambulatory Visit: Payer: Self-pay | Admitting: Cardiology

## 2024-02-08 NOTE — Telephone Encounter (Signed)
 Court Dorn PARAS, MD to [facility] Triage (Selected Message)     02/07/24  9:47 AM Yes , OK to play golf

## 2024-02-09 ENCOUNTER — Ambulatory Visit (INDEPENDENT_AMBULATORY_CARE_PROVIDER_SITE_OTHER): Payer: Self-pay | Admitting: Sports Medicine

## 2024-02-09 VITALS — Ht 70.0 in

## 2024-02-09 DIAGNOSIS — M25551 Pain in right hip: Secondary | ICD-10-CM

## 2024-02-09 NOTE — Progress Notes (Signed)
 Patient ID: Derrick Compton, male   DOB: 17-Oct-1934, 89 y.o.   MRN: [id number]  Derrick Compton presents today for his third shockwave treatment for his right hip greater trochanteric pain syndrome.  He continues to notice improvement.  Treatment performed as below.  He will follow-up next week for his fourth treatment.  Procedure: ECSWT Indications: Right hip greater trochanteric pain syndrome   Procedure Details Consent: Risks of procedure as well as the alternatives and risks of each were explained to the patient.  Written consent for procedure obtained. Time Out: Verified patient identification, verified procedure, site was marked, verified correct patient position, medications/allergies/relevent history reviewed.  The area was cleaned with alcohol swab.     The gluteus medius tendon insertion into the greater trochanter was targeted for Extracorporeal shockwave therapy.    Power Level: 140 Frequency: 10 Impulse/cycles: 2000 Head size: Large   Patient tolerated procedure well without immediate complications  This note was dictated using Dragon naturally speaking software and may contain errors in syntax, spelling, or content which have not been identified prior to signing this note.

## 2024-02-12 NOTE — Progress Notes (Signed)
 Remote PPM Transmission

## 2024-02-15 NOTE — Progress Notes (Unsigned)
 Cardiology Office Note Date:  02/15/2024  Patient ID:  Derrick Compton, Derrick Compton 08/03/1934, MRN [id number] PCP:  Dawna Domino, MD  Cardiologist:  Dr. Court Electrophysiologist: Dr. Kelsie >> Dr. cindie     Chief Complaint:   91 day post implant visit  History of Present Illness: Derrick Compton is a 89 y.o. male with history of  CAD,  AFib,  aortic aneurysm (followed at [facility]) CHB w/PPM  Seen previously by Dr. Kelsie, I have seen him in the past as well More recently  Saw B. Ollis, NP 04/09/23,  ILR noted on 02/16/23 at 1323 lasting 5 seconds > ECG with CHB with escape beats up to 8 sec pauses. He was asymptomatic with event. Dr. cindie aware and watchful waiting.  At his visit he reported recently started on new psychiatric medications and became very dizzy on Trintellix  and stopped it ~ 1.5 weeks ago with resolution of symptoms.  No changes made  Saw Dr. cindie Dec 2024, reported fluctuating HRs, some dizziness, feeling funny, discussed/planned for PPM for symptomatic bradycardia  PPM implanted 05/06/23,(loop removed) TOPROL  low dose was added for frequent PACs  Feb, patient called with erragtic HRs 37-40 as well as 120-130 via his watch Advised to stop the metoprolol   I saw him 08/07/23 Pacer functioning well Erratic HRs via his watch suspect 2/2 PACs, tech limitations Has been intolerant of BB with worsened symptoms of depression  No changes made  Saw Dr. Court 12/29/23, reported progressive SOB and CP, planned for echo and coronary CT.  Stable findings (noted below)  TODAY  He is doing well CP mostly noted with upper body exercises/activities like when he is tee-ing off/using his driver/golf. None with walking, hiking, ADLs, remains very active No palpitations, says those seemed to settle away, no longer gets fluctuating HRs, HR rises with exertion settles   He has previously mentioned that as he ages, he has developed some degree of depression, and admits to  being a self proclaimed hypochondriac!    AFib/AAD hx Diagnosed Jan 2021 Has sought second opinion at [facility] clinic  PVI ablation 11/10/2019, Dr. Kelsie Saw Dr. cindie Sept 2021 for watchman evaluation, pt wanting to come off a/c secondary to very active lifestyle, hiking/race care activities (?), felt to be a candidate for the procedure, pt was going to f/u with Dr. Kelsie and make a decision ILR implanted 03/28/2020  I do not find any AAD hx  Device  MDT LINQ II implanted 03/28/2020 for Afib surveillance MDT dual chamber PPM implanted 05/06/23 (loop removed)   Past Medical History:  Diagnosis Date   Anginal pain (HCC)    Anxiety    BPH (benign prostatic hyperplasia)    CAD (coronary artery disease)    Chest pain    Depression    Dysrhythmia    History of kidney stones    Hyperlipidemia    Osteopenia    Paroxysmal atrial fibrillation (HCC)    Pneumonia    Presence of permanent cardiac pacemaker     Past Surgical History:  Procedure Laterality Date   ATRIAL FIBRILLATION ABLATION N/A 11/10/2019   Procedure: ATRIAL FIBRILLATION ABLATION;  Surgeon: Kelsie Agent, MD;  Location: [facility] INVASIVE CV LAB;  Service: Cardiovascular;  Laterality: N/A;   COLON SURGERY     CYSTOSCOPY/URETEROSCOPY/HOLMIUM LASER/STENT PLACEMENT Left 08/24/2023   Procedure: CYSTOSCOPY/URETEROSCOPY/HOLMIUM LASER/STENT PLACEMENT/BILATERAL RETROGRADE PYELOGRAM;  Surgeon: Carolee Sherwood JONETTA DOUGLAS, MD;  Location: [facility] ORS;  Service: Urology;  Laterality: Left;  POSSIBLE BILATERAL RENAL STONES  HERNIA REPAIR     implantable loop recorder placement  03/28/2020    Medtronic Reveal Linq model LNQ 22 (SN [id number] ) implantable loop recorder implanted for afib management   LEFT HEART CATH AND CORONARY ANGIOGRAPHY N/A 02/05/2017   Procedure: LEFT HEART CATH AND CORONARY ANGIOGRAPHY;  Surgeon: Court Dorn PARAS, MD;  Location: [facility] INVASIVE CV LAB;  Service: Cardiovascular;  Laterality: N/A;   LOOP RECORDER REMOVAL Left  05/06/2023   Procedure: LOOP RECORDER REMOVAL;  Surgeon: Cindie Ole DASEN, MD;  [facility] INVAS[facility] CV LAB;  Service: Cardiovascular;  Laterality: Left;   PACEMAKER IMPLANT N/A 05/06/2023   Procedure: PACEMAKER IMPLANT;  Surgeon: Cindie Ole DASEN, MD;  Location: [facility] INVASIVE CV LAB;  Service: Cardiovascular;  Laterality: N/A;   TONSILLECTOMY      Current Outpatient Medications  Medication Sig Dispense Refill   apixaban  (ELIQUIS ) 5 MG TABS tablet Take 5 mg by mouth 2 (two) times daily.     Cholecalciferol 50 MCG (2000 UT) TABS 2,000 Units in the morning.     escitalopram (LEXAPRO) 20 MG tablet Take 20 mg by mouth daily.     Evolocumab  (REPATHA  SURECLICK) 140 MG/ML SOAJ INJECT 1 PEN INTO THE SKIN EVERY 14 (FOURTEEN) DAYS. 6 mL 3   finasteride  (PROSCAR ) 5 MG tablet Take 5 mg by mouth at bedtime.     LORazepam  (ATIVAN ) 0.5 MG tablet Take 1 tablet (0.5 mg total) by mouth 3 (three) times daily as needed for anxiety. 90 tablet 0   Magnesium 250 MG TABS Take 250 mg by mouth in the morning.     metoprolol  tartrate (LOPRESSOR ) 100 MG tablet Take 1 tablet (100mg ) TWO hours prior to CT scan 1 tablet 0   mirtazapine  (REMERON ) 30 MG tablet TAKE 1 TABLET BY MOUTH AT BEDTIME. 90 tablet 1   rosuvastatin  (CRESTOR ) 40 MG tablet TAKE 1 TABLET BY MOUTH EVERY DAY 90 tablet 3   No current facility-administered medications for this visit.    Allergies:   Niacin and Prednisone   Social History:  The patient  reports that he has never smoked. He has never used smokeless tobacco. He reports current alcohol use of about 2.0 standard drinks of alcohol per week. He reports that he does not use drugs.   Family History:  The patient's family history is not on file.  ROS:  Please see the history of present illness.    All other systems are reviewed and otherwise negative.   PHYSICAL EXAM:  VS:  There were no vitals taken for this visit. BMI: There is no height or weight on file to calculate BMI. Well  nourished, well developed, in no acute distress HEENT: normocephalic, atraumatic Neck: no JVD, carotid bruits or masses Cardiac:   RRR, no significant murmurs, no rubs, or gallops Lungs:  CTA b/l, no wheezing, rhonchi or rales Abd: soft, nontender MS: no deformity, age appropriate atrophy (thin) Ext: no edema Skin: warm and dry, no rash Neuro:  No gross deficits appreciated Psych: euthymic mood, full affect  PPM site is stable, no tethering or discomfort, no thinning or erosion   EKG:  not done today  Device interrogation done today and reviewed by myself:  battery and lead measurements stable No arrhythmias   02/04/24: TTE 1. Hypokinesis of the apical septum and apex, likely ppm related. Left  ventricular ejection fraction, by estimation, is 50 to 55%. The left  ventricle has low normal function. The left ventricle demonstrates  regional wall motion abnormalities (see  scoring diagram/findings for description). Left ventricular diastolic  parameters are consistent with Grade I diastolic dysfunction (impaired  relaxation). The average left ventricular global longitudinal strain is  -19.8 %. The global longitudinal strain  is normal.   2. Right ventricular systolic function is normal. The right ventricular  size is normal. Tricuspid regurgitation signal is inadequate for assessing  PA pressure.   3. The mitral valve is grossly normal. Trivial mitral valve  regurgitation. No evidence of mitral stenosis.   4. The aortic valve is tricuspid. Aortic valve regurgitation is trivial.  Aortic valve sclerosis is present, with no evidence of aortic valve  stenosis.   5. There is mild dilatation of the ascending aorta, measuring 42 mm.   6. The inferior vena cava is normal in size with greater than 50%  respiratory variability, suggesting right atrial pressure of 3 mmHg.   Comparison(s): No significant change from prior study   01/05/24: Coronary CTa FFR demonstrated D1 disease C/W  prior cath but otherwise no obstructive CAD  IMPRESSION: 1. Coronary calcium  score of 593. Age-, sex, and race-matched controls not applicable due to age.   2. Total plaque volume 608 mm3 which is 34th percentile for age- and sex-matched controls (calcified plaque 44 mm3; non-calcified plaque 564 mm3). TPV is severe.   3. Normal coronary origin with right dominance.   4. There is mild (25-49%) plaque in the mid LAD. There is moderate (50-69%) plaque in the ostium in D1. CAD-RADS 3. Will send for FFRct.   5.  Mild aortic valve atherosclerosis.   Cardiac PET 06/2022 > no ischemia, no infarction. Normal perfusion, intermediate risk due to borderline reduced myocardial blood flow reserve, LVEF 39% at rest / stress 40%. Moderate coronary calcifications   03/14/2021: TTE  1. Left ventricular ejection fraction, by estimation, is 50 to 55%. The  left ventricle has low normal function. The left ventricle has no regional  wall motion abnormalities. Left ventricular diastolic parameters are  consistent with Grade I diastolic  dysfunction (impaired relaxation).   2. Right ventricular systolic function is normal. The right ventricular  size is mildly enlarged.   3. Left atrial size was mildly dilated.   4. The mitral valve is normal in structure. No evidence of mitral valve  regurgitation. No evidence of mitral stenosis.   5. The aortic valve is tricuspid. There is moderate calcification of the  aortic valve. There is moderate thickening of the aortic valve. Aortic  valve regurgitation is not visualized. Aortic valve  sclerosis/calcification is present, without any evidence  of aortic stenosis. Aortic valve area, by VTI measures 2.76 cm. Aortic  valve mean gradient measures 5.2 mmHg. Aortic valve Vmax measures 1.58  m/s.   6. Aortic dilatation noted. There is mild dilatation of the ascending  aorta, measuring 42 mm.   7. The inferior vena cava is normal in size with greater than 50%   respiratory variability, suggesting right atrial pressure of 3 mmHg.   Comparison(s): Prior images reviewed side by side.   12/10/2019: EPS/ablation CONCLUSIONS: 1. Sinus rhythm upon presentation.   2. Intracardiac echo reveals a moderate sized left atrium with four separate pulmonary veins without evidence of pulmonary vein stenosis. 3. Successful electrical isolation and anatomical encircling of all four pulmonary veins with radiofrequency current. 4. No inducible arrhythmias following ablation both on and off of Isuprel  5. No early apparent complications.  02/05/2017: LHC Ost 1st Diag lesion, 95 %stenosed. The left ventricular systolic function is normal. LV end diastolic pressure  is normal. The left ventricular ejection fraction is 55-65% by visual estimate. IMPRESSION:Mr. Gerdeman has a high-grade ostial moderate size first diagonal branch stenosis. It is not ideal for stenting. The vessel comes off the 90 angle. He could potentially undergo cutting balloon atherectomy however given the relatively mild nature of his symptoms, the fact that he is on no antianginals and his Myoview  is negative I'm going to initially treat him medically. Should he feel optimal medical antianginal therapy I will put him back in intravenous first diagonal branch ostial lesion. The sheath was removed and a TR band was placed on the right wrist to achieve patent hemostasis. The patient left the lab in stable condition. I am going to begin  Dual antiplatelet therapy and we'll see him back in one week for follow-up.   Recent Labs: 08/19/2023: BUN 22; Creatinine, Ser 0.93; Hemoglobin 13.1; Platelets 195; Potassium 3.9; Sodium 140  No results found for requested labs within last 365 days.   CrCl cannot be calculated (Patient's most recent lab result is older than the maximum 21 days allowed.).   Wt Readings from Last 3 Encounters:  02/02/24 155 lb (70.3 kg)  01/26/24 155 lb (70.3 kg)  12/29/23 155 lb 12.8 oz  (70.7 kg)     Other studies reviewed: Additional studies/records reviewed today include: summarized above  ASSESSMENT AND PLAN:  PPM intact function no programming changes made  Paroxysmal Afib CHA2DS2Vasc is 3, on eliquis , appropriately dosed  zero  % burden  3.    CAD No anginal sounding symptoms Intolerant of BB with worsened symptoms of depression C/w Dr. Brenna  4. Ascending Ao aneurysm Follows with vascular MD at [facility]  5.  Secondary hypercoagulable state     Disposition: remotes as usual, back in clinic with EP in a year oitherwise, sooner if needed   Current medicines are reviewed at length with the patient today.  The patient did not have any concerns regarding medicines.  Bonney Charlies Arthur, PA-C 02/15/2024 5:51 AM     [facility] [address] [place] [address] [phone] (office)  [phone] (fax)

## 2024-02-16 ENCOUNTER — Ambulatory Visit (INDEPENDENT_AMBULATORY_CARE_PROVIDER_SITE_OTHER): Payer: Self-pay | Admitting: Sports Medicine

## 2024-02-16 ENCOUNTER — Ambulatory Visit: Attending: Physician Assistant | Admitting: Physician Assistant

## 2024-02-16 ENCOUNTER — Encounter: Payer: Self-pay | Admitting: Sports Medicine

## 2024-02-16 ENCOUNTER — Encounter: Payer: Self-pay | Admitting: Physician Assistant

## 2024-02-16 VITALS — BP 100/58 | HR 69 | Ht 70.0 in | Wt 155.4 lb

## 2024-02-16 VITALS — Ht 70.0 in | Wt 155.0 lb

## 2024-02-16 DIAGNOSIS — I25118 Atherosclerotic heart disease of native coronary artery with other forms of angina pectoris: Secondary | ICD-10-CM | POA: Diagnosis present

## 2024-02-16 DIAGNOSIS — D6869 Other thrombophilia: Secondary | ICD-10-CM | POA: Diagnosis present

## 2024-02-16 DIAGNOSIS — I480 Paroxysmal atrial fibrillation: Secondary | ICD-10-CM | POA: Diagnosis present

## 2024-02-16 DIAGNOSIS — Z950 Presence of cardiac pacemaker: Secondary | ICD-10-CM | POA: Diagnosis present

## 2024-02-16 DIAGNOSIS — M25551 Pain in right hip: Secondary | ICD-10-CM

## 2024-02-16 LAB — CUP PACEART INCLINIC DEVICE CHECK
Battery Remaining Longevity: 158 mo
Battery Voltage: 3.13 V
Brady Statistic AP VP Percent: 0.02 %
Brady Statistic AP VS Percent: 82.07 %
Brady Statistic AS VP Percent: 0.01 %
Brady Statistic AS VS Percent: 17.9 %
Brady Statistic RA Percent Paced: 81.27 %
Brady Statistic RV Percent Paced: 0.03 %
Date Time Interrogation Session: 20250923153414
Implantable Lead Connection Status: 753985
Implantable Lead Connection Status: 753985
Implantable Lead Implant Date: 20241211
Implantable Lead Implant Date: 20241211
Implantable Lead Location: 753859
Implantable Lead Location: 753860
Implantable Lead Model: 3830
Implantable Lead Model: 5076
Implantable Pulse Generator Implant Date: 20241211
Lead Channel Impedance Value: 323 Ohm
Lead Channel Impedance Value: 380 Ohm
Lead Channel Impedance Value: 475 Ohm
Lead Channel Impedance Value: 494 Ohm
Lead Channel Pacing Threshold Amplitude: 0.375 V
Lead Channel Pacing Threshold Amplitude: 1 V
Lead Channel Pacing Threshold Pulse Width: 0.4 ms
Lead Channel Pacing Threshold Pulse Width: 0.4 ms
Lead Channel Sensing Intrinsic Amplitude: 2.5 mV
Lead Channel Sensing Intrinsic Amplitude: 2.625 mV
Lead Channel Sensing Intrinsic Amplitude: 22.625 mV
Lead Channel Sensing Intrinsic Amplitude: 25.5 mV
Lead Channel Setting Pacing Amplitude: 1.5 V
Lead Channel Setting Pacing Amplitude: 2 V
Lead Channel Setting Pacing Pulse Width: 0.4 ms
Lead Channel Setting Sensing Sensitivity: 1.2 mV
Zone Setting Status: 755011

## 2024-02-16 NOTE — Progress Notes (Signed)
 Patient ID: Derrick Compton, male   DOB: 09-Sep-1934, 89 y.o.   MRN: [id number]   Derrick Compton presents today for his fourth soundwave treatment for his right hip greater trochanteric pain syndrome.  He is doing very well.  Pain is improving with both shockwave treatment and physical therapy.  Treatment performed as below.  He will continue with physical therapy and will follow-up again in 4 weeks.  Procedure: ECSWT Indications: Right hip greater trochanteric pain syndrome   Procedure Details Consent: Risks of procedure as well as the alternatives and risks of each were explained to the patient.  Written consent for procedure obtained. Time Out: Verified patient identification, verified procedure, site was marked, verified correct patient position, medications/allergies/relevent history reviewed.  The area was cleaned with alcohol swab.     The gluteus medius tendon insertion into the greater trochanteric was targeted for Extracorporeal shockwave therapy.    Power Level: 140 Frequency: 10 Impulse/cycles: 2000 Head size: Large   Patient tolerated procedure well without immediate complications    This note was dictated using Dragon naturally speaking software and may contain errors in syntax, spelling, or content which have not been identified prior to signing this note.

## 2024-02-16 NOTE — Patient Instructions (Signed)
 Medication Instructions:   Your physician recommends that you continue on your current medications as directed. Please refer to the Current Medication list given to you today.   *If you need a refill on your cardiac medications before your next appointment, please call your pharmacy*   Lab Work: NONE ORDERED  TODAY    If you have labs (blood work) drawn today and your tests are completely normal, you will receive your results only by: MyChart Message (if you have MyChart) OR A paper copy in the mail If you have any lab test that is abnormal or we need to change your treatment, we will call you to review the results.   Testing/Procedures: NONE ORDERED  TODAY      Follow-Up: At [facility], you and your health needs are our priority.  As part of our continuing mission to provide you with exceptional heart care, our providers are all part of one team.  This team includes your primary Cardiologist (physician) and Advanced Practice Providers or APPs (Physician Assistants and Nurse Practitioners) who all work together to provide you with the care you need, when you need it.  Your next appointment:    1 year(s)    Provider:    You may see Dr Cindie  or one of the following Advanced Practice Providers on your designated Care Team:   Charlies Arthur, [place]     We recommend signing up for the patient portal called MyChart.  Sign up information is provided on this After Visit Summary.  MyChart is used to connect with patients for Virtual Visits (Telemedicine).  Patients are able to view lab/test results, encounter notes, upcoming appointments, etc.  Non-urgent messages can be sent to your provider as well.   To learn more about what you can do with MyChart, go to [email or web address].   Other Instructions

## 2024-02-17 ENCOUNTER — Ambulatory Visit: Payer: Self-pay | Admitting: Cardiology

## 2024-02-29 ENCOUNTER — Ambulatory Visit: Payer: Medicare Other

## 2024-03-28 ENCOUNTER — Encounter: Payer: Self-pay | Admitting: Radiology

## 2024-03-30 ENCOUNTER — Encounter: Payer: Self-pay | Admitting: Cardiovascular Disease

## 2024-03-30 ENCOUNTER — Ambulatory Visit: Attending: Cardiology | Admitting: Cardiovascular Disease

## 2024-03-30 VITALS — BP 111/68 | HR 65 | Ht 70.0 in | Wt 155.6 lb

## 2024-03-30 DIAGNOSIS — Z950 Presence of cardiac pacemaker: Secondary | ICD-10-CM | POA: Insufficient documentation

## 2024-03-30 DIAGNOSIS — I25118 Atherosclerotic heart disease of native coronary artery with other forms of angina pectoris: Secondary | ICD-10-CM | POA: Diagnosis not present

## 2024-03-30 NOTE — Progress Notes (Signed)
 03/30/2024 Derrick Compton   1935-05-18  [id number]  Primary Physician Dawna Domino, MD Primary Cardiologist: Derrick JINNY Lesches MD GENI Derrick Compton  HPI:  Derrick Compton is a 89 y.o.   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 He was referred by the [facility] clinic for cardiovascular evaluation because of proximity and progressive chest pain. I last saw him in the office 12/29/2023.  His only cardiovascular risk factor is hyperlipidemia. He did have a coronary calcium  score performed 10/04/15 which was 181 a subsequent GXT which was normal. He exercises frequently has noticed progressive substernal chest pain with running. I perform Myoview  stress testing on 11/11/16 which was entirely normal 2-D echo performed 12/09/16 showed mild aortic stenosis. He studies continued symptoms I ordered a coronary CTA/FFR on 12/24/16 showed a significant lesion in a diagonal branch.   He underwent cardiac catheterization by myself 02/05/17 revealing a 90% ostial high first diagonal branch stenosis which came off orthogonal to the LAD making percutaneous intervention high risk. I do not think that the risk of intervention would be worth potential benefit of revascularization. The patient, because of his relative hypotension and bradycardia is not a good candidate for anginal medications.    He has developed A. fib seen on an event monitor 06/23/2019.  He did see Dr. Kelsie in the clinic for evaluation of this and was placed on Eliquis .  Over the last several months he is noticed increasing dyspnea on exertion as well as substernal chest pressure on exertion .  He also gets episodes related to fib several times a week with heart rates up to 150 which he can tell on his apple watch.  Due to these episodes he does get some substernal chest pain.  Recent coronary CTA performed 09/12/2019  revealed a coronary calcium  score of 217 without significant CAD.  He saw Dr. Kelsie in the office 09/19/2019 2 again discussed A. fib ablation which now he wishes to pursue.   He underwent successful A. fib ablation by Dr. Kelsie 11/10/2019.  He remains on Eliquis  oral anticoagulation and continues to maintain sinus rhythm.  He does get some effort angina when exercising since that time.  He did have a coronary CTA performed 09/12/2019 that only showed first diagonal branch ostial disease and otherwise no evidence of CAD, consistent with his heart cath performed 02/05/2017.  He had a cardiac PET study performed because of chest pain 07/23/2022 which was negative.  He had a permanent pacemaker implanted by Dr. Cindie December 24 because of complete heart block.  Over the last several months he has noted increasing shortness of breath and chest pain.  Since I saw him 3 months ago I did get a 2D echo which was unchanged from his prior study revealing normal LV systolic function with grade 1 diastolic dysfunction.  A coronary CTA revealed a coronary calcium  score of 593 with disease in the LAD and RCA although FFR analysis only suggested diagonal branch disease consistent with his known anatomy from cath 02/05/2017.  He said he can walk 4 miles without any symptoms although he does get some chest pain when he swings a golf club making me suspect that this may be musculoskeletal.  His most recent lipid profile performed a year ago revealed LDL 42 on Repatha  and rosuvastatin .   Current Meds  Medication Sig   apixaban  (ELIQUIS ) 5 MG TABS  tablet Take 5 mg by mouth 2 (two) times daily.   ARIPiprazole (ABILIFY) 5 MG tablet Take 5 mg by mouth daily.   Cholecalciferol 50 MCG (2000 UT) TABS 2,000 Units in the morning.   escitalopram (LEXAPRO) 20 MG tablet Take 20 mg by mouth daily.   Evolocumab  (REPATHA  SURECLICK) 140 MG/ML SOAJ INJECT 1 PEN INTO THE SKIN EVERY 14 (FOURTEEN) DAYS.   finasteride  (PROSCAR ) 5 MG  tablet Take 5 mg by mouth at bedtime.   LORazepam  (ATIVAN ) 0.5 MG tablet Take 1 tablet (0.5 mg total) by mouth 3 (three) times daily as needed for anxiety.   Magnesium 250 MG TABS Take 250 mg by mouth in the morning.   metoprolol  tartrate (LOPRESSOR ) 100 MG tablet Take 1 tablet (100mg ) TWO hours prior to CT scan   mirtazapine  (REMERON ) 30 MG tablet TAKE 1 TABLET BY MOUTH AT BEDTIME.   pantoprazole  (PROTONIX ) 40 MG tablet Take 40 mg by mouth as needed.   rosuvastatin  (CRESTOR ) 40 MG tablet TAKE 1 TABLET BY MOUTH EVERY DAY     Allergies  Allergen Reactions   Niacin Other (See Comments)    panic attack   Prednisone Other (See Comments)    Upset stomach     Social History   Socioeconomic History   Marital status: Married    Spouse name: Not on file   Number of children: Not on file   Years of education: Not on file   Highest education level: Not on file  Occupational History   Not on file  Tobacco Use   Smoking status: Never   Smokeless tobacco: Never  Vaping Use   Vaping status: Never Used  Substance and Sexual Activity   Alcohol use: Yes    Alcohol/week: 2.0 standard drinks of alcohol    Types: 2 Shots of liquor per week    Comment: occas.   Drug use: No   Sexual activity: Not on file  Other Topics Concern   Not on file  Social History Narrative   Lives in [place] with wife.      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   Social Drivers of Corporate Investment Banker Strain: Low Risk  (03/17/2024)   Received from [facility]   Overall Financial Resource Strain (CARDIA)    Difficulty of Paying Living Expenses: Not hard at all  Food Insecurity: No Food Insecurity (03/17/2024)   [facility] from [facility]   Hunger Vital Sign    Within the past 12 months, you worried that your food would run out before you got the money to buy more.: Never true    Within the past 12 months, the food you bought just didn't  last and you didn't have money to get more.: Never true  Transportation Needs: No Transportation Needs (03/17/2024)   Received from [facility] - Transportation    In the past 12 months, has lack of transportation kept you from medical appointments or from getting medications?: No    Lack of Transportation (Non-Medical): No  Physical Activity: Sufficiently Active (09/26/2019)   Received from [facility] visits prior to 07/26/2022.   Exercise Vital Sign    On average, how many days per week do you engage in moderate to strenuous exercise (like a brisk walk)?: 5 days    On average, how many minutes do you engage in exercise at this level?: 60 min  Stress: No  Stress Concern Present (11/13/2017)   Received from [facility] - Occupational Stress Questionnaire    Feeling of Stress : Only a little  Social Connections: Unknown (11/13/2017)   Received from [facility]   Social Connection and Isolation Panel    Frequency of Communication with Friends and Family: Not on file    Frequency of Social Gatherings with Friends and Family: Not on file    Attends Religious Services: Not on file    Active Member of Clubs or Organizations: Not on file    Attends Banker Meetings: Not on file    Marital Status: Married  Intimate Partner Violence: Unknown (05/07/2023)   Humiliation, Afraid, Rape, and Kick questionnaire    Fear of Current or Ex-Partner: No    Emotionally Abused: No    Physically Abused: Not on file    Sexually Abused: Not on file     Review of Systems: General: negative for chills, fever, night sweats or weight changes.  Cardiovascular: negative for chest pain, dyspnea on exertion, edema, orthopnea, palpitations, paroxysmal nocturnal dyspnea or shortness of breath Dermatological: negative for rash Respiratory: negative for cough or wheezing Urologic:  negative for hematuria Abdominal: negative for nausea, vomiting, diarrhea, bright red blood per rectum, melena, or hematemesis Neurologic: negative for visual changes, syncope, or dizziness All other systems reviewed and are otherwise negative except as noted above.    Blood pressure 111/68, pulse 65, height 5' 10 (1.778 m), weight 155 lb 9.6 oz (70.6 kg), SpO2 97%.  General appearance: alert and no distress Neck: no adenopathy, no carotid bruit, no JVD, supple, symmetrical, trachea midline, and thyroid not enlarged, symmetric, no tenderness/mass/nodules Lungs: clear to auscultation bilaterally Heart: regular rate and rhythm, S1, S2 normal, no murmur, click, rub or gallop Extremities: extremities normal, atraumatic, no cyanosis or edema Pulses: 2+ and symmetric Skin: Skin color, texture, turgor normal. No rashes or lesions Neurologic: Grossly normal  EKG EKG Interpretation Date/Time:  Wednesday March 30 2024 09:43:01 EST Ventricular Rate:  65 PR Interval:  218 QRS Duration:  146 QT Interval:  430 QTC Calculation: 447 R Axis:   -50  Text Interpretation: Atrial-paced rhythm with prolonged AV conduction Left axis deviation Left bundle branch block When compared with ECG of 07-Aug-2023 10:22, Premature atrial complexes are no longer Present Confirmed by Court Carrier [phone]) on 03/30/2024 10:09:11 AM    ASSESSMENT AND PLAN:   Coronary artery disease History of CAD status post cardiac catheterization by myself 02/05/2017 revealing first diagonal branch ostial disease.  The vessel came off at a 90 degree angle making intervention technically difficult.  Because of some chest pain and shortness of breath he did have a 2D echo performed 02/04/2024 which was unchanged from his last study revealing normal LV systolic function with grade 1 diastolic dysfunction.  A coronary CT revealed a coronary calcium  score of 593 with disease in his LAD and RCA although FFR analysis only suggested  diagonal branch disease consistent with his known anatomy.  He can walk 4 miles without any symptoms although he does get some symptoms when he swings his golf club making me think that this may be more musculoskeletal.  At this point, I am compelled to treat him medically and not performed stress testing or recommend coronary catheterization.     Carrier DOROTHA Court MD FACP,FACC,FAHA, [facility] 03/30/2024 10:16 AM

## 2024-03-30 NOTE — Patient Instructions (Signed)
 Medication Instructions:  Your physician recommends that you continue on your current medications as directed. Please refer to the Current Medication list given to you today.  *If you need a refill on your cardiac medications before your next appointment, please call your pharmacy*   Follow-Up: At [facility], you and your health needs are our priority.  As part of our continuing mission to provide you with exceptional heart care, our providers are all part of one team.  This team includes your primary Cardiologist (physician) and Advanced Practice Providers or APPs (Physician Assistants and Nurse Practitioners) who all work together to provide you with the care you need, when you need it.  Your next appointment:   6 month(s)  Provider:   Jon Hails, PA-C, Lum Louis, NP, Aline Door, PA-C, Kathleen Johnson, PA-C, Damien Braver, NP, or Katlyn West, NP         Then, Dorn Lesches, MD will plan to see you again in 12 month(s). {

## 2024-03-30 NOTE — Assessment & Plan Note (Signed)
 History of CAD status post cardiac catheterization by myself 02/05/2017 revealing first diagonal branch ostial disease.  The vessel came off at a 90 degree angle making intervention technically difficult.  Because of some chest pain and shortness of breath he did have a 2D echo performed 02/04/2024 which was unchanged from his last study revealing normal LV systolic function with grade 1 diastolic dysfunction.  A coronary CT revealed a coronary calcium  score of 593 with disease in his LAD and RCA although FFR analysis only suggested diagonal branch disease consistent with his known anatomy.  He can walk 4 miles without any symptoms although he does get some symptoms when he swings his golf club making me think that this may be more musculoskeletal.  At this point, I am compelled to treat him medically and not performed stress testing or recommend coronary catheterization.

## 2024-04-14 ENCOUNTER — Encounter: Payer: Self-pay | Admitting: Cardiology

## 2024-04-19 NOTE — Telephone Encounter (Signed)
 Called patient when did not get transmission today.   He states he didn't know how to send one and would like for us  to call him back in the morning BEFORE 930am and talk him through how to send it.   Forwarding to [facility] - CMA remote specialist to call him and assist first thing in the morning and have triage nurse team review and follow up with patient.

## 2024-04-20 ENCOUNTER — Telehealth: Payer: Self-pay

## 2024-04-20 NOTE — Telephone Encounter (Signed)
 Spoke with pt he will not be able to send a transmission today before we close but will be able to send one later this evening when he gets home. Pt was told how to send it and will do it. Pt verbalized understanding

## 2024-04-20 NOTE — Telephone Encounter (Signed)
 CAlled patient to advise we will be closed the next 4 days so if any symptoms occur such as lightheadedness, dizziness or syncope go to the ER. Do not drive at that point.   Patient voiced understanding and appreciative of call.

## 2024-04-25 NOTE — Telephone Encounter (Signed)
 Remote transmission received. Normal device function. Presenting rhythm AS/AP bigeminy. No alerts triggered. Lead trends appear stable. See mychart message 04/25/24.

## 2024-05-05 ENCOUNTER — Ambulatory Visit: Payer: Medicare Other

## 2024-05-05 DIAGNOSIS — I480 Paroxysmal atrial fibrillation: Secondary | ICD-10-CM

## 2024-05-06 LAB — CUP PACEART REMOTE DEVICE CHECK
Battery Remaining Longevity: 152 mo
Battery Voltage: 3.09 V
Brady Statistic AP VP Percent: 0.02 %
Brady Statistic AP VS Percent: 73.02 %
Brady Statistic AS VP Percent: 0.01 %
Brady Statistic AS VS Percent: 26.94 %
Brady Statistic RA Percent Paced: 72.13 %
Brady Statistic RV Percent Paced: 0.03 %
Date Time Interrogation Session: 20251211154135
Implantable Lead Connection Status: 753985
Implantable Lead Connection Status: 753985
Implantable Lead Implant Date: 20241211
Implantable Lead Implant Date: 20241211
Implantable Lead Location: 753859
Implantable Lead Location: 753860
Implantable Lead Model: 3830
Implantable Lead Model: 5076
Implantable Pulse Generator Implant Date: 20241211
Lead Channel Impedance Value: 285 Ohm
Lead Channel Impedance Value: 342 Ohm
Lead Channel Impedance Value: 380 Ohm
Lead Channel Impedance Value: 475 Ohm
Lead Channel Pacing Threshold Amplitude: 0.5 V
Lead Channel Pacing Threshold Amplitude: 1.125 V
Lead Channel Pacing Threshold Pulse Width: 0.4 ms
Lead Channel Pacing Threshold Pulse Width: 0.4 ms
Lead Channel Sensing Intrinsic Amplitude: 2.25 mV
Lead Channel Sensing Intrinsic Amplitude: 2.25 mV
Lead Channel Sensing Intrinsic Amplitude: 22.75 mV
Lead Channel Sensing Intrinsic Amplitude: 22.75 mV
Lead Channel Setting Pacing Amplitude: 1.5 V
Lead Channel Setting Pacing Amplitude: 2.25 V
Lead Channel Setting Pacing Pulse Width: 0.4 ms
Lead Channel Setting Sensing Sensitivity: 1.2 mV
Zone Setting Status: 755011

## 2024-05-12 NOTE — Progress Notes (Signed)
 Remote PPM Transmission

## 2024-05-23 ENCOUNTER — Ambulatory Visit: Payer: Self-pay | Admitting: Cardiovascular Disease

## 2024-06-14 ENCOUNTER — Ambulatory Visit: Attending: Student | Admitting: Student

## 2024-06-14 ENCOUNTER — Encounter: Payer: Self-pay | Admitting: Student

## 2024-06-14 VITALS — BP 110/62 | HR 70 | Ht 70.0 in | Wt 156.0 lb

## 2024-06-14 DIAGNOSIS — I493 Ventricular premature depolarization: Secondary | ICD-10-CM | POA: Diagnosis not present

## 2024-06-14 DIAGNOSIS — I442 Atrioventricular block, complete: Secondary | ICD-10-CM | POA: Diagnosis not present

## 2024-06-14 DIAGNOSIS — Z950 Presence of cardiac pacemaker: Secondary | ICD-10-CM | POA: Diagnosis not present

## 2024-06-14 DIAGNOSIS — I480 Paroxysmal atrial fibrillation: Secondary | ICD-10-CM | POA: Diagnosis not present

## 2024-06-14 LAB — CUP PACEART INCLINIC DEVICE CHECK
Battery Remaining Longevity: 152 mo
Battery Voltage: 3.06 V
Brady Statistic AP VP Percent: 0.03 %
Brady Statistic AP VS Percent: 77.43 %
Brady Statistic AS VP Percent: 0.01 %
Brady Statistic AS VS Percent: 22.54 %
Brady Statistic RA Percent Paced: 76.61 %
Brady Statistic RV Percent Paced: 0.04 %
Date Time Interrogation Session: 20260120090710
Implantable Lead Connection Status: 753985
Implantable Lead Connection Status: 753985
Implantable Lead Implant Date: 20241211
Implantable Lead Implant Date: 20241211
Implantable Lead Location: 753859
Implantable Lead Location: 753860
Implantable Lead Model: 3830
Implantable Lead Model: 5076
Implantable Pulse Generator Implant Date: 20241211
Lead Channel Impedance Value: 304 Ohm
Lead Channel Impedance Value: 361 Ohm
Lead Channel Impedance Value: 418 Ohm
Lead Channel Impedance Value: 494 Ohm
Lead Channel Pacing Threshold Amplitude: 0.5 V
Lead Channel Pacing Threshold Amplitude: 1.125 V
Lead Channel Pacing Threshold Pulse Width: 0.4 ms
Lead Channel Pacing Threshold Pulse Width: 0.4 ms
Lead Channel Sensing Intrinsic Amplitude: 2.25 mV
Lead Channel Sensing Intrinsic Amplitude: 2.375 mV
Lead Channel Sensing Intrinsic Amplitude: 25.25 mV
Lead Channel Sensing Intrinsic Amplitude: 25.25 mV
Lead Channel Setting Pacing Amplitude: 1.5 V
Lead Channel Setting Pacing Amplitude: 2.25 V
Lead Channel Setting Pacing Pulse Width: 0.4 ms
Lead Channel Setting Sensing Sensitivity: 1.2 mV
Zone Setting Status: 755011

## 2024-06-14 NOTE — Progress Notes (Signed)
" °  Electrophysiology Office Note:   ID:  Derrick Compton, Derrick Compton Aug 22, 1934, MRN [id number]  Primary Cardiologist: Dorn Lesches, MD Electrophysiologist: Eulas FORBES Furbish, MD      History of Present Illness:   Derrick Compton is a 89 y.o. male with h/o CAD, AF, Aortic aneurysm, and intermittent CHB s/p PPM seen today for routine electrophysiology followup.   Since last being seen in our clinic the patient reports doing OK. He has noted on his apple watch low and high HRs. Occasional dot on HR plot in 40s, and at times HRs with exercise recorded as 140-150s. Asymptomatic, doing the things he wants to do and volunteering at [facility]. Overall, he denies chest pain, palpitations, dyspnea, PND, orthopnea, nausea, vomiting, dizziness, syncope, edema, weight gain, or early satiety.   Review of systems complete and found to be negative unless listed in HPI.   EP Information / Studies Reviewed:    EKG is ordered today. Personal review as below.  EKG Interpretation Date/Time:  Tuesday June 14 2024 08:32:46 EST Ventricular Rate:  70 PR Interval:  214 QRS Duration:  146 QT Interval:  414 QTC Calculation: 447 R Axis:   -16  Text Interpretation: Atrial-paced rhythm with prolonged AV conduction Left bundle branch block Atrial premature contraction Confirmed by Lesia Heck ([id number]) on 06/14/2024 8:40:51 AM    PPM Interrogation-  reviewed in detail today,  See PACEART report.  Arrhythmia/Device History S/p PVI 10/2019 MDT LINQ 03/28/2020 for Afib surveillance (explanted) MDT dual chamber PPM implanted 05/06/23 for intermittent CHB    Physical Exam:   VS:  BP 110/62   Pulse 70   Ht 5' 10 (1.778 m)   Wt 156 lb (70.8 kg)   SpO2 96%   BMI 22.38 kg/m    Wt Readings from Last 3 Encounters:  06/14/24 156 lb (70.8 kg)  03/30/24 155 lb 9.6 oz (70.6 kg)  02/16/24 155 lb 6.4 oz (70.5 kg)     GEN: No acute distress  NECK: No JVD; No carotid bruits CARDIAC: Regular rate and rhythm, no murmurs,  rubs, gallops RESPIRATORY:  Clear to auscultation without rales, wheezing or rhonchi  ABDOMEN: Soft, non-tender, non-distended EXTREMITIES:  No edema; No deformity   ASSESSMENT AND PLAN:    Intermittent CHB s/p Medtronic PPM  Normal PPM function See Pace Art report No changes today Device shows lowest HR as 60 and highest in 120s. No concerns.  Reassurance given  Paroxysmal AF Secondary hypercoagulable state 0% burden by device Continue eliquis  5 mg BID for CHA2DS2/VASc of at least 3  HTN Stable on current regimen    Disposition:   Follow up with EP Team in 12 months  Signed, Ozell Prentice Lesia, PA-C  "

## 2024-06-14 NOTE — Patient Instructions (Signed)
 Medication Instructions:  No medication changes today. *If you need a refill on your cardiac medications before your next appointment, please call your pharmacy*  Lab Work: No labwork ordered today. If you have labs (blood work) drawn today and your tests are completely normal, you will receive your results only by: MyChart Message (if you have MyChart) OR A paper copy in the mail If you have any lab test that is abnormal or we need to change your treatment, we will call you to review the results.  Testing/Procedures: No testing ordered today  Follow-Up: At [facility], you and your health needs are our priority.  As part of our continuing mission to provide you with exceptional heart care, our providers are all part of one team.  This team includes your primary Cardiologist (physician) and Advanced Practice Providers or APPs (Physician Assistants and Nurse Practitioners) who all work together to provide you with the care you need, when you need it.  Your next appointment:   12 month(s)  Provider:   You may see Efraim Grange, MD or one of the following Advanced Practice Providers on your designated Care Team:   Mertha Abrahams, [place] [address] [place], PA-C Suzann Riddle, NP Creighton Doffing, NP    We recommend signing up for the patient portal called "MyChart".  Sign up information is provided on this After Visit Summary.  MyChart is used to connect with patients for Virtual Visits (Telemedicine).  Patients are able to view lab/test results, encounter notes, upcoming appointments, etc.  Non-urgent messages can be sent to your provider as well.   To learn more about what you can do with MyChart, go to [email or web address].

## 2024-06-15 ENCOUNTER — Other Ambulatory Visit: Payer: Self-pay | Admitting: Medical Genetics

## 2024-06-16 ENCOUNTER — Other Ambulatory Visit (HOSPITAL_COMMUNITY)
Admission: RE | Admit: 2024-06-16 | Discharge: 2024-06-16 | Disposition: A | Discharge status: Home / Self Care | Payer: Self-pay | Source: Ambulatory Visit | Attending: Medical Genetics | Admitting: Medical Genetics

## 2024-06-21 ENCOUNTER — Ambulatory Visit: Payer: Self-pay | Admitting: Cardiovascular Disease

## 2024-06-26 LAB — GENECONNECT MOLECULAR SCREEN: Genetic Analysis Overall Interpretation: NEGATIVE
